# Patient Record
Sex: Female | Born: 1935 | Race: Black or African American | Hispanic: No | Smoking: Never smoker
Health system: Southern US, Community
[De-identification: ages and names within clinical notes are randomized; demographics above are authoritative.]

## PROBLEM LIST (undated history)

## (undated) DIAGNOSIS — J309 Allergic rhinitis, unspecified: Secondary | ICD-10-CM

## (undated) DIAGNOSIS — M1990 Unspecified osteoarthritis, unspecified site: Secondary | ICD-10-CM

## (undated) DIAGNOSIS — E119 Type 2 diabetes mellitus without complications: Secondary | ICD-10-CM

## (undated) DIAGNOSIS — K649 Unspecified hemorrhoids: Secondary | ICD-10-CM

## (undated) DIAGNOSIS — I2510 Atherosclerotic heart disease of native coronary artery without angina pectoris: Secondary | ICD-10-CM

## (undated) DIAGNOSIS — G609 Hereditary and idiopathic neuropathy, unspecified: Secondary | ICD-10-CM

## (undated) DIAGNOSIS — I739 Peripheral vascular disease, unspecified: Secondary | ICD-10-CM

## (undated) DIAGNOSIS — G5600 Carpal tunnel syndrome, unspecified upper limb: Secondary | ICD-10-CM

## (undated) DIAGNOSIS — I82409 Acute embolism and thrombosis of unspecified deep veins of unspecified lower extremity: Secondary | ICD-10-CM

## (undated) DIAGNOSIS — E785 Hyperlipidemia, unspecified: Secondary | ICD-10-CM

## (undated) DIAGNOSIS — Z86010 Personal history of colonic polyps: Secondary | ICD-10-CM

## (undated) DIAGNOSIS — G4700 Insomnia, unspecified: Secondary | ICD-10-CM

## (undated) DIAGNOSIS — D509 Iron deficiency anemia, unspecified: Secondary | ICD-10-CM

## (undated) DIAGNOSIS — I639 Cerebral infarction, unspecified: Secondary | ICD-10-CM

## (undated) DIAGNOSIS — E538 Deficiency of other specified B group vitamins: Secondary | ICD-10-CM

## (undated) DIAGNOSIS — Z947 Corneal transplant status: Secondary | ICD-10-CM

## (undated) DIAGNOSIS — I10 Essential (primary) hypertension: Secondary | ICD-10-CM

## (undated) DIAGNOSIS — E049 Nontoxic goiter, unspecified: Secondary | ICD-10-CM

## (undated) DIAGNOSIS — K5730 Diverticulosis of large intestine without perforation or abscess without bleeding: Secondary | ICD-10-CM

## (undated) HISTORY — DX: Essential (primary) hypertension: I10

## (undated) HISTORY — DX: Peripheral vascular disease, unspecified: I73.9

## (undated) HISTORY — DX: Allergic rhinitis, unspecified: J30.9

## (undated) HISTORY — DX: Atherosclerotic heart disease of native coronary artery without angina pectoris: I25.10

## (undated) HISTORY — DX: Personal history of colonic polyps: Z86.010

## (undated) HISTORY — DX: Unspecified hemorrhoids: K64.9

## (undated) HISTORY — DX: Hyperlipidemia, unspecified: E78.5

## (undated) HISTORY — DX: Diverticulosis of large intestine without perforation or abscess without bleeding: K57.30

## (undated) HISTORY — DX: Hereditary and idiopathic neuropathy, unspecified: G60.9

## (undated) HISTORY — PX: CATARACT EXTRACTION, BILATERAL: SHX1313

## (undated) HISTORY — PX: CARDIAC CATHETERIZATION: SHX172

## (undated) HISTORY — DX: Corneal transplant status: Z94.7

## (undated) HISTORY — PX: CHOLECYSTECTOMY: SHX55

## (undated) HISTORY — PX: APPENDECTOMY: SHX54

## (undated) HISTORY — DX: Insomnia, unspecified: G47.00

## (undated) HISTORY — DX: Iron deficiency anemia, unspecified: D50.9

## (undated) HISTORY — DX: Unspecified osteoarthritis, unspecified site: M19.90

## (undated) HISTORY — DX: Carpal tunnel syndrome, unspecified upper limb: G56.00

## (undated) HISTORY — DX: Deficiency of other specified B group vitamins: E53.8

## (undated) HISTORY — DX: Nontoxic goiter, unspecified: E04.9

---

## 1958-11-11 HISTORY — PX: ABDOMINAL HYSTERECTOMY: SHX81

## 1987-03-13 HISTORY — PX: BELOW KNEE LEG AMPUTATION: SUR23

## 1999-05-09 ENCOUNTER — Inpatient Hospital Stay (HOSPITAL_COMMUNITY): Admission: EM | Admit: 1999-05-09 | Discharge: 1999-05-10 | Payer: Self-pay | Admitting: Emergency Medicine

## 1999-07-04 ENCOUNTER — Ambulatory Visit: Admission: RE | Admit: 1999-07-04 | Discharge: 1999-07-04 | Payer: Self-pay | Admitting: Endocrinology

## 1999-07-12 ENCOUNTER — Encounter: Admission: RE | Admit: 1999-07-12 | Discharge: 1999-10-10 | Payer: Self-pay | Admitting: Internal Medicine

## 1999-11-28 ENCOUNTER — Encounter: Admission: RE | Admit: 1999-11-28 | Discharge: 2000-02-26 | Payer: Self-pay | Admitting: Orthopedic Surgery

## 2000-03-08 ENCOUNTER — Encounter: Admission: RE | Admit: 2000-03-08 | Discharge: 2000-04-22 | Payer: Self-pay | Admitting: Anesthesiology

## 2000-11-06 ENCOUNTER — Other Ambulatory Visit: Admission: RE | Admit: 2000-11-06 | Discharge: 2000-11-06 | Payer: Self-pay | Admitting: Gastroenterology

## 2000-11-06 ENCOUNTER — Encounter (INDEPENDENT_AMBULATORY_CARE_PROVIDER_SITE_OTHER): Payer: Self-pay | Admitting: *Deleted

## 2000-11-19 ENCOUNTER — Inpatient Hospital Stay (HOSPITAL_COMMUNITY): Admission: AD | Admit: 2000-11-19 | Discharge: 2000-11-22 | Payer: Self-pay | Admitting: Gastroenterology

## 2000-11-20 ENCOUNTER — Encounter: Payer: Self-pay | Admitting: Gastroenterology

## 2000-11-27 ENCOUNTER — Inpatient Hospital Stay (HOSPITAL_COMMUNITY): Admission: EM | Admit: 2000-11-27 | Discharge: 2000-12-03 | Payer: Self-pay

## 2000-11-27 ENCOUNTER — Encounter: Payer: Self-pay | Admitting: Internal Medicine

## 2000-11-28 ENCOUNTER — Encounter: Payer: Self-pay | Admitting: Internal Medicine

## 2000-11-30 ENCOUNTER — Encounter: Payer: Self-pay | Admitting: Internal Medicine

## 2000-12-02 ENCOUNTER — Encounter: Payer: Self-pay | Admitting: Internal Medicine

## 2001-01-07 ENCOUNTER — Encounter: Admission: RE | Admit: 2001-01-07 | Discharge: 2001-03-11 | Payer: Self-pay | Admitting: Orthopedic Surgery

## 2001-01-17 ENCOUNTER — Emergency Department (HOSPITAL_COMMUNITY): Admission: EM | Admit: 2001-01-17 | Discharge: 2001-01-17 | Payer: Self-pay | Admitting: *Deleted

## 2001-02-02 ENCOUNTER — Encounter: Payer: Self-pay | Admitting: Emergency Medicine

## 2001-02-02 ENCOUNTER — Encounter: Payer: Self-pay | Admitting: Internal Medicine

## 2001-02-02 ENCOUNTER — Inpatient Hospital Stay (HOSPITAL_COMMUNITY): Admission: EM | Admit: 2001-02-02 | Discharge: 2001-02-08 | Payer: Self-pay | Admitting: Emergency Medicine

## 2001-02-06 ENCOUNTER — Encounter: Payer: Self-pay | Admitting: Internal Medicine

## 2001-03-12 HISTORY — PX: CORONARY ARTERY BYPASS GRAFT: SHX141

## 2001-06-01 ENCOUNTER — Inpatient Hospital Stay (HOSPITAL_COMMUNITY): Admission: EM | Admit: 2001-06-01 | Discharge: 2001-07-02 | Payer: Self-pay | Admitting: *Deleted

## 2001-06-01 ENCOUNTER — Encounter: Payer: Self-pay | Admitting: *Deleted

## 2001-06-10 ENCOUNTER — Encounter: Payer: Self-pay | Admitting: Cardiology

## 2001-06-13 ENCOUNTER — Encounter: Payer: Self-pay | Admitting: Thoracic Surgery (Cardiothoracic Vascular Surgery)

## 2001-06-17 ENCOUNTER — Encounter: Payer: Self-pay | Admitting: Thoracic Surgery (Cardiothoracic Vascular Surgery)

## 2001-06-18 ENCOUNTER — Encounter: Payer: Self-pay | Admitting: Thoracic Surgery (Cardiothoracic Vascular Surgery)

## 2001-06-19 ENCOUNTER — Encounter: Payer: Self-pay | Admitting: Thoracic Surgery (Cardiothoracic Vascular Surgery)

## 2001-06-20 ENCOUNTER — Encounter: Payer: Self-pay | Admitting: Thoracic Surgery (Cardiothoracic Vascular Surgery)

## 2001-06-21 ENCOUNTER — Encounter: Payer: Self-pay | Admitting: Thoracic Surgery (Cardiothoracic Vascular Surgery)

## 2001-06-21 ENCOUNTER — Encounter: Payer: Self-pay | Admitting: Cardiothoracic Surgery

## 2001-06-22 ENCOUNTER — Encounter: Payer: Self-pay | Admitting: Cardiothoracic Surgery

## 2001-06-23 ENCOUNTER — Encounter: Payer: Self-pay | Admitting: Cardiothoracic Surgery

## 2001-06-24 ENCOUNTER — Encounter: Payer: Self-pay | Admitting: Cardiothoracic Surgery

## 2001-06-25 ENCOUNTER — Encounter: Payer: Self-pay | Admitting: Thoracic Surgery (Cardiothoracic Vascular Surgery)

## 2001-06-29 ENCOUNTER — Encounter: Payer: Self-pay | Admitting: Thoracic Surgery (Cardiothoracic Vascular Surgery)

## 2001-08-22 ENCOUNTER — Inpatient Hospital Stay (HOSPITAL_COMMUNITY)
Admission: EM | Admit: 2001-08-22 | Discharge: 2001-09-03 | Payer: Self-pay | Admitting: Thoracic Surgery (Cardiothoracic Vascular Surgery)

## 2004-03-31 ENCOUNTER — Ambulatory Visit: Payer: Self-pay | Admitting: Endocrinology

## 2004-05-22 ENCOUNTER — Ambulatory Visit: Payer: Self-pay | Admitting: Endocrinology

## 2004-05-27 ENCOUNTER — Emergency Department (HOSPITAL_COMMUNITY): Admission: EM | Admit: 2004-05-27 | Discharge: 2004-05-27 | Payer: Self-pay | Admitting: Emergency Medicine

## 2004-08-16 ENCOUNTER — Ambulatory Visit: Payer: Self-pay | Admitting: Endocrinology

## 2004-08-21 ENCOUNTER — Observation Stay (HOSPITAL_COMMUNITY): Admission: EM | Admit: 2004-08-21 | Discharge: 2004-08-23 | Payer: Self-pay | Admitting: Emergency Medicine

## 2004-08-22 ENCOUNTER — Ambulatory Visit: Payer: Self-pay | Admitting: Internal Medicine

## 2004-09-06 ENCOUNTER — Ambulatory Visit: Payer: Self-pay | Admitting: Endocrinology

## 2004-10-12 ENCOUNTER — Ambulatory Visit: Payer: Self-pay | Admitting: Internal Medicine

## 2004-12-27 ENCOUNTER — Ambulatory Visit: Payer: Self-pay | Admitting: Endocrinology

## 2005-01-03 ENCOUNTER — Ambulatory Visit: Payer: Self-pay | Admitting: Endocrinology

## 2005-02-14 ENCOUNTER — Ambulatory Visit: Payer: Self-pay | Admitting: Endocrinology

## 2005-04-03 ENCOUNTER — Ambulatory Visit: Payer: Self-pay | Admitting: Endocrinology

## 2005-07-05 ENCOUNTER — Ambulatory Visit: Payer: Self-pay | Admitting: Internal Medicine

## 2005-10-04 ENCOUNTER — Ambulatory Visit: Payer: Self-pay | Admitting: Internal Medicine

## 2005-10-12 ENCOUNTER — Encounter: Payer: Self-pay | Admitting: Cardiology

## 2005-10-12 ENCOUNTER — Ambulatory Visit: Payer: Self-pay

## 2005-11-29 ENCOUNTER — Ambulatory Visit: Payer: Self-pay | Admitting: Internal Medicine

## 2005-12-21 ENCOUNTER — Ambulatory Visit: Payer: Self-pay | Admitting: Endocrinology

## 2006-03-19 ENCOUNTER — Ambulatory Visit: Payer: Self-pay | Admitting: Endocrinology

## 2006-03-19 LAB — CONVERTED CEMR LAB
BUN: 18 mg/dL (ref 6–23)
CO2: 23 meq/L (ref 19–32)
Chloride: 108 meq/L (ref 96–112)
Cholesterol: 127 mg/dL (ref 0–200)
Creatinine, Ser: 1.4 mg/dL — ABNORMAL HIGH (ref 0.4–1.2)
Creatinine,U: 157.3 mg/dL
GFR calc non Af Amer: 39 mL/min
Glomerular Filtration Rate, Af Am: 47 mL/min/{1.73_m2}
HDL: 52.1 mg/dL (ref >39.0)
Hemoglobin, Urine: NEGATIVE
Hemoglobin: 13.5 g/dL (ref 12.0–15.0)
Hgb A1c MFr Bld: 7.1 % — ABNORMAL HIGH (ref 4.6–6.0)
MCV: 93.1 fL (ref 78.0–100.0)
Microalb Creat Ratio: 57.9 mg/g — ABNORMAL HIGH (ref 0.0–30.0)
Platelets: 209 10*3/uL (ref 150–400)
RBC: 4.35 M/uL (ref 3.87–5.11)
Saturation Ratios: 16.6 % — ABNORMAL LOW (ref 20.0–50.0)
Sodium: 142 meq/L (ref 135–145)
TSH: 1.79 microintl units/mL (ref 0.35–5.50)
Triglyceride fasting, serum: 112 mg/dL (ref 0–149)
Urobilinogen, UA: 0.2 (ref 0.0–1.0)
VLDL: 22 mg/dL (ref 0–40)

## 2006-05-13 ENCOUNTER — Ambulatory Visit: Payer: Self-pay | Admitting: Internal Medicine

## 2006-05-22 ENCOUNTER — Ambulatory Visit: Payer: Self-pay | Admitting: Internal Medicine

## 2006-06-27 ENCOUNTER — Ambulatory Visit: Payer: Self-pay | Admitting: Endocrinology

## 2006-07-31 ENCOUNTER — Ambulatory Visit: Payer: Self-pay | Admitting: Endocrinology

## 2006-09-30 ENCOUNTER — Ambulatory Visit: Payer: Self-pay | Admitting: Gastroenterology

## 2006-10-28 ENCOUNTER — Ambulatory Visit: Payer: Self-pay | Admitting: Endocrinology

## 2006-11-13 ENCOUNTER — Ambulatory Visit: Payer: Self-pay | Admitting: Gastroenterology

## 2006-11-13 ENCOUNTER — Encounter: Payer: Self-pay | Admitting: Gastroenterology

## 2006-11-13 LAB — HM COLONOSCOPY

## 2006-12-30 ENCOUNTER — Encounter: Payer: Self-pay | Admitting: Endocrinology

## 2006-12-30 DIAGNOSIS — G5600 Carpal tunnel syndrome, unspecified upper limb: Secondary | ICD-10-CM

## 2006-12-30 DIAGNOSIS — Z860100 Personal history of colon polyps, unspecified: Secondary | ICD-10-CM

## 2006-12-30 DIAGNOSIS — K5730 Diverticulosis of large intestine without perforation or abscess without bleeding: Secondary | ICD-10-CM | POA: Insufficient documentation

## 2006-12-30 DIAGNOSIS — I2510 Atherosclerotic heart disease of native coronary artery without angina pectoris: Secondary | ICD-10-CM

## 2006-12-30 DIAGNOSIS — Z947 Corneal transplant status: Secondary | ICD-10-CM | POA: Insufficient documentation

## 2006-12-30 DIAGNOSIS — I739 Peripheral vascular disease, unspecified: Secondary | ICD-10-CM

## 2006-12-30 DIAGNOSIS — E049 Nontoxic goiter, unspecified: Secondary | ICD-10-CM

## 2006-12-30 DIAGNOSIS — I10 Essential (primary) hypertension: Secondary | ICD-10-CM

## 2006-12-30 DIAGNOSIS — E785 Hyperlipidemia, unspecified: Secondary | ICD-10-CM

## 2006-12-30 DIAGNOSIS — H269 Unspecified cataract: Secondary | ICD-10-CM

## 2006-12-30 DIAGNOSIS — G609 Hereditary and idiopathic neuropathy, unspecified: Secondary | ICD-10-CM

## 2006-12-30 DIAGNOSIS — S88119A Complete traumatic amputation at level between knee and ankle, unspecified lower leg, initial encounter: Secondary | ICD-10-CM | POA: Insufficient documentation

## 2006-12-30 DIAGNOSIS — J309 Allergic rhinitis, unspecified: Secondary | ICD-10-CM

## 2006-12-30 DIAGNOSIS — K649 Unspecified hemorrhoids: Secondary | ICD-10-CM

## 2006-12-30 HISTORY — DX: Unspecified hemorrhoids: K64.9

## 2006-12-30 HISTORY — DX: Allergic rhinitis, unspecified: J30.9

## 2006-12-30 HISTORY — DX: Corneal transplant status: Z94.7

## 2006-12-30 HISTORY — DX: Hyperlipidemia, unspecified: E78.5

## 2006-12-30 HISTORY — DX: Carpal tunnel syndrome, unspecified upper limb: G56.00

## 2006-12-30 HISTORY — DX: Diverticulosis of large intestine without perforation or abscess without bleeding: K57.30

## 2006-12-30 HISTORY — DX: Personal history of colonic polyps: Z86.010

## 2006-12-30 HISTORY — DX: Nontoxic goiter, unspecified: E04.9

## 2006-12-30 HISTORY — DX: Personal history of colon polyps, unspecified: Z86.0100

## 2006-12-30 HISTORY — DX: Essential (primary) hypertension: I10

## 2006-12-30 HISTORY — DX: Peripheral vascular disease, unspecified: I73.9

## 2006-12-30 HISTORY — DX: Hereditary and idiopathic neuropathy, unspecified: G60.9

## 2006-12-30 HISTORY — DX: Atherosclerotic heart disease of native coronary artery without angina pectoris: I25.10

## 2007-01-09 ENCOUNTER — Ambulatory Visit: Payer: Self-pay | Admitting: Internal Medicine

## 2007-01-16 ENCOUNTER — Emergency Department (HOSPITAL_COMMUNITY): Admission: EM | Admit: 2007-01-16 | Discharge: 2007-01-16 | Payer: Self-pay | Admitting: Emergency Medicine

## 2007-02-10 ENCOUNTER — Ambulatory Visit: Payer: Self-pay | Admitting: Endocrinology

## 2007-02-10 DIAGNOSIS — M1990 Unspecified osteoarthritis, unspecified site: Secondary | ICD-10-CM | POA: Insufficient documentation

## 2007-02-10 HISTORY — DX: Unspecified osteoarthritis, unspecified site: M19.90

## 2007-02-27 ENCOUNTER — Ambulatory Visit: Payer: Self-pay | Admitting: Endocrinology

## 2007-02-28 LAB — CONVERTED CEMR LAB
BUN: 21 mg/dL (ref 6–23)
Bilirubin, Direct: 0.3 mg/dL (ref 0.0–0.3)
CO2: 26 meq/L (ref 19–32)
Calcium: 9.7 mg/dL (ref 8.4–10.5)
Chloride: 109 meq/L (ref 96–112)
Cholesterol: 124 mg/dL (ref 0–200)
Creatinine,U: 41.1 mg/dL
Crystals: NEGATIVE
Eosinophils Absolute: 0.1 10*3/uL (ref 0.0–0.6)
GFR calc Af Amer: 51 mL/min
HCT: 34.7 % — ABNORMAL LOW (ref 36.0–46.0)
Hemoglobin, Urine: NEGATIVE
Ketones, ur: NEGATIVE mg/dL
LDL Cholesterol: 60 mg/dL (ref 0–99)
Microalb Creat Ratio: 46.2 mg/g — ABNORMAL HIGH (ref 0.0–30.0)
Monocytes Absolute: 0.5 10*3/uL (ref 0.2–0.7)
Monocytes Relative: 6.1 % (ref 3.0–11.0)
Mucus, UA: NEGATIVE
Neutro Abs: 5.2 10*3/uL (ref 1.4–7.7)
RBC / HPF: NONE SEEN
TSH: 1.27 microintl units/mL (ref 0.35–5.50)
Total CHOL/HDL Ratio: 2.5
Total Protein, Urine: NEGATIVE mg/dL
Total Protein: 6.8 g/dL (ref 6.0–8.3)
Triglycerides: 72 mg/dL (ref 0–149)
Urine Glucose: NEGATIVE mg/dL
Urobilinogen, UA: 0.2 (ref 0.0–1.0)
WBC, UA: NONE SEEN cells/hpf

## 2007-04-07 ENCOUNTER — Encounter: Payer: Self-pay | Admitting: Endocrinology

## 2007-07-10 ENCOUNTER — Ambulatory Visit: Payer: Self-pay | Admitting: Internal Medicine

## 2007-08-05 ENCOUNTER — Ambulatory Visit: Payer: Self-pay | Admitting: Internal Medicine

## 2007-08-05 ENCOUNTER — Ambulatory Visit: Payer: Self-pay

## 2007-08-05 LAB — CONVERTED CEMR LAB
AST: 34 units/L (ref 0–37)
Cholesterol: 99 mg/dL (ref 0–200)
Total CHOL/HDL Ratio: 2.8

## 2007-09-29 ENCOUNTER — Telehealth (INDEPENDENT_AMBULATORY_CARE_PROVIDER_SITE_OTHER): Payer: Self-pay | Admitting: *Deleted

## 2007-10-31 ENCOUNTER — Encounter: Payer: Self-pay | Admitting: Endocrinology

## 2008-04-29 ENCOUNTER — Ambulatory Visit: Payer: Self-pay | Admitting: Endocrinology

## 2008-05-31 ENCOUNTER — Encounter: Payer: Self-pay | Admitting: Endocrinology

## 2008-06-17 ENCOUNTER — Ambulatory Visit: Payer: Self-pay | Admitting: Internal Medicine

## 2008-06-17 ENCOUNTER — Encounter: Payer: Self-pay | Admitting: Endocrinology

## 2008-07-05 ENCOUNTER — Encounter: Payer: Self-pay | Admitting: Endocrinology

## 2008-08-05 ENCOUNTER — Encounter: Payer: Self-pay | Admitting: Internal Medicine

## 2008-08-05 ENCOUNTER — Ambulatory Visit: Payer: Self-pay

## 2008-09-16 ENCOUNTER — Ambulatory Visit: Payer: Self-pay | Admitting: Endocrinology

## 2008-09-16 DIAGNOSIS — E538 Deficiency of other specified B group vitamins: Secondary | ICD-10-CM | POA: Insufficient documentation

## 2008-09-16 HISTORY — DX: Deficiency of other specified B group vitamins: E53.8

## 2008-09-16 LAB — CONVERTED CEMR LAB
ALT: 27 units/L (ref 0–35)
Alkaline Phosphatase: 83 units/L (ref 39–117)
BUN: 15 mg/dL (ref 6–23)
Basophils Absolute: 0 10*3/uL (ref 0.0–0.1)
Basophils Relative: 0.7 % (ref 0.0–3.0)
Bilirubin Urine: NEGATIVE
Bilirubin, Direct: 0.2 mg/dL (ref 0.0–0.3)
Chloride: 107 meq/L (ref 96–112)
Creatinine, Ser: 1.2 mg/dL (ref 0.4–1.2)
Eosinophils Absolute: 0 10*3/uL (ref 0.0–0.7)
Folate: >20 ng/mL
GFR calc non Af Amer: 55.58 mL/min (ref >60)
Glucose, Bld: 249 mg/dL — ABNORMAL HIGH (ref 70–99)
Hemoglobin, Urine: NEGATIVE
Hemoglobin: 13 g/dL (ref 12.0–15.0)
Iron: 61 ug/dL (ref 42–145)
Ketones, ur: NEGATIVE mg/dL
LDL Cholesterol: 58 mg/dL (ref 0–99)
Leukocytes, UA: NEGATIVE
Lymphocytes Relative: 19.2 % (ref 12.0–46.0)
MCV: 91.3 fL (ref 78.0–100.0)
Microalb Creat Ratio: 37.7 mg/g — ABNORMAL HIGH (ref 0.0–30.0)
Microalb, Ur: 1 mg/dL (ref 0.0–1.9)
Monocytes Absolute: 0.4 10*3/uL (ref 0.1–1.0)
Neutrophils Relative %: 73.6 % (ref 43.0–77.0)
Nitrite: NEGATIVE
RDW: 14.1 % (ref 11.5–14.6)
Sodium: 142 meq/L (ref 135–145)
Total CHOL/HDL Ratio: 2
Total Protein: 7.4 g/dL (ref 6.0–8.3)
Urobilinogen, UA: 0.2 (ref 0.0–1.0)
VLDL: 13 mg/dL (ref 0.0–40.0)

## 2008-09-27 ENCOUNTER — Ambulatory Visit: Payer: Self-pay | Admitting: Endocrinology

## 2008-10-28 ENCOUNTER — Ambulatory Visit: Payer: Self-pay | Admitting: Endocrinology

## 2008-11-09 ENCOUNTER — Encounter: Payer: Self-pay | Admitting: Endocrinology

## 2008-12-01 ENCOUNTER — Ambulatory Visit: Payer: Self-pay | Admitting: Endocrinology

## 2008-12-31 ENCOUNTER — Ambulatory Visit: Payer: Self-pay | Admitting: Internal Medicine

## 2009-01-24 ENCOUNTER — Telehealth (INDEPENDENT_AMBULATORY_CARE_PROVIDER_SITE_OTHER): Payer: Self-pay | Admitting: *Deleted

## 2009-04-12 ENCOUNTER — Ambulatory Visit: Payer: Self-pay | Admitting: Endocrinology

## 2009-04-12 ENCOUNTER — Telehealth: Payer: Self-pay | Admitting: Endocrinology

## 2009-04-12 DIAGNOSIS — K769 Liver disease, unspecified: Secondary | ICD-10-CM | POA: Insufficient documentation

## 2009-04-12 LAB — CONVERTED CEMR LAB
AST: 43 units/L — ABNORMAL HIGH (ref 0–37)
Bilirubin, Direct: 0.3 mg/dL (ref 0.0–0.3)
HCV Ab: NEGATIVE
Total Protein: 6.6 g/dL (ref 6.0–8.3)

## 2009-04-14 ENCOUNTER — Encounter: Payer: Self-pay | Admitting: Endocrinology

## 2009-04-15 ENCOUNTER — Encounter: Payer: Self-pay | Admitting: Endocrinology

## 2009-06-02 ENCOUNTER — Ambulatory Visit: Payer: Self-pay | Admitting: Endocrinology

## 2009-06-24 ENCOUNTER — Telehealth: Payer: Self-pay | Admitting: Endocrinology

## 2009-07-08 ENCOUNTER — Ambulatory Visit: Payer: Self-pay | Admitting: Endocrinology

## 2009-07-13 ENCOUNTER — Encounter: Payer: Self-pay | Admitting: Endocrinology

## 2009-08-18 ENCOUNTER — Emergency Department (HOSPITAL_COMMUNITY): Admission: EM | Admit: 2009-08-18 | Discharge: 2009-08-18 | Payer: Self-pay | Admitting: Emergency Medicine

## 2009-08-26 IMAGING — CT CT PELVIS W/ CM
1 of 3 series · 12 of 32 positions shown, 17 images · IV contrast (omnipaque)
Comparison: Abdominal radiographs 01/16/07.

CLINICAL DATA: 80-year-old with abdominal and pelvic pain.
ABDOMEN CT WITH CONTRAST:
TECHNIQUE: Multidetector CT imaging of the abdomen was performed following the standard protocol during bolus administration of intravenous contrast.
Contrast:   125 cc Omnipaque 300.
TECHNIQUE: Multidetector CT imaging of the pelvis was performed following the standard protocol during bolus administration of intravenous contrast.

[Series 2: abd_pel_xxl 5.0 b20s st · axial · 0.80mm/px · z∈[-464,-14]mm · 12 of 102 slices shown, 17 images]
[im 6/102  soft-tissue]
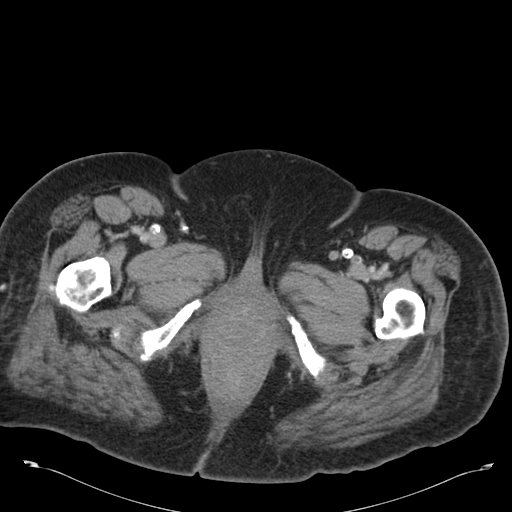
[im 6/102  bone]
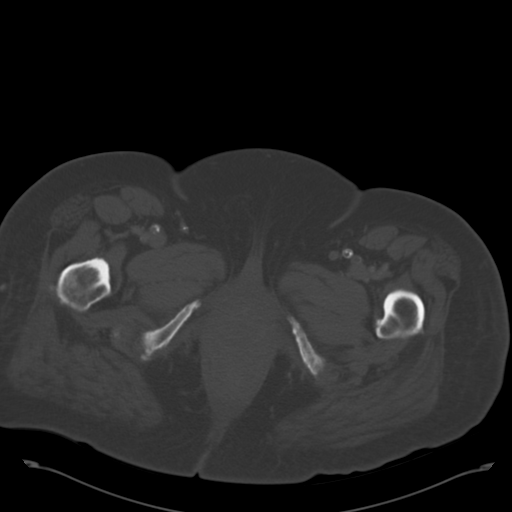
[im 17/102  soft-tissue]
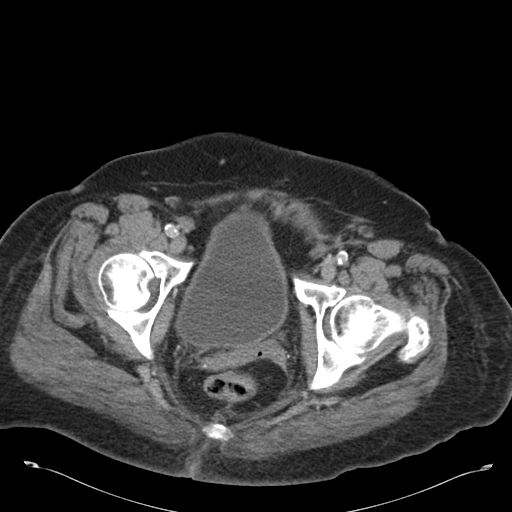
[im 23/102  soft-tissue]
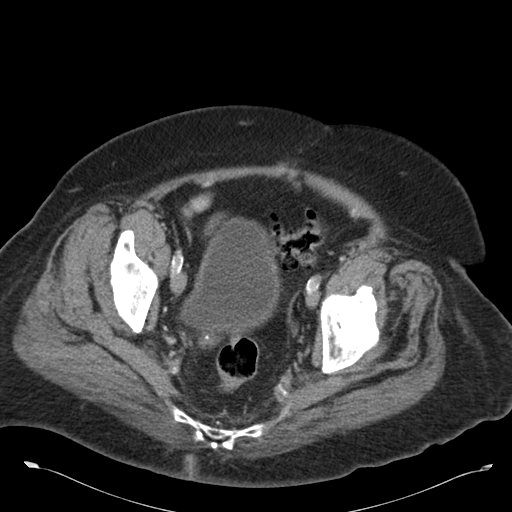
[im 34/102  soft-tissue]
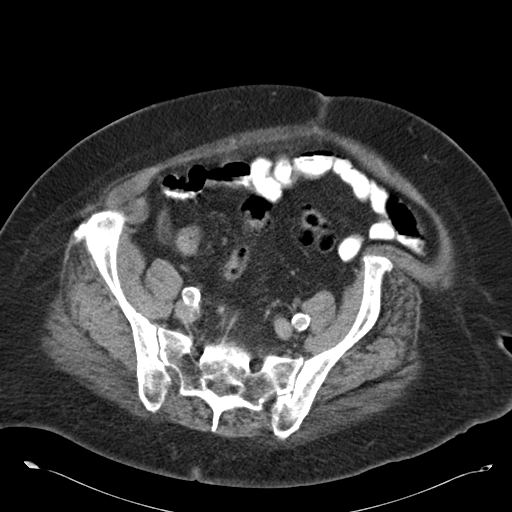
[im 40/102  soft-tissue]
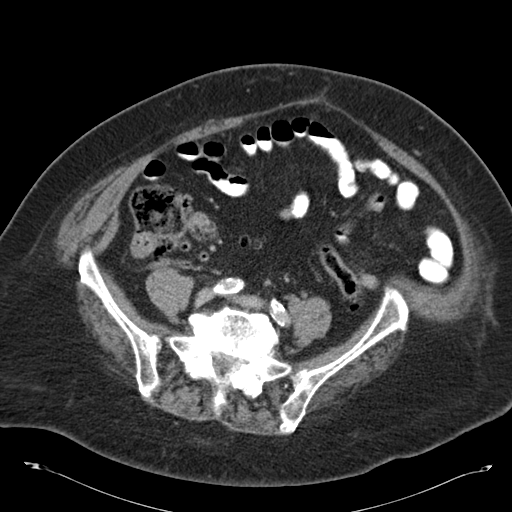
[im 51/102  soft-tissue]
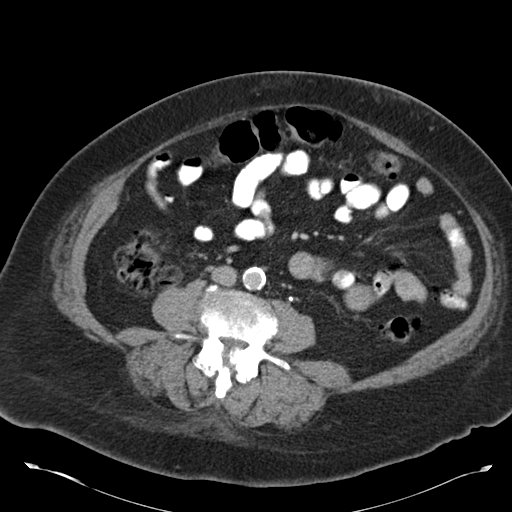
[im 62/102  soft-tissue]
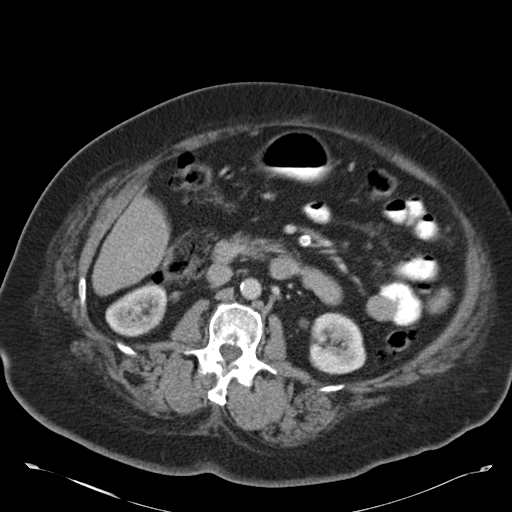
[im 68/102  soft-tissue]
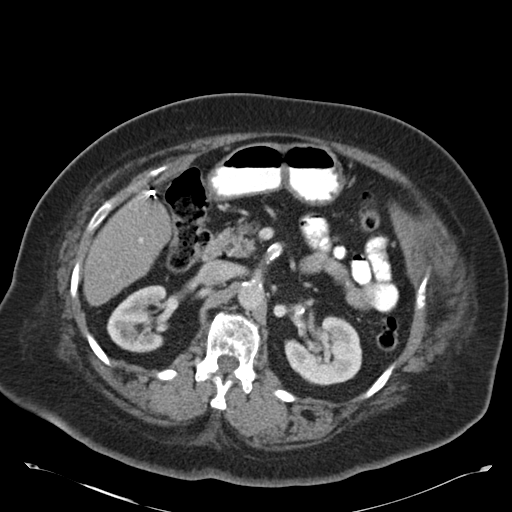
[im 79/102  soft-tissue]
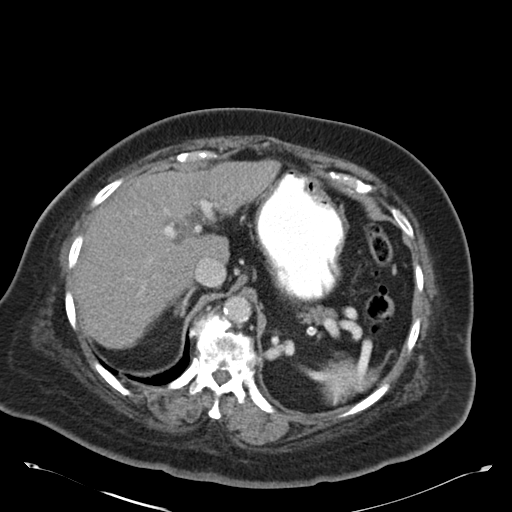
[im 79/102  lung]
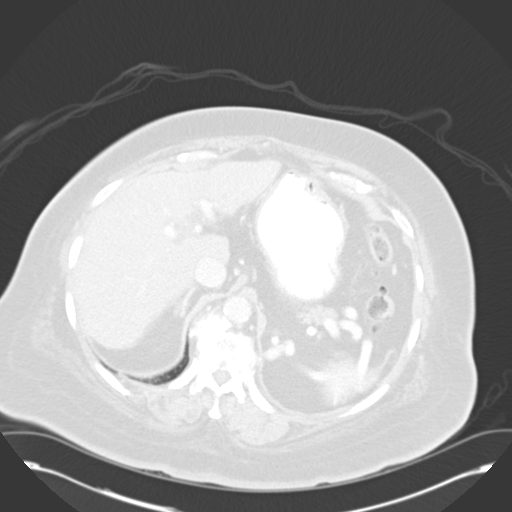
[im 79/102  bone]
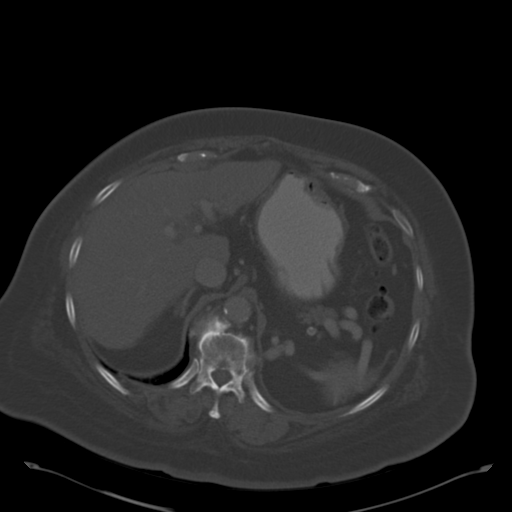
[im 85/102  soft-tissue]
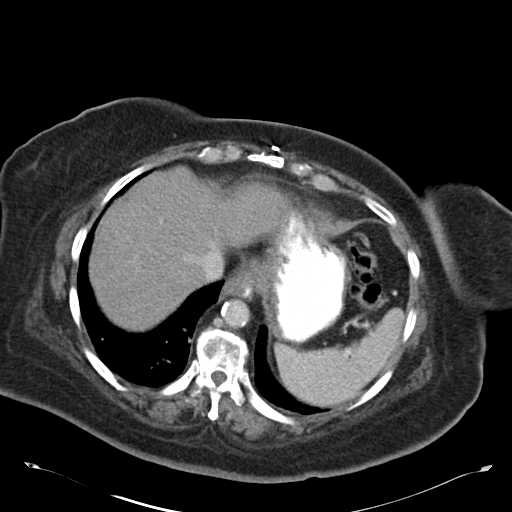
[im 85/102  lung]
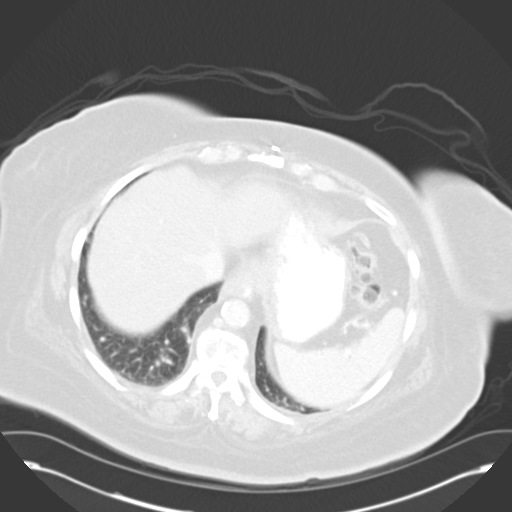
[im 90/102  lung]
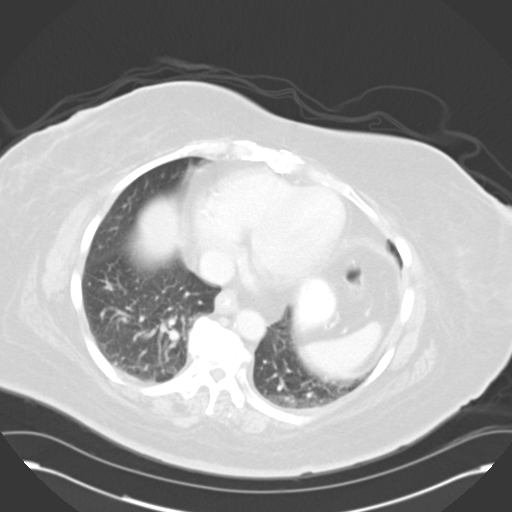
[im 96/102  soft-tissue]
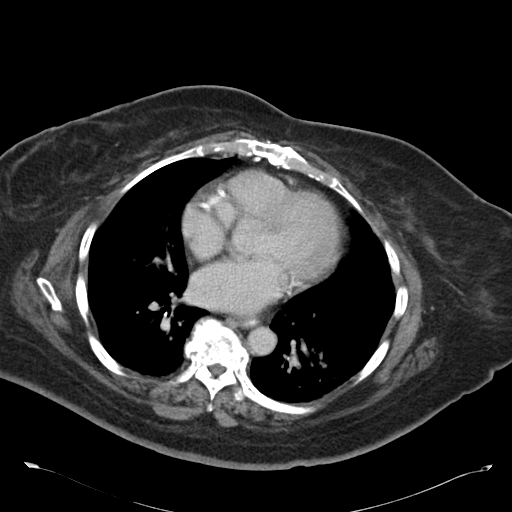
[im 96/102  lung]
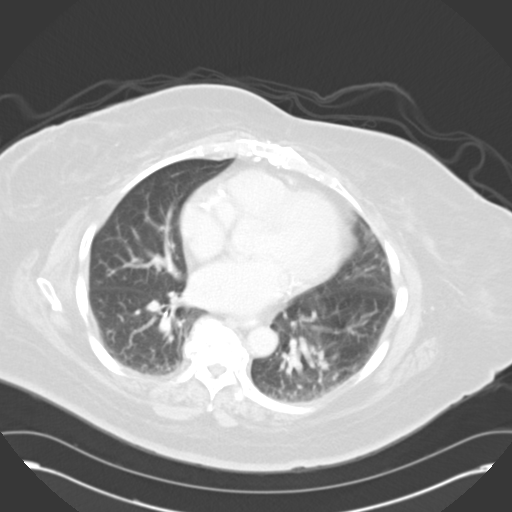

[12 of 32 positions shown; findings below may reference images not displayed]

FINDINGS: The lung bases show changes in median sternotomy for CABG.  Scattered areas of bibasilar atelectasis. 
The patient is status post cholecystectomy.  Mild prominence of central biliary tree is most likely related to postcholecystectomy state.  No focal liver is identified. 
The spleen, adrenal glands, kidneys are normal.  There is mild pancreatic atrophy however no focal pancreatic abnormalities are identified.  There is scattered atherosclerotic calcification of the abdominal aorta without aneurysm.  Calcifications are identified in the celiac axis, SMA and inferior mesenteric artery but contrast is seen within the lumen of these vessels.  
Both kidneys are normal in enhancement, without evidence of mass, hydronephrosis or stone.  On delayed images, both kidneys secrete contrast symmetrically.  
Bowel loops are nondistended and there is no evidence of bowel wall thickening in the abdomen.  There are numerous colonic diverticula without definite evidence of focal inflammatory change. 
Just superior to the patient?s umbilicus in the mid anterior abdominal wall, there is stranding in the subcutaneous fat that spans approximately 17 cm in a band across the anterior abdomen.  No focal fluid collection is identified within the soft tissues at this level.
IMPRESSION: 1.  Status post cholecystectomy. 
2.  Inflammatory changes with stranding in the subcutaneous fat of the anterior abdominal wall.  Please correlate with any history of recent trauma to this region, as this could represent bruising.  Another consideration includes cellulitis.  
3.  Status post cholecystectomy with mild prominence of the central biliary tree, likely related to postcholecystectomy state.  
4.  Marked atherosclerosis of the abdominal aorta and branch vessel without evidence of aneurysm. 
5.  Extensive colonic diverticulosis. 
PELVIS CT WITH CONTRAST:
FINDINGS: The appendix is identified and is normal in caliber and air-filled, without evidence of acute inflammatory change.  There is some colonization of the terminal ileum with associated diverticular formation of the terminal ileum, raising the question an element of chronic constipation.  There are no focal inflammatory changes in the region of the terminal ileum or its diverticula.  Extensive sigmoid colonic diverticulosis is noted without discrete evidence of diverticulitis.  No free pelvic fluid or abscess is identified.  The urinary bladder is unremarkable.  There is extensive atherosclerotic calcification of the iliac and proximal femoral vasculature bilaterally.  No definite contrast is seen within the proximal left superficial femoral artery, raising the question of complete or partial occlusive peripheral vascular disease.  There are marked degenerative changes of the lower thoracic spine and the L5-S1 level.  The uterus is surgically absent.
IMPRESSION: 1.  Extensive atherosclerotic calcification of the iliac vasculature.  No definite contrast is seen within the proximal left superficial femoral artery.  This suggests marked stenosis vs occlusion. 
2.  Extensive sigmoid colonic diverticulosis.  No discrete diverticulitis is identified.
3.  Colonization of the terminal ileum.  This can be seen if the patient has a history of chronic constipation.  Currently, there is not a large burden of stool in the colon.

## 2009-08-29 ENCOUNTER — Telehealth: Payer: Self-pay | Admitting: Endocrinology

## 2009-09-01 ENCOUNTER — Ambulatory Visit: Payer: Self-pay | Admitting: Endocrinology

## 2009-09-01 DIAGNOSIS — R609 Edema, unspecified: Secondary | ICD-10-CM | POA: Insufficient documentation

## 2009-09-13 LAB — CONVERTED CEMR LAB
AST: 32 units/L (ref 0–37)
Alkaline Phosphatase: 78 units/L (ref 39–117)
BUN: 47 mg/dL — ABNORMAL HIGH (ref 6–23)
CO2: 24 meq/L (ref 19–32)
Chloride: 111 meq/L (ref 96–112)
Creatinine, Ser: 1.9 mg/dL — ABNORMAL HIGH (ref 0.4–1.2)
Eosinophils Absolute: 0.1 10*3/uL (ref 0.0–0.7)
Eosinophils Relative: 1.6 % (ref 0.0–5.0)
HDL: 35 mg/dL — ABNORMAL LOW (ref >39.00)
Lymphocytes Relative: 16.4 % (ref 12.0–46.0)
Lymphs Abs: 1.5 10*3/uL (ref 0.7–4.0)
Monocytes Absolute: 0.9 10*3/uL (ref 0.1–1.0)
Monocytes Relative: 10 % (ref 3.0–12.0)
Neutro Abs: 6.5 10*3/uL (ref 1.4–7.7)
Platelets: 266 10*3/uL (ref 150.0–400.0)
Pro B Natriuretic peptide (BNP): 68.5 pg/mL (ref 0.0–100.0)
Saturation Ratios: 19.2 % — ABNORMAL LOW (ref 20.0–50.0)
Sodium: 141 meq/L (ref 135–145)
Total Bilirubin: 1.1 mg/dL (ref 0.3–1.2)
Total CHOL/HDL Ratio: 3
Triglycerides: 94 mg/dL (ref 0.0–149.0)

## 2009-10-21 ENCOUNTER — Ambulatory Visit: Payer: Self-pay | Admitting: Endocrinology

## 2009-10-21 DIAGNOSIS — D509 Iron deficiency anemia, unspecified: Secondary | ICD-10-CM

## 2009-10-21 DIAGNOSIS — G4700 Insomnia, unspecified: Secondary | ICD-10-CM

## 2009-10-21 HISTORY — DX: Iron deficiency anemia, unspecified: D50.9

## 2009-10-21 HISTORY — DX: Insomnia, unspecified: G47.00

## 2009-11-01 ENCOUNTER — Telehealth: Payer: Self-pay | Admitting: Endocrinology

## 2009-11-03 ENCOUNTER — Ambulatory Visit: Payer: Self-pay | Admitting: Endocrinology

## 2009-11-04 LAB — CONVERTED CEMR LAB
OCCULT 1: NEGATIVE
OCCULT 4: NEGATIVE
OCCULT 5: NEGATIVE

## 2009-12-12 ENCOUNTER — Ambulatory Visit: Payer: Self-pay | Admitting: Endocrinology

## 2009-12-19 ENCOUNTER — Encounter: Payer: Self-pay | Admitting: Endocrinology

## 2009-12-21 LAB — CONVERTED CEMR LAB
ALT: 16 units/L (ref 0–35)
Albumin: 3 g/dL — ABNORMAL LOW (ref 3.5–5.2)
Basophils Absolute: 0 10*3/uL (ref 0.0–0.1)
Bilirubin Urine: NEGATIVE
Bilirubin, Direct: 0.1 mg/dL (ref 0.0–0.3)
Calcium: 9.1 mg/dL (ref 8.4–10.5)
Cholesterol: 120 mg/dL (ref 0–200)
Eosinophils Absolute: 0.1 10*3/uL (ref 0.0–0.7)
Eosinophils Relative: 1.2 % (ref 0.0–5.0)
Glucose, Bld: 170 mg/dL — ABNORMAL HIGH (ref 70–99)
HCT: 34.2 % — ABNORMAL LOW (ref 36.0–46.0)
Hgb A1c MFr Bld: 5.8 % (ref 4.6–6.5)
LDL Cholesterol: 54 mg/dL (ref 0–99)
Lymphocytes Relative: 17.8 % (ref 12.0–46.0)
MCHC: 34.7 g/dL (ref 30.0–36.0)
MCV: 92.2 fL (ref 78.0–100.0)
Neutrophils Relative %: 73.2 % (ref 43.0–77.0)
Platelets: 200 10*3/uL (ref 150.0–400.0)
Saturation Ratios: 20 % (ref 20.0–50.0)
Total CHOL/HDL Ratio: 3
Total Protein: 6.2 g/dL (ref 6.0–8.3)
Transferrin: 218 mg/dL (ref 212.0–360.0)
Triglycerides: 93 mg/dL (ref 0.0–149.0)
Urobilinogen, UA: 0.2 (ref 0.0–1.0)
VLDL: 18.6 mg/dL (ref 0.0–40.0)

## 2010-04-11 NOTE — Medication Information (Signed)
Summary: Humalog Approved/[employer]  Humalog Approved/[employer]   Imported By: Sherian Rein 05/05/2009 10:19:18  _____________________________________________________________________  External Attachment:    Type:   Image     Comment:   External Document

## 2010-04-11 NOTE — Progress Notes (Signed)
Summary: Call Report  Phone Note Other Incoming   Caller: [facility] Call Report Summary of Call: [facility] Triage Call Report Triage Record Num: [id number] Operator: Hillary Bow Patient Name: Angelica Lawrence Call Date & Time: 06/23/2009 5:51:32PM Patient Phone: PCP: [name] Patient Gender: Female PCP Fax : [phone] Patient DOB: [birth year] Practice Name: [facility] Reason for Call: Pt. calling about not feeling well after taking [place], newly prescribed medication, complaining of burping, passing gas and bloating. Onset 1 month ago, 05-23-09 approx. Afebrile. Last BM was 06-23-09. Denies vomiting, diarrhea, and abdominal pain. Home care advice given. Instructed pt. to call office back if not feeling better in a couple of days for an appt. Protocol(s) Used: Abdominal Pain / Discomfort Protocol(s) Used: Abdominal Swelling, Bloating Recommended Outcome per Protocol: Provide Home/Self Care Reason for Outcome: New or worsening abdominal distension / bloating Excessive or unusually odorous flatulance Care Advice: Consider the use of an OTC antacid (such as Maalox, Mylanta, Riopan, Gelusil or Gaviscon) or a histamine antagonist (such as Tagamet, Zantac, Pepcid or Axid) as directed by the product label or a pharmacist and if not contraindicated by existing conditions.  ~ Follow previous advice of provider for same problem; if symptoms don't improve or if symptoms recur, or new symptoms develop, call provider immediately.  ~ Heartburn Relief (Dietary): - Avoid overeating. - Eat smaller, more frequent meals and chew each bite thoroughly. - Avoid high fat, spicy or gas-producing foods. - Limit liquids/foods that are gastric irritants (fruit juices, caffeinated, carbonated or alcoholic beverages, chocolate and dairy products). - Eat at least three hours before bedtime.  ~  ~ SYMPTOM / CONDITION MANAGEMENT  ~ CAUTIONS 06/23/2009 6:21:41PM Page 1 of 1 CAN_TriageRpt Initial  call taken by: Margaret Pyle, CMA,  June 24, 2009 9:56 AM

## 2010-04-11 NOTE — Medication Information (Signed)
Summary: Humalog approved 1 year/[employer] [address]  Humalog approved 1 year/[employer] [place] [employer] [place]   Imported By: Lester Lebam 04/18/2009 08:29:49  _____________________________________________________________________  External Attachment:    Type:   Image     Comment:   External Document

## 2010-04-11 NOTE — Assessment & Plan Note (Signed)
Summary: B-12/ SAE/NWS   Nurse Visit   Allergies: No Known Drug Allergies  Medication Administration  Injection # 1:    Medication: Vit B12 1000 mcg    Diagnosis: VITAMIN B12 DEFICIENCY (ICD-266.2)    Route: IM    Site: L deltoid    Exp Date: 01/11/2011    Lot #: [id number]    Mfr: American Regent    Patient tolerated injection without complications    Given by: Lucious Groves CMA (June 02, 2009 10:50 AM) Administered by Sydell Axon  Orders Added: 1)  Vit B12 1000 mcg [J3420] 2)  Admin of Therapeutic Inj  intramuscular or subcutaneous [96372]   Medication Administration  Injection # 1:    Medication: Vit B12 1000 mcg    Diagnosis: VITAMIN B12 DEFICIENCY (ICD-266.2)    Route: IM    Site: L deltoid    Exp Date: 01/11/2011    Lot #: [id number]    Mfr: American Regent    Patient tolerated injection without complications    Given by: Lucious Groves CMA (June 02, 2009 10:50 AM) Administered by Sydell Axon  Orders Added: 1)  Vit B12 1000 mcg [J3420] 2)  Admin of Therapeutic Inj  intramuscular or subcutaneous [29528]

## 2010-04-11 NOTE — Letter (Signed)
Summary: Generic Letter  [facility]  [address] [place], [place] [address]   Phone: [phone]  Fax: [phone]    09/01/2009  NANCYANN COTTERMAN [address] [place], [place]  [address]  Dear Ms. SACHSE,   Tis is to certify that your current house layout cannot accomodate your needs based on your health problems:  diabetes, neuropathy, poor circulation, generalized osteoarthritis, carpal tunnel syndrome, and left leg amputation.    Sincerely,   [name] MD

## 2010-04-11 NOTE — Assessment & Plan Note (Signed)
Summary: FU Angelica Lawrence  #   Vital Signs:  Patient profile:   77 year old female Height:      67 inches (170.18 cm) Weight:      192 pounds (87.27 kg) BMI:     30.18 O2 Sat:      99 % on Room air Temp:     97.3 degrees F (36.28 degrees C) oral Pulse rate:   66 / minute BP sitting:   124 / 72  (left arm) Cuff size:   large  Vitals Entered By: Brenton Grills MA (October 21, 2009 1:08 PM)  O2 Flow:  Room air CC: F/U appt/aj Is Patient Diabetic? Yes   CC:  F/U appt/aj.  History of Present Illness: the status of at least 3 ongoing medical problems is addressed today: dm:  no cbg record, but states cbg's are frequently low, especially in the afternoon.  she gets tremor then. anemia:  pt says she has slight easy bruising.   insomnia: persists.  Current Medications (verified): 1)  Atenolol 25 Mg  Tabs (Atenolol) .... Take 1 By Mouth Qd 2)  Humalog 100 Unit/ml Soln (Insulin Lispro (Human)) .... 40 Units Three Times A Day (Qac) 3)  Lasix 40 Mg Tabs (Furosemide) .Marland Kitchen.. 1 Tablet By Mouth Once A Day 4)  Omeprazole 20 Mg Cpdr (Omeprazole) .Marland Kitchen.. 1 Capsule By Mouth Every Morning 5)  Simvastatin 80 Mg  Tabs (Simvastatin) .... Takem 1 By Mouth Qd 6)  Cholestyramine   Powd (Cholestyramine) .... Use 1 Scoop Once Daily With 2 Ounce Water Pls Keep 02/27/07 Appt For Addtional Refills 7)  Isosorbide Dinitrate 10 Mg  Tabs (Isosorbide Dinitrate) .... Take 1 By Mouth Qd 8)  B12 .Marland Kitchen.. 1000 Micrograms Q Month 9)  Losartan Potassium-Hctz 100-25 Mg Tabs (Losartan Potassium-Hctz) .Marland Kitchen.. 1 Qd 10)  Aspirin 81 Mg Tbec (Aspirin) .Marland Kitchen.. 1 Tablet Once Daily  Allergies (verified): No Known Drug Allergies  Past History:  Past Medical History: Last updated: 12/30/2006 Allergic rhinitis Anemia-NOS Colonic polyps, hx of Coronary artery disease Diabetes mellitus, type I Diverticulosis, colon Hyperlipidemia Hypertension Peripheral neuropathy Peripheral vascular disease  Review of Systems  The patient denies  hematochezia and hematuria.    Physical Exam  General:  elderly, frail, no distress.  in wheelchair. Extremities:  1+ right pedal edema.     Impression & Recommendations:  Problem # 1:  DIABETES MELLITUS, TYPE I (ICD-250.01) overcontrolled  HgbA1C: 5.8 (09/01/2009)  Problem # 2:  INSOMNIA (ICD-780.52) needs increased rx  Problem # 3:  HYPERLIPIDEMIA (ICD-272.4) well-controlled  CHOL: 100 (09/01/2009)   LDL: 46 (09/01/2009)   HDL: 35.00 (09/01/2009)   TG: 94.0 (09/01/2009)  Problem # 4:  ANEMIA, IRON DEFICIENCY (ICD-280.9) needs increased rx uncertain etiology  Medications Added to Medication List This Visit: 1)  Humalog 100 Unit/ml Soln (Insulin lispro (human)) .... 30 units three times a day (qac) 2)  Aspirin 81 Mg Tbec (Aspirin) .Marland Kitchen.. 1 tablet once daily 3)  Cvs Iron 325 (65 Fe) Mg Tabs (Ferrous sulfate) .Marland Kitchen.. 1 tab once daily 4)  Simvastatin 40 Mg Tabs (Simvastatin) .Marland Kitchen.. 1 tab at bedtime 5)  Trazodone Hcl 50 Mg Tabs (Trazodone hcl) .Marland Kitchen.. 1 tab at bedtime  Other Orders: Est. Patient Level IV (16109)  Patient Instructions: 1)  here are some tests for blood in the bowels, with instructions to return. 2)  reduce humalog to 30 units three times a day (just before each meal). 3)  take iron pills, 1 per day (non-prescription). 4)  reduce simvastatin  to 40 mg at bedtime. 5)  trazodone 50 mg at bedtime. 6)  Please schedule a physical appointment in 1 month. Prescriptions: TRAZODONE HCL 50 MG TABS (TRAZODONE HCL) 1 tab at bedtime  #30 x 11   Entered and Authorized by:   Minus Breeding MD   Signed by:   Minus Breeding MD on 10/21/2009   Method used:   [facility] to        [facility] [phone]* (retail)       [address] [place]  [address]       Ph: [phone] or [phone]       Fax: [phone]   RxID:   [id number] SIMVASTATIN 40 MG TABS (SIMVASTATIN) 1 tab at bedtime  #30 x 11   Entered and Authorized by:   Minus Breeding MD   Signed by:    Minus Breeding MD on 10/21/2009   Method used:   [facility]ronically to        [facility] [phone]* (retail)       [address]       [place], [place]  [address]       Ph: [phone] or [phone]       Fax: [phone]   RxID:   [id number]

## 2010-04-11 NOTE — Progress Notes (Signed)
Summary: Letter  Phone Note Call from Patient Call back at Home Phone [phone]   Caller: Daughter Angelica Lawrence Summary of Call: Pt's daughter called requesting MD write letter detailing pt's disability. Pt is having work done to her home to have ramps installed and wheelchair access to her bathroom. Initial call taken by: Margaret Pyle, CMA,  August 29, 2009 3:12 PM  Follow-up for Phone Call        ov is due.  let's address then Follow-up by: Minus Breeding MD,  August 29, 2009 3:42 PM  Additional Follow-up for Phone Call Additional follow up Details #1::        pt's daughter Angelica Lawrence informed and will call back to sch appt Additional Follow-up by: Margaret Pyle, CMA,  August 30, 2009 9:03 AM

## 2010-04-11 NOTE — Progress Notes (Signed)
Summary: omeprazole  Phone Note Refill Request Message from:  Fax from Pharmacy on November 01, 2009 4:13 PM  Refills Requested: Medication #1:  OMEPRAZOLE 20 MG CPDR 1 capsule by mouth every morning   Dosage confirmed as above?Dosage Confirmed   Last Refilled: 09/26/2009  Method Requested: Electronic Initial call taken by: Brenton Grills MA,  November 01, 2009 4:13 PM    Prescriptions: OMEPRAZOLE 20 MG CPDR (OMEPRAZOLE) 1 capsule by mouth every morning  #30 Capsule x 2   Entered by:   Brenton Grills MA   Authorized by:   Minus Breeding MD   Signed by:   Brenton Grills MA on 11/01/2009   Method used:   [facility] to        [facility] [phone]* (retail)       [address]       [place], [place]  [address]       Ph: [phone] or [phone]       Fax: [phone]   RxID:   [id number]

## 2010-04-11 NOTE — Assessment & Plan Note (Signed)
Summary: FU Angelica Lawrence   Vital Signs:  Patient profile:   77 year old female Height:      67 inches (170.18 cm) Weight:      236.6 pounds (107.55 kg) O2 Sat:      95 % on Room air Temp:     97.6 degrees F (36.44 degrees C) oral Pulse rate:   56 / minute BP sitting:   126 / 74  (right arm) Cuff size:   large  Vitals Entered By: Orlan Leavens RMA (December 12, 2009 10:32 AM)  O2 Flow:  Room air CC: 2 month follow-up Is Patient Diabetic? Yes Did you bring your meter with you today? No Pain Assessment Patient in pain? no        CC:  2 month follow-up.  History of Present Illness: the status of at least 3 ongoing medical problems is addressed today: dm:  no cbg record, but states cbg's vary from 70-100, with no trend throughout the day. insomnia:  pt says desyrel helps, but relief is incomplete. anemia:  pt says she takes fe as rx'ed, and has no hematuria  Current Medications (verified): 1)  Atenolol 25 Mg  Tabs (Atenolol) .... Take 1 By Mouth Qd 2)  Humalog 100 Unit/ml Soln (Insulin Lispro (Human)) .... 30 Units Three Times A Day (Qac) 3)  Lasix 40 Mg Tabs (Furosemide) .Marland Kitchen.. 1 Tablet By Mouth Once A Day---Please Call For Rov 4)  Omeprazole 20 Mg Cpdr (Omeprazole) .Marland Kitchen.. 1 Capsule By Mouth Every Morning 5)  Cholestyramine   Powd (Cholestyramine) .... Use 1 Scoop Once Daily With 2 Ounce Water Pls Keep 02/27/07 Appt For Addtional Refills 6)  Isosorbide Dinitrate 10 Mg  Tabs (Isosorbide Dinitrate) .... Take 1 By Mouth Qd 7)  B12 .Marland Kitchen.. 1000 Micrograms Q Month 8)  Losartan Potassium-Hctz 100-25 Mg Tabs (Losartan Potassium-Hctz) .Marland Kitchen.. 1 Qd 9)  Aspirin 81 Mg Tbec (Aspirin) .Marland Kitchen.. 1 Tablet Once Daily 10)  Cvs Iron 325 (65 Fe) Mg Tabs (Ferrous Sulfate) .Marland Kitchen.. 1 Tab Once Daily 11)  Simvastatin 40 Mg Tabs (Simvastatin) .Marland Kitchen.. 1 Tab At Bedtime 12)  Trazodone Hcl 50 Mg Tabs (Trazodone Hcl) .Marland Kitchen.. 1 Tab At Bedtime  Allergies (verified): No Known Drug Allergies  Past History:  Past Medical History: Last  updated: 12/30/2006 Allergic rhinitis Anemia-NOS Colonic polyps, hx of Coronary artery disease Diabetes mellitus, type I Diverticulosis, colon Hyperlipidemia Hypertension Peripheral neuropathy Peripheral vascular disease  Review of Systems  The patient denies syncope and hematochezia.    Physical Exam  General:  elderly, frail, no distress.  in wheelchair Psych:  Alert and cooperative; normal mood and affect; normal attention span and concentration.     Impression & Recommendations:  Problem # 1:  DIABETES MELLITUS, TYPE I (ICD-250.01) apparently stilll overcontrolled  Problem # 2:  INSOMNIA (ICD-780.52) needs increased rx  Problem # 3:  ANEMIA, IRON DEFICIENCY (ICD-280.9) uncertain etiology  Medications Added to Medication List This Visit: 1)  Humalog 100 Unit/ml Soln (Insulin lispro (human)) .... 25 units three times a day (just before each meal) 2)  Trazodone Hcl 100 Mg Tabs (Trazodone hcl) .Marland Kitchen.. 1 tab at bedtime  Other Orders: Flu Vaccine 39yrs + MEDICARE PATIENTS (Z6109) Administration Flu vaccine - MCR (G0008) TLB-Lipid Panel (80061-LIPID) TLB-BMP (Basic Metabolic Panel-BMET) (80048-METABOL) TLB-CBC Platelet - w/Differential (85025-CBCD) TLB-Hepatic/Liver Function Pnl (80076-HEPATIC) TLB-TSH (Thyroid Stimulating Hormone) (84443-TSH) TLB-IBC Pnl (Iron/FE;Transferrin) (83550-IBC) TLB-A1C / Hgb A1C (Glycohemoglobin) (83036-A1C) TLB-Udip w/ Micro (81001-URINE) Admin of Therapeutic Inj  intramuscular or subcutaneous (60454) Vit  B12 1000 mcg (J3420) Est. Patient Level IV (16109)  Patient Instructions: 1)  reduce humalog to 25 units three times a day (just before each meal). 2)  increase trazodone to 100 mg at bedtime. 3)  Please schedule a physical appointment in 1 month. 4)  blood tests are being ordered for you today.  please call [phone] to hear your test results. Flu Vaccine Consent Questions     Do you have a history of severe allergic reactions to this  vaccine? no    Any prior history of allergic reactions to egg and/or gelatin? no    Do you have a sensitivity to the preservative Thimersol? no    Do you have a past history of Guillan-Barre Syndrome? no    Do you currently have an acute febrile illness? no    Have you ever had a severe reaction to latex? no    Vaccine information given and explained to patient? yes    Are you currently pregnant? no    Lot Number:[id number]   Exp Date:09/09/2010   Site Given  Right Deltoid IMPrescriptions: TRAZODONE HCL 100 MG TABS (TRAZODONE HCL) 1 tab at bedtime  #30 x 11   Entered and Authorized by:   Minus Breeding MD   Signed by:   Minus Breeding MD on 12/12/2009   Method used:   [facility] to        [facility] [phone]* (retail)       [address] [place]  [address]       Ph: [phone] or [phone]       Fax: [phone]   RxID:   [id number]    .lbmedflu   Medication Administration  Injection # 1:    Medication: Vit B12 1000 mcg    Diagnosis: VITAMIN B12 DEFICIENCY (ICD-266.2)    Route: IM    Site: L deltoid    Exp Date: 09/10/2011    Lot #: [id number]    Mfr: American Regent    Patient tolerated injection without complications    Given by: Margaret Pyle, CMA (December 12, 2009 11:13 AM)  Orders Added: 1)  Flu Vaccine 10yrs + MEDICARE PATIENTS [Q2039] 2)  Administration Flu vaccine - MCR [G0008] 3)  TLB-Lipid Panel [80061-LIPID] 4)  TLB-BMP (Basic Metabolic Panel-BMET) [80048-METABOL] 5)  TLB-CBC Platelet - w/Differential [85025-CBCD] 6)  TLB-Hepatic/Liver Function Pnl [80076-HEPATIC] 7)  TLB-TSH (Thyroid Stimulating Hormone) [84443-TSH] 8)  TLB-IBC Pnl (Iron/FE;Transferrin) [83550-IBC] 9)  TLB-A1C / Hgb A1C (Glycohemoglobin) [83036-A1C] 10)  TLB-Udip w/ Micro [81001-URINE] 11)  Admin of Therapeutic Inj  intramuscular or subcutaneous [96372] 12)  Vit B12 1000 mcg [J3420] 13)  Est. Patient Level IV [95638]

## 2010-04-11 NOTE — Assessment & Plan Note (Signed)
Summary: [name]   Nurse Visit   Allergies: No Known Drug Allergies  Medication Administration  Injection # 1:    Medication: Vit B12 1000 mcg    Diagnosis: VITAMIN B12 DEFICIENCY (ICD-266.2)    Route: IM    Site: R deltoid    Exp Date: 02/10/2011    Lot #: [id number]    Mfr: American Regent    Patient tolerated injection without complications    Given by: Margaret Pyle, CMA (July 08, 2009 10:16 AM)  Orders Added: 1)  Vit B12 1000 mcg [J3420] 2)  Admin of Therapeutic Inj  intramuscular or subcutaneous [40347]

## 2010-04-11 NOTE — Assessment & Plan Note (Signed)
Summary: f/u appt per pt/cd   Vital Signs:  Patient profile:   77 year old female Height:      67 inches (170.18 cm) O2 Sat:      98 % on Room air Temp:     98.1 degrees F (36.72 degrees C) oral Pulse rate:   74 / minute BP sitting:   112 / 60  (left arm) Cuff size:   large  Vitals Entered By: Orlan Leavens (September 01, 2009 1:09 PM)  O2 Flow:  Room air CC: follow-up visit Is Patient Diabetic? Yes Did you bring your meter with you today? No Pain Assessment Patient in pain? no        CC:  follow-up visit.  History of Present Illness: pt fell 2 weeks ago, spraining left shoulder.  since then, she has 2 weeks of pain. it has improved from moderate to mild since then.  no associated numbness. dm:  no cbg record, but states cbg's are 98-170.  she says there is no trend theroughout the day.   htn:  pt says she takes and tolerates the hyzaar.  he says it causes diuresis.    Current Medications (verified): 1)  Atenolol 25 Mg  Tabs (Atenolol) .... Take 1 By Mouth Qd 2)  Humalog 100 Unit/ml Soln (Insulin Lispro (Human)) .... 40 Units Three Times A Day (Qac) 3)  Lasix 40 Mg Tabs (Furosemide) .Marland Kitchen.. 1 Tablet By Mouth Once A Day 4)  Omeprazole 20 Mg Cpdr (Omeprazole) .Marland Kitchen.. 1 Capsule By Mouth Every Morning 5)  Simvastatin 80 Mg  Tabs (Simvastatin) .... Takem 1 By Mouth Qd 6)  Cholestyramine   Powd (Cholestyramine) .... Use 1 Scoop Once Daily With 2 Ounce Water Pls Keep 02/27/07 Appt For Addtional Refills 7)  Isosorbide Dinitrate 10 Mg  Tabs (Isosorbide Dinitrate) .... Take 1 By Mouth Qd 8)  B12 .Marland Kitchen.. 1000 Micrograms Q Month 9)  Losartan Potassium-Hctz 100-25 Mg Tabs (Losartan Potassium-Hctz) .Marland Kitchen.. 1 Qd  Allergies (verified): No Known Drug Allergies  Past History:  Past Medical History: Last updated: 12/30/2006 Allergic rhinitis Anemia-NOS Colonic polyps, hx of Coronary artery disease Diabetes mellitus, type I Diverticulosis, colon Hyperlipidemia Hypertension Peripheral  neuropathy Peripheral vascular disease  Review of Systems  The patient denies hypoglycemia.         she has lost a few lbs, due to her efforts  Physical Exam  General:  obese.  in wheelchair.  no distress Msk:  full rom of left shoulder, with slight pain Extremities:  2+ right pedal edema.   Additional Exam:  Hemoglobin A1C            5.8 %    Impression & Recommendations:  Problem # 1:  shoulder pain due to fall.  improved  Problem # 2:  DIABETES MELLITUS, TYPE I (ICD-250.01) well-controlled  Problem # 3:  HYPERTENSION (ICD-401.9) well-controlled  Other Orders: TLB-Microalbumin/Creat Ratio, Urine (82043-MALB) TLB-Udip w/ Micro (81001-URINE) Vit B12 1000 mcg (J3420) Admin of Therapeutic Inj  intramuscular or subcutaneous (04540) TLB-Lipid Panel (80061-LIPID) TLB-BMP (Basic Metabolic Panel-BMET) (80048-METABOL) TLB-CBC Platelet - w/Differential (85025-CBCD) TLB-Hepatic/Liver Function Pnl (80076-HEPATIC) TLB-TSH (Thyroid Stimulating Hormone) (84443-TSH) TLB-A1C / Hgb A1C (Glycohemoglobin) (83036-A1C) TLB-IBC Pnl (Iron/FE;Transferrin) (83550-IBC) TLB-B12 + Folate Pnl (98119_14782-N56/OZH) TLB-BNP (B-Natriuretic Peptide) (83880-BNPR) Est. Patient Level IV (08657)  Patient Instructions: 1)  please keep appointment with orthopedic dr.   2)  blood tests are being ordered for you today.  please call [phone] to hear your test results. 3)  Please schedule a physical  appointment in 1 month. 4)  (update: i left message on phone-tree:  rx as we discussed).   Medication Administration  Injection # 1:    Medication: Vit B12 1000 mcg    Diagnosis: VITAMIN B12 DEFICIENCY (ICD-266.2)    Route: IM    Site: R deltoid    Exp Date: 04/2011    Lot #: [id number]    Mfr: American Regent    Patient tolerated injection without complications    Given by: Orlan Leavens (September 01, 2009 1:46 PM)  Orders Added: 1)  TLB-Microalbumin/Creat Ratio, Urine [82043-MALB] 2)  TLB-Udip w/ Micro  [81001-URINE] 3)  Vit B12 1000 mcg [J3420] 4)  Admin of Therapeutic Inj  intramuscular or subcutaneous [96372] 5)  TLB-Lipid Panel [80061-LIPID] 6)  TLB-BMP (Basic Metabolic Panel-BMET) [80048-METABOL] 7)  TLB-CBC Platelet - w/Differential [85025-CBCD] 8)  TLB-Hepatic/Liver Function Pnl [80076-HEPATIC] 9)  TLB-TSH (Thyroid Stimulating Hormone) [84443-TSH] 10)  TLB-A1C / Hgb A1C (Glycohemoglobin) [83036-A1C] 11)  TLB-IBC Pnl (Iron/FE;Transferrin) [83550-IBC] 12)  TLB-B12 + Folate Pnl [82746_82607-B12/FOL] 13)  TLB-BNP (B-Natriuretic Peptide) [83880-BNPR] 14)  Est. Patient Level IV [16109]

## 2010-04-11 NOTE — Progress Notes (Signed)
Summary: Humalog PA  Phone Note From Pharmacy   Caller: [phone] Call For: ID:  [id number]    Summary of Call: PA request--Humalog. Patient brought in paperwork regarding a temporary supply of Humalog/PA needed. Per MD called for PA. They will fax forms. Initial call taken by: Lucious Groves,  April 12, 2009 11:36 AM  Follow-up for Phone Call        awaiting MD completion. Follow-up by: Lucious Groves,  April 12, 2009 1:22 PM  Additional Follow-up for Phone Call Additional follow up Details #1::        faxed and will wait for reply. Additional Follow-up by: Lucious Groves,  April 12, 2009 5:04 PM    Additional Follow-up for Phone Call Additional follow up Details #2::    Patient insurance company called and they need to know does the patient self inject via needle or does patient have a pump. PA will not be processed until I can provide this info.Lucious Groves,  April 13, 2009 3:01 PM  Additional Follow-up for Phone Call Additional follow up Details #3:: Details for Additional Follow-up Action Taken: no pump.  pt injects humalog three times a day.  Rec'd call and is approved x1 yr.   Additional Follow-up by: Minus Breeding MD,  April 13, 2009 6:31 PM

## 2010-04-11 NOTE — Assessment & Plan Note (Signed)
Summary: PER CHRISTY --STC   Vital Signs:  Patient profile:   77 year old female Height:      67 inches (170.18 cm) Weight:      206.38 pounds (93.81 kg) O2 Sat:      97 % on Room air Temp:     97.7 degrees F (36.50 degrees C) oral Pulse rate:   73 / minute BP sitting:   128 / 54  (left arm) Cuff size:   large  Vitals Entered By: Josph Macho CMA (April 12, 2009 10:32 AM)  O2 Flow:  Room air CC: Follow-up visit/ CF Is Patient Diabetic? Yes   CC:  Follow-up visit/ CF.  History of Present Illness: no cbg record, but states cbg's are well-controlled, except high-100's at hs.  i checked with [employer]--all fast-acting analogs are prior auth. pt takes benicar-hct as rx'ed, but wants a cheaper alternative. elevated lft were noted at a previous ov    Current Medications (verified): 1)  Atenolol 25 Mg  Tabs (Atenolol) .... Take 1 By Mouth Qd 2)  Benicar Hct 40-25 Mg Tabs (Olmesartan Medoxomil-Hctz) .... Take 1 By Mouth Once Daily 3)  Humalog 100 Unit/ml Soln (Insulin Lispro (Human)) .... 40 Units Three Times A Day (Qac) 4)  Lasix 40 Mg Tabs (Furosemide) .Marland Kitchen.. 1 Tablet By Mouth Once A Day 5)  Omeprazole 20 Mg Cpdr (Omeprazole) .Marland Kitchen.. 1 Capsule By Mouth Every Morning 6)  Simvastatin 80 Mg  Tabs (Simvastatin) .... Takem 1 By Mouth Qd 7)  Cholestyramine   Powd (Cholestyramine) .... Use 1 Scoop Once Daily With 2 Ounce Water Pls Keep 02/27/07 Appt For Addtional Refills 8)  Isosorbide Dinitrate 10 Mg  Tabs (Isosorbide Dinitrate) .... Take 1 By Mouth Qd 9)  B12 .Marland Kitchen.. 1000 Micrograms Q Month  Allergies (verified): No Known Drug Allergies  Past History:  Past Medical History: Last updated: 12/30/2006 Allergic rhinitis Anemia-NOS Colonic polyps, hx of Coronary artery disease Diabetes mellitus, type I Diverticulosis, colon Hyperlipidemia Hypertension Peripheral neuropathy Peripheral vascular disease  Review of Systems  The patient denies hypoglycemia and abdominal pain.      Physical Exam  General:  elderly, frail, no distress.  in wheelchair  Pulses:  dorsalis pedis intact on right.  no carotid bruit  Extremities:  has left bka right leg has no deformity.  no ulcer Neurologic:  sensation is intact to touch on the right foot. Additional Exam:  Hemoglobin A1C       [H]  6.8 %   Total Bilirubin           0.6 mg/dL                   1.6-1.0   Direct Bilirubin          0.3 mg/dL                   9.6-0.4   Alkaline Phosphatase      57 U/L                      39-117   AST                  [H]  43 U/L                      0-37   ALT  23 U/L                      0-35   Total Protein             6.6 g/dL                    1.6-1.0   Albumin              [L]  3.0 g/dL   Impression & Recommendations:  Problem # 1:  DIABETES MELLITUS, TYPE I (ICD-250.01) well-controlled  Problem # 2:  HYPERTENSION (ICD-401.9) well-controlled, but wants a cheaper med.  Problem # 3:  UNSPECIFIED DISORDER OF LIVER (ICD-573.9) most likely is nash  Medications Added to Medication List This Visit: 1)  Losartan Potassium-hctz 100-25 Mg Tabs (Losartan potassium-hctz) .Marland Kitchen.. 1 qd  Other Orders: T-Hepatitis B Surface Antigen (96045-40981) T-Hepatitis C Antibody (19147-82956) Vit B12 1000 mcg (J3420) Admin of Therapeutic Inj  intramuscular or subcutaneous (21308) TLB-Hepatic/Liver Function Pnl (80076-HEPATIC) TLB-A1C / Hgb A1C (Glycohemoglobin) (83036-A1C) Est. Patient Level IV (65784)  Patient Instructions: 1)  pending the test results, i've requested prior auth for humalog. 2)  tests are being ordered for you today.  a few days after the test(s), please call [phone] to hear your test results. 3)  please schedule a physical in 6 months. 4)  change benicar-hct to losartan-hctz 100/25, 1 per day. 5)  we'll follow the lft Prescriptions: LOSARTAN POTASSIUM-HCTZ 100-25 MG TABS (LOSARTAN POTASSIUM-HCTZ) 1 qd  #30 x 11   Entered and Authorized by:   Minus Breeding MD   Signed by:   Minus Breeding MD on 04/12/2009   Method used:   Electronically to        [facility] [phone]* (retail)       [address], [place]  [address]       Ph: [phone] or [phone]       Fax: [phone]   RxID:   [id number]    Medication Administration  Injection # 1:    Medication: Vit B12 1000 mcg    Diagnosis: VITAMIN B12 DEFICIENCY (ICD-266.2)    Route: IM    Site: R deltoid    Exp Date: 9/12    Lot #: [id number]    Mfr: American Regent    Patient tolerated injection without complications    Given by: Josph Macho CMA (April 12, 2009 11:29 AM)  Orders Added: 1)  T-Hepatitis B Surface Antigen [09323-55732] 2)  T-Hepatitis C Antibody [20254-27062] 3)  Vit B12 1000 mcg [J3420] 4)  Admin of Therapeutic Inj  intramuscular or subcutaneous [96372] 5)  TLB-Hepatic/Liver Function Pnl [80076-HEPATIC] 6)  TLB-A1C / Hgb A1C (Glycohemoglobin) [83036-A1C] 7)  Est. Patient Level IV [37628]

## 2010-04-11 NOTE — Letter (Signed)
Summary: [facility]   Imported By: Sherian Rein 07/20/2009 08:18:15  _____________________________________________________________________  External Attachment:    Type:   Image     Comment:   External Document

## 2010-05-10 ENCOUNTER — Telehealth: Payer: Self-pay | Admitting: Endocrinology

## 2010-05-18 NOTE — Progress Notes (Signed)
Summary: HUMALOG REFILL  Phone Note Call from Patient Call back at [facility] Phone [phone]   Caller: Patient Summary of Call: PATIENTS NEEDS HUMALOG [facility] [address] [address] Initial call taken by: Hilarie Fredrickson,  May 10, 2010 10:40 AM    Prescriptions: HUMALOG 100 UNIT/ML SOLN (INSULIN LISPRO (HUMAN)) 25 units three times a day (just before each meal)  #60mth x 5   Entered by:   Margaret Pyle, CMA   Authorized by:   Minus Breeding MD   Signed by:   Margaret Pyle, CMA on 05/10/2010   Method used:   [facility]ronically to        [facility] [phone]* (retail)       [address]       [place], [place]  [address]       Ph: [phone] or [phone]       Fax: [phone]   RxID:   [id number]

## 2010-06-04 ENCOUNTER — Other Ambulatory Visit: Payer: Self-pay | Admitting: Endocrinology

## 2010-06-08 ENCOUNTER — Telehealth: Payer: Self-pay | Admitting: Endocrinology

## 2010-06-08 NOTE — Telephone Encounter (Signed)
PA requested for Humalog 100 units/mL Vial Per [facility] [place] @[phone]: No PA needed as this [is Not a Pump] ReFaxed Rx refill request w/ information states above to pharmacy at [phone]

## 2010-06-12 NOTE — Telephone Encounter (Signed)
LMOM to f/u w/patient regarding Rx for Humalog that PA was not required by [facility] per representative via telephone.

## 2010-06-20 ENCOUNTER — Other Ambulatory Visit: Payer: Self-pay

## 2010-06-20 MED ORDER — INSULIN PEN NEEDLE 31G X 8 MM MISC: 1.0000 | Freq: Every day | Status: DC

## 2010-06-20 NOTE — Telephone Encounter (Signed)
Pt called requesting refill of Humalog pen needles.

## 2010-07-07 ENCOUNTER — Other Ambulatory Visit: Payer: Self-pay | Admitting: *Deleted

## 2010-07-07 MED ORDER — FUROSEMIDE 40 MG PO TABS: 40.0000 mg | ORAL_TABLET | Freq: Every day | ORAL | Status: DC

## 2010-07-17 ENCOUNTER — Other Ambulatory Visit: Payer: Self-pay | Admitting: Endocrinology

## 2010-07-25 NOTE — Assessment & Plan Note (Signed)
[facility]                            CARDIOLOGY OFFICE NOTE   DAHLILA, PFAHLER                         MRN:          [id number]  DATE:07/10/2007                            DOB:          May 27, 1926    IDENTIFICATION:  Ms. Tullo is a 77 year old woman with a history of CAD  (CABG in April 2003).  I last saw her in March of 2008.  She also has a  history of dyslipidemia, lower extremity edema (felt secondary to venous  stasis), normal LV function and hypertension.  The patient is also a  diabetic.   Since seen she seems to be doing pretty well.  Her breathing has been  good.  She denies chest pain.  Still has some lower extremity edema.  She is using a wheelchair now because of her legs.   CURRENT MEDICATIONS:  Aspirin 325, omeprazole 20, Lasix 40, Benicar HCTZ  40/25 daily, cholestyramine powder, Humalog, atenolol 25, trazodone,  simvastatin 80 and Isorbid 10.   PHYSICAL EXAM:  The patient is in no distress.  Blood pressure is 132/67, pulse is 74.  Weight is 222, stable.  Lungs are clear.  CARDIAC EXAM:  Regular rate and rhythm, S1 and S2, no murmurs.  ABDOMEN:  Benign, obese.  EXTREMITIES:  1+ edema, right lower extremity.  Status post amputation  on the left.   IMPRESSION:  1. Coronary artery disease.  I am not convinced there is any active      ischemia.  I would continue on medical therapy.  2. Dyslipidemia.  Last lipid panel done back in 2008 was good.  Will      need to be rechecked.  3. Diabetes, followed by primary care.   FOLLOWUP:  I would like see the patient back in the winter, sooner if  problems develop.   ADDENDUM:  Will need to check.  The patient should have a follow-up  carotid done.     Pricilla Riffle, MD, [facility]  Electronically Signed    PVR/MedQ  DD: 07/10/2007  DT: 07/10/2007  Job #: [id number]

## 2010-07-25 NOTE — Assessment & Plan Note (Signed)
[facility]                         GASTROENTEROLOGY OFFICE NOTE   TRANG, BOUSE                         MRN:          [id number]  DATE:09/30/2006                            DOB:          1927-03-04    REASON FOR REFERRAL:  Strong family history of colon cancer, gas,  bloating, and diarrhea.   HISTORY OF PRESENT ILLNESS:  This is a 77 year old female that I  previously evaluated in August 2002. She had a colonoscopy at that time  that showed diverticulosis and a polypoid area of mucosa was noted and  biopsied with granulation tissue and a small ulceration. No adenomatous  polyps were noted. Her son developed colon cancer at age 64 and her  daughter developed colon cancer at age 4. The patient notes  intermittent loose bowel movements with associated gas and bloating. She  notes no weight loss, abdominal pain, melena, hematochezia, or change in  stool caliber.   PAST MEDICAL HISTORY:  Hypertension; diabetes mellitus; hyperlipidemia;  status post CVA; status post CABG; diverticulosis.   CURRENT MEDICATIONS:  Listed on the chart-updated and reviewed.   MEDICATION ALLERGIES:  Listed on the chart.   SOCIAL HISTORY:  As per the handwritten form.   REVIEW OF SYSTEMS:  As per the handwritten form.   PHYSICAL EXAMINATION:  GENERAL:  Well developed, overweight African-  American female in no acute distress.  VITAL SIGNS:  Height 5 feet 7 inches, weight 204 pounds, blood pressure  158/60, pulse 84 and regular.  HEENT:  Anicteric sclerae, oropharynx clear.  CHEST:  Clear to auscultation bilaterally.  CARDIAC:  Regular rate and rhythm without murmurs appreciated.  ABDOMEN:  Soft and nontender, nondistended, normoactive bowel sounds, no  palpable organomegaly, masses, or hernias.  RECTAL:  Deferred to the time of colonoscopy.  EXTREMITIES:  Without cyanosis, clubbing, or edema.  NEUROLOGIC:  Awake, alert, and oriented x3. Grossly non-focal.   ASSESSMENT AND PLAN:  Strong family history of colon cancer in her son  at age 50 and her daughter at age 54. She has had some mild intermittent  diarrhea associated with gas and bloating. She has known diverticulosis.  She is to maintain a long term high fiber diet with adequate fluid  intake. Risk, benefits, and alternatives to colonoscopy, possible  biopsy, and  possibly polypectomy were discussed with the patient and she consents to  proceed. This will be scheduled electively.     Venita Lick. Russella Dar, MD, [facility]  Electronically Signed    MTS/MedQ  DD: 10/06/2006  DT: 10/07/2006  Job #: [id number]   cc:   Gregary Signs A. Everardo All, MD

## 2010-07-28 NOTE — Consult Note (Signed)
. [facility]  Patient:    Angelica Lawrence, Angelica Lawrence                         MRN: [id number] Proc. Date: 07/12/99 Adm. Date:  [id number] Disc. Date: [id number] Attending:  [name] CC:         [name], M.D. [facility]                          Consultation Report  HISTORY OF PRESENT ILLNESS:  This 77 year old black female is referred for evaluation and management of deep sunburn lesions in the right lower extremity. The patient has had longstanding type 2 diabetes; and although she apparently has had a left lower extremity BK amputation, this was related to some loss of sensation sustained in a stroke and not to diabetic problems. Nonetheless, she does clearly now have diabetic neuropathy in her remaining right lower extremity with limited feeling there.  With that background history, the patient was sitting on her front porch several weeks ago with her feet extended into the sunshine. She noticed shortly thereafter the development of a large blister on the dorsum of the right hallux and also two blistered areas on the distal anterior right lower leg almost at the ankle crease. These went on to rupture and in the case of the one on the hallux and with some associated surrounding grayish discoloration. In addition, heavy eschars formed on the other two low pretibial lesions. She has treated these by wearing a special shoe that avoided any pressure on her toe and by leaving the wounds themselves open to the air. She has not used any topical or systemic treatments.  She presents now for our evaluation and advice concerning these lesions having been referred today by [name], M.D.  PHYSICAL EXAMINATION:  Examination today is limited to the distal lower extremities.  EXTREMITIES:  The left lower extremity is characterized by a below-the-knee amputation with a full prosthesis in place. The stump there is not evaluated at this time. The  right lower extremity is slightly cool with diminished skin temperatures and no apparent erythema. There is a surgical absence of the fifth toe of that foot. There is hammer toe deformity of the three remaining lesser toes with a callus on the tip of the third toe on the right. The entire dorsal surface of the right hallux is involved with a discolored area which would appear to represent marginally necrotic tissue measuring some 33 x 24 cm. In the center of this is an ulcerated opening with a nonviable base measuring 13 x 16 mm. There is no evidence of drainage or of inflammation or ascending infection. On the low anterior pretibial area near the ankle crease are two punched out areas covered with heavy eschar. Measurements on these were taken only after they were debrided and be reported later. It was noted that the pedal pulses were very faint in that extremity and that there was an absence of protective sensation throughout the extremity.  DISPOSITION: 1. It was felt that the lesion of the toe was clean and although the exact    extent of the injury is not yet absolutely apparent, that there would be no    advantage in addressing in any aggressive fashion at this time but let it    continue uninfected to declare itself more clearly. 2. The two lesions in the lower pretibial  area were sharply debrided and both    were left with a small amount of fibrous eschar still in the base. These    two lesions were to be treated with Accuzyme and an wet-to-dry dressing    once daily. 3. The patient was given general instruction regarding foot care and diabetes    but did not seem terribly interested. This was given initially by video    presentation and was reinforced by both the nurse and physician. 4. The patient appears to have adequate footwear available once these lesions    are healed, and she is advised to bring that with her to her next clinic    visit. Meanwhile, she will wear a healing  sandal which we have today    provided for the right lower extremity. 5. Her follow-up visit to this clinic will be in two weeks. DD:  07/12/99 TD:  07/12/99 Job: [id number]

## 2010-07-28 NOTE — Discharge Summary (Signed)
[place]. [facility]  Patient:    Angelica Lawrence, Angelica Lawrence Visit Number: [id number] MRN: [id number]          Service Type: MED Location: [phone] Attending Physician:  Dolores Patty Dictated by:   Valetta Mole Swords, M.D. [facility] Admit Date:  02/02/2001 Disc. Date: 02/08/01   CC:         [name], M.D. [facility]   Discharge Summary  DISCHARGE DIAGNOSES: 1.  Severe coronary artery disease (medical treatment). 2.  Anemia with hemepositive stools. 3.  Diabetes. 4.  Congestive heart failure on presentation. 5.  Hyperlipidemia. 6.  Peripheral vascular disease. 7.  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 8.  Status post femoral-popliteal bypass (remote). 9.  History of cerebrovascular accident. 10. History of colon polyps. 11. History of gastritis with positive Helicobacter pylori. 12. Status post corneal transplant. 13. Status post cholecystectomy. 14. Status post total abdominal hysterectomy and bilateral     salpingo-oophorectomy. 15. Status post appendectomy. 16. Bilateral cataract surgery. 17. History of carpal tunnel syndrome. 18. History of diverticulosis. 19. Hypertension.  DISCHARGE MEDICATIONS: 1.  Plavix 75 mg p.o. q. day. 2.  ______ 1 tablet daily. 3.  Coated aspirin 81 mg p.o. q. day. 4.  Protonix 40 mg p.o. q. day. 5.  Altace 2.5 mg p.o. b.i.d. 6.  Metoprolol 25 mg p.o. b.i.d. 7.  Questran light 1 packet t.i.d. 8.  Iron sulfate 325 mg p.o. b.i.d. 9.  ______ 15 units subcutaneously at night. 10. Humalog insulin 8 units before breakfast and lunch and 5 units before     dinner (she is to monitor blood sugars before each meal, and if her blood     sugar is above 200, she is to take 6 units of additional Humalog if her     glucose is above 300.  FOLLOWUP PLANS: [name], M.D. [facility], in 2-4 weeks, TA cardiology visit December 13 at 10:15 a.m. and Dietrich Pates, M.D. [facility], on March 18, 2001, at 11:30 a.m.  CONDITION ON  DISCHARGE:  Improved.  Breathing is back to baseline.  She is ambulating well and tolerating a diet.  HOSPITAL PROCEDURES: 1. Upper endoscopy, which was essentially normal. 2. Cardiac catheterization demonstrated a left main with a distal 30%, LAD    of approximately 30% and a distal 30%, left circumflex at approximately    95%, mid 60% and distal 70% with heavy calcification in the DM-3, RCA of    proximal 60%, middiffuse 60-70%, and a distal 20% stenosis, also notable    as a moderate decrease in left ventricular ejection fraction with mild    MR. 3. The patient underwent percutaneous intervention of the left circumflex.    Proximal circumflex went from 95% to 20%. 4. Chest x-ray on February 05, 2001, demonstrated marked improvement with    mild to minimal residual edema and small bilateral pleural effusions. 5. Chest x-ray on February 02, 2001, was consistent with CHF (interstitial    edema). The patient underwent small bowel follow on February 06, 2001,    which was normal. 6. Echocardiogram demonstrated ejection fraction of 40-50% with akinesis of    the periapical wall.  The findings were also suggestive of severe    hypokinesis of the entire posterolateral wall and severe hypokinesis of    the entire posterior wall.  There was also aortic valve calcification    graded as mild, mild mitral annular calcification.  The left atrium was  moderately dilated. 7. EKG on February 02, 2001, demonstrated sinus tachycardia with a left    anterior fascicular block.  HOSPITAL LABORATORIES:  Discharge BMET was normal except for a potassium of 3.3 and a glucose of 125.  CBC at the time of discharge demonstrated a hemoglobin of 11.5, total cholesterol 197, LDL 105, HDL 79, total iron 26, total iron binding capacity 341.  Percent saturation was 8.  TSH was normal at 1.891.  Admission CMET with a hemoglobin of 8.8.  CMET was normal except for a glucose of 156, BUN 26, and an albumin of 3.3.  CK  on admission 179 with an MB of 6.3, relative index of 3.5.  tropinin-i I was 1.01.  On February 03, 2001, CK 129, CK-MB 3.8, relative index 2.9, and troponin I 1.05.  HOSPITAL COURSE: 1. Cardiology.  The patient was admitted to the hospital service with    congestive heart failure, enzymes consistent with a non-Q wave myocardial    infarction.  Echocardiogram and coronary angiogram and intervention    as above. The patient was aggressively treated with diuresis.  Her symptoms    resolved quickly, and she was doing well at the time of discharge.  She    was ambulating without dyspnea.  On exam, her chest was clear to    auscultation, and her jugular venous pressure was normal.  2. Anemia.  The patient was found to be quite anemic in the hospital on    admission.  The patient was transfused two units of packed red blood cells    while hospitalized.  3. GI. The patient was evaluated by gastroenterology with endoscopy and    small bowel follow-through, source of bleeding not identified.  She will    need close followup of her hemoglobin.  It is thought that the use of    Plavix and aspirin benefits outweigh their risks at this particular time.    The patient understands to call for any significant GI bleeding.  4. Fluid/electrolyte nutrition.  Patient with diabetes.  She understands the    importance of controlling her diabetes, and her insulin regimen was changed    in the hospital as listed above.     Dictated by:   Valetta Mole Swords, M.D. [facility] Attending Physician:  Dolores Patty DD:  02/08/01 TD:  02/08/01 Job: [id number]

## 2010-07-28 NOTE — Assessment & Plan Note (Signed)
[facility]                              CARDIOLOGY OFFICE NOTE   Angelica Lawrence, Angelica Lawrence                         MRN:          [id number]  DATE:10/04/2005                            DOB:          January 23, 1927    IDENTIFICATION:  Angelica Lawrence is a 77 year old woman status post CABG in April of  2003.  I last saw her in clinic back in April of this year.   In the interval she is doing okay.  Note unfortunately she had a grandson  who just died recently from cancer.  He was 14.  She has been stressed  because of this.   She was taking Lasix on a fairly regular basis and said her ankle edema  improved.  Now today she has not taken it.   Otherwise breathing is okay.  Denies chest pain.   CURRENT MEDICATIONS:  1.  Atenolol 25 q. day.  2.  Humalog as directed.  3.  Cholestyramine powder q. day.  4.  Benicar/HCTZ 40/25 q. day.  5.  Omeprazole 20 q. day.  6.  Potassium  20 mEq b.i.d.  7.  Zocor 80 q. day.   PHYSICAL EXAMINATION:  GENERAL:  Patient is in no distress.  VITAL SIGNS:  Blood pressure 136/72.  Pulses 76 and regular.  Weight 232.  LUNGS:  Clear.  CARDIAC:  Regular rate and rhythm.  S1, S2. No S3.  ABDOMEN:  Benign.  EXTREMITIES:  1-2+ edema on the right.  Prosthetic valve on the left.   IMPRESSION:  1.  Coronary artery disease.  Seems to be doing okay except for peripheral      edema.  Note a previous echo her EF was read-out as normal with some      diastolic dysfunction.  Would repeat. Would ask her to take her Lasix      every day with some potassium.  We will check some electrolytes.  2.  Dyslipidemia.  We will need to check fasting lipid panel with LFTs and      be in touch with her.  3.  Otherwise I would like to see her back in the Fall,sooner if problems      develop.                               Pricilla Riffle, MD, [facility]    PVR/MedQ  DD:  10/04/2005  DT:  10/04/2005  Job #:  [id number]

## 2010-07-28 NOTE — Consult Note (Signed)
[place]. [facility]  Patient:    Angelica Lawrence, Angelica Lawrence Visit Number: [id number] MRN: [id number]          Service Type: MED Location: [id number] Attending Physician:  Tressie Stalker Dictated by:   Salvatore Decent. Cornelius Moras, M.D. Proc. Date: 06/10/01 Admit Date:  06/01/2001   CC:         Rollene Rotunda, M.D. [facility]  Dietrich Pates, M.D. [facility]  Veneda Melter, M.D. [facility] Darrelyn Hillock, M.D. [facility]  Molly Maduro D. Arlyce Dice, M.D. [facility]   Consultation Report  REQUESTING PHYSICIAN:  Rollene Rotunda, M.D.  PRIMARY CARDIOLOGISTS:  Dietrich Pates, M.D.,  Veneda Melter, M.D.  PRIMARY CARE PHYSICIAN:  [name], M.D.  REASON FOR CONSULTATION:  Severe three-vessel coronary artery disease status post acute non-Q-wave myocardial infarction with moderate left ventricular dysfunction.  HISTORY OF PRESENT ILLNESS:  The patient is a 77 year old African-American female from [place] with known history of coronary artery disease, hypertension, severe peripheral vascular disease, type 2 diabetes mellitus, hyperlipidemia, and cerebrovascular disease. She initially presented with an acute myocardial infarction in September of 2002. She was treated with percutaneous coronary intervention of the left circumflex system at that time. She sustained another myocardial infarction and underwent repeat percutaneous intervention in November of 2002. She initially did reasonably well until over the last few weeks. She states that initially she thought she suffered from an upper respiratory tract infection, and she was seen by her primary care physician and treated for presumed allergies. On the evening of March 22, she developed sudden onset of severe shortness of breath at rest associated with PND and lower extremity edema. She was brought by EMS to the emergency department at [facility] where she was found to be hypoxic and in respiratory distress. She was treated aggressively with diuresis and  admitted to the hospital. She ruled in for an acute non-Q-wave myocardial infarction with peak serum cardiac enzymes including a total CPK of 829 with a CK-MB fraction of 44.4 and a peak troponin-I of 20.97. She was initially treated aggressively with anticoagulation and antiplatelet therapy, but she developed a lower GI bleed with fall of her hemoglobin and hematocrit from 11.5 and 36%, at the time of admission, to 7.8 and 23% the following day. All of the anticoagulation was stopped, and her GI bleeding seemed to stop without intervention. She was seen in consultation at that time by Dr. Melvia Heaps. Eventually, she remained stable and underwent elective cardiac catheterization by Dr. Antoine Poche on March 31. This demonstrates severe three-vessel coronary artery disease with moderate left ventricular dysfunction. Cardiac surgical consultation is requested.  REVIEW OF SYSTEMS:  CARDIAC:  The patient denies ever having any symptoms of chest pain. Her previous myocardial infarctions have all been manifested with symptoms of shortness of breath at rest. She reports that prior to development of severe shortness of breath at rest which prompted hospital admission, she had suffered only from mild dyspnea on exertion. She does have chronic history of two-pillow orthopnea. She denies any syncopal episodes or a history of near syncopal spells. She has not had any problems with palpitations. GENERAL:  The patient otherwise reports feeling relatively well. Her appetite is good and stable. She has not been gaining or losing weight other than the fact that she did gain several pounds over the week prior to admission, which she thinks was all related to fluid retention. RESPIRATORY:  Notable for cough productive of a small amount of whitish sputum at the time of  admission. This has subsided already to some degree. She denies any history of hemoptysis or wheezing. GASTROINTESTINAL:  Notable for gross  heme-positive stool during this hospital admission. The patient has a history of lower GI bleeding in the fall of 2002 and has undergone colonoscopy demonstrating severe diverticulosis as well as some colon polyps. She denies any recent history of problems with constipation or diarrhea. She has not had any problems with hematemesis. NEUROLOGICAL: Notable for decreased sensation in her fingers on both hands and in her right lower leg. She denies any recent symptoms of transient monocular blindness or transient numbness or weakness involving either upper or lower extremity. She states that she did suffer a stroke more than 20 years ago which manifested itself as mild left-sided hemiparesis. She states that her symptoms resolved over a period of several months of rehab. MUSCULOSKELETAL:  Notable for some arthritis in the left knee which has recently started to limit her ability to ambulate with her prosthesis. She uses a walker and cane to ambulate. GENITOURINARY:  Negative. INFECTIOUS:  Negative. ENDOCRINE:  Notable for a longstanding history of type 2 diabetes mellitus. The patient checks her blood sugars once daily at home and administers insulin three times daily. PSYCHIATRIC:  Negative. PERIPHERAL VASCULAR:  Notable for some occasional pains in the right foot which may be related to underlying severe peripheral vascular disease. She denies any symptoms of claudication, although she does not ambulate that much. HEENT:  The patient reports stable eyesight with no recent visual changes. She has only a few teeth left but denies any loose teeth or teeth which are painful at present.  PAST MEDICAL HISTORY:  Notable for coronary artery disease status post myocardial infarction in September of 2002 and again in November of 2002. The  patient has a history of hypertension, type 2 diabetes mellitus with associated diabetic peripheral neuropathy, retinopathy, and nephropathy. The patient has  severe peripheral vascular disease. She sustained a stroke in the early 1980s. She has history of hyperlipidemia. She has history of recurrent lower GI bleeding, although these have usually been associated with anticoagulation related to her coronary artery disease.  PAST SURGICAL HISTORY:  Notable for history of bilateral femoral popliteal bypass more than 20 years ago. The patient ultimately underwent left below-knee amputation after failed femoral popliteal bypass on that side. The patient underwent right fifth toe amputation at the time of her right femoral popliteal bypass. She has undergone an appendectomy, cholecystectomy, and hysterectomy all in the past.  FAMILY HISTORY:  Notable for the absence of early onset coronary artery disease.  SOCIAL HISTORY:  The patient lives with her husband and has a large family which is very supportive. She is still able to ambulate reasonably well with a walker using her left lower extremity prosthesis. 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 She has been retired since then. She is a nonsmoker and denies any history of excessive alcohol consumption.  MEDICATIONS PRIOR TO ADMISSION: 1. Aspirin 81 mg p.o. once daily. 2. Plavix 75 mg p.o. once daily. 3. Lasix 40 mg p.o. once daily. 4. Protonix 40 mg p.o. once daily. 5. Altace 2.5 mg p.o. once daily. 6. Metoprolol 25 mo p.o. twice daily. 7. Simvastatin 20 mg p.o. once daily. 8. Humalog insulin 19 units subcu every morning, 19 units subcu at lunch, and    17 units subcu every evening.  ALLERGIES:  The patient denies any known drug allergies or sensitivities.  PHYSICAL EXAMINATION:  GENERAL:  A pleasant moderately obese African-American female who appears her stated age in no acute distress.  VITAL SIGNS:  She is currently in normal sinus rhythm, normotensive and afebrile.  SKIN:  Clean and dry, although very scaly in her right  lower extremity. There are no open skin ulcerations appreciated.  HEENT:  Notable for only a few remaining teeth, although there is no obvious loose teeth or dental caries.  NECK:  Supple. There is no cervical or supraclavicular lymphadenopathy. No carotid bruits are noted. There is no jugular venous distention.  CHEST:  Auscultation of the chest reveals clear and symmetrical breath sounds bilaterally. There are a few scattered bibasilar crackles. No wheezes or rhonchi are noted.  CARDIOVASCULAR:  Regular rate and rhythm. No murmurs, rubs or gallops are appreciated.  ABDOMEN:  Moderately obese, soft, and nontender. There are no palpable masses. The liver edge could not be palpated.  EXTREMITIES:  The right lower extremity is warm and adequately perfused. Distal pulses are not palpable on the right lower leg at the ankle. The skin is notable for changes of hemosiderosis and dry scaly skin in the right lower leg. There is a well-healed previous surgical scar extending almost the entire length of the right leg from associated saphenous vein procurement. There may be a segment of perhaps 10-15 cm of saphenous vein in the proximal right thigh at most, based upon the external surgical incision. The patient is status post right fifth digit amputation. There are no open ulcerations. The patient is status post left below-knee amputation. The stump appears intact and well healed. There is a previous surgical incision from left saphenous vein procurement which extends approximately half way up the left thigh, again leaving a segment of perhaps 10-15 cm at most of saphenous vein in the proximal left thigh. Examination of both arms is notable for the absence of palpable radial pulses. There is a thready right radial pulse. The left radial pulse is not palpable. Based on physical exam findings, it seems unlikely that radial artery would be appropriate choice for conduit for a surgical  bypass.  NEUROLOGICAL:  Notable for decreased sensation in the right lower extremity below the knee. The neurologic examination is otherwise nonfocal.  RECTAL AND GENITOURINARY:  Both deferred.  IMAGING STUDIES:  Cardiac catheterization performed March 31st is reviewed. This demonstrates severe three-vessel coronary artery disease with very diffuse disease and relatively poor targets for grafting. There is high-grade 90% proximal stenosis of the left anterior descending coronary artery which appears somewhat ulcerated and irregular and may well be the culprit lesion which prompted the patients recent myocardial infarction. The patient has total occlusion of the left circumflex coronary artery with some late collateral filling on left-to-left injection of two diffusely diseased circumflex marginal branches. These may or may not be suitable targets for bypass grafting. The right coronary artery is diffusely diseased with 80-90% proximal stenosis and very diffuse distal right coronary artery disease. There may or may not be a suitable target site for bypass grafting in the distal right circulation. Left ventricular function is moderately reduced with overall ejection fraction estimated 35-40%. There is mild intra-apical hypokinesis.  IMPRESSION:  Severe three-vessel coronary artery disease with moderate left ventricular dysfunction, status post acute non-Q-wave myocardial infarction. The patient had numerous other comorbid conditions as described. Her choices for possible conduit to be utilized for bypass grafting are quite limited. She has sustained recent lower gastrointestinal bleed during this hospitalization in the setting of anticoagulation.  She would be at high risk for peri-operative myocardial infarction due to the very poor target sites for grafting and relative lack of suitable conduit. She will be at high risk for numerous other postoperative complications and even without  complications her  physical rehabilitation postoperatively will be somewhat challenging. Nevertheless, her options with nonsurgical therapy are very limited. It seems unlikely that an attempt at percutaneous coronary intervention of the left anterior coronary artery would provide any reasonable long-term solution to her problems. Long-term medical therapy without any form of revascularization would be fraught with very high risk for a repeat myocardial infarction and/or death in the relatively near future.  PLAN:  I have outlined possible treatment alternatives with the patient and her family. We have discussed the relative indications and potential benefits of coronary artery bypass grafting. We have discussed high risk nature of surgery as well as other issues as previously outlined. All of their questions have been addressed. We tentatively plan to proceed with surgery on Tuesday, April 8th. She will need to remain off of Plavix antiplatelet therapy for at least 5-7 days as per recommendations of the [facility] and AHA guidelines to decrease her likelihood of severe coagulation problems at the time of surgery. Both she and her family understand the possibility that with shortage of appropriate conduit for bypass grafting we may need to utilize cryopreserved human allograft saphenous veins. They understand the associated implications if this is necessary.  cc:  [facility] Office Dictated by:   Salvatore Decent. Cornelius Moras, M.D. Attending Physician:  Tressie Stalker DD:  06/10/01 TD:  06/10/01 Job: [id number]

## 2010-07-28 NOTE — H&P (Signed)
[facility]  Patient:    Angelica Lawrence, Angelica Lawrence Visit Number: [id number] MRN: [id number]          Service Type: MED Location: 1E 0107 01 Attending Physician:  Donnetta Hutching Dictated by:   Mike Gip, P.A.-C. Admit Date:  11/19/2000                           History and Physical  CHIEF COMPLAINT:  Rectal bleeding.  HISTORY:  The patient is a pleasant 77 year old African-American female primary patient of Dr. Everardo All also known to Dr. Russella Dar who has a history of insulin-dependent diabetes mellitus and a prior history of colon polyps in 1996 which were hyperplastic.  She also has a history of hypertension and hyperlipidemia.  She is status post hysterectomy, appendectomy and prior cholecystectomy.  She has a history of peripheral vascular disease and is status post left BKA and has had a prior CVA in 69.  She also has a history of cataracts with cataract extractions and corneal transplant.  The patient had undergone colonoscopy on November 06, 2000 per Dr. Russella Dar for follow up of polyps and also because of a family history of colon cancer in the patients son, which was diagnosed in his 93s.  Colonoscopy revealed severe diverticular disease to the hepatic flexure and inability to traverse beyond the hepatic flexure due to diverticulosis.  There was one polyp in the transverse colon which was approximately 5 mm, sessile and was hot biopsied. The patient had done well since the procedure until earlier this morning.  She says she was awakened from sleep about 4:00 a.m. with some abdominal cramping and urge for a bowel movement.  She got up, went to the bathroom and saw some bright red blood associated with her bowel movement.  She went back to bed and then noticed some blood running down her leg.  Subsequently, she proceeded to have three more episodes of gross rectal bleeding, all with bright red blood.  She had no associated nausea or vomiting, no chest  pain, shortness of breath, diaphoresis or syncope.  She was brought to the emergency room by her family about 6:00 a.m. and was noted to be orthostatic, but not hypotensive, and is admitted with an acute lower GI bleed for stabilization. It is felt she most likely has a postpolypectomy bleed.  However, we cannot rule out diverticular source.  She has not been on any aspirin or NSAID since her colonoscopy.  LABORATORY DATA:  Hemoglobin 8.6, hematocrit 25.4, wbcs 6.4, platelets 235, protime 15.3, INR 1.3.  Sodium 140, potassium 4.6, BUN 31, creatinine 1.2. LFTs within normal limits.  CURRENT MEDICATIONS:  Aspirin, one p.o. q.d., which is on hold.  Atacan 8 mg q.d.  Questran, two scoops q.d.  Actos 30 q.d.  Tiazac 360 q.d.  Lasix 20 q.d. Altace 10 q.d.  Humalog insulin, 20 units q.a.m., 20 units at 3:00 p.m. and 10 units q.h.s.  ALLERGIES:  No known drug allergies.  PAST MEDICAL HISTORY:  As outlined above.  SOCIAL HISTORY:  The patient is married.  She is retired.  No ETOH and no tobacco.  FAMILY HISTORY:  Pertinent for two children with diabetes.  Also, one son in his 77s diagnosed with colon cancer.  REVIEW OF SYSTEMS:  CARDIOVASCULAR: Denies any chest pain or anginal symptoms. PULMONARY: Negative for cough, shortness of breath or sputum production. GENITOURINARY:  Denies any dysuria, urgency or frequency.  EXTREMITIES: She is status post left  BKA.  She says that she hit the end of her stump today trying to get out of bed, and that it is sore.  GASTROINTESTINAL: As outlined above.  PHYSICAL EXAMINATION:  GENERAL:  Well-developed African-American female.  She is alert and oriented in no acute distress.  VITAL SIGNS:  Temperature 97.4, blood pressure 128/68, pulse 105, respirations 20.  HEENT:  Normocephalic and atraumatic.  EOMI.  PERRLA.  Sclerae anicteric. There is no JVD or bruit.  CARDIOVASCULAR:  Regular rate and rhythm with S1 and S2.  PULMONARY:  Clear to  A&P.  ABDOMEN:  Soft and nontender.  There is no mass or hepatosplenomegaly.  Bowel sounds are active.  She does have a cholecystectomy and hysterectomy scar.  RECTAL:  Grossly heme positive per the ER physician and not repeated.  EXTREMITIES:  No clubbing, cyanosis, or edema.  She is status post left BKA. The stump is benign-appearing without obvious ecchymosis or laceration where she bumped it today.  NEUROLOGIC:  Nonfocal.  IMPRESSION: 74. A 77 year old African-American female with acute lower gastrointestinal    bleed, likely postpolypectomy bleed.  Cannot rule out diverticular source. 2. Anemia, normocytic, secondary to the above. 3. Insulin-dependent diabetes mellitus. 4. Hypertension. 5. Hyperlipidemia. 6. Status post appendectomy, cholecystectomy and hysterectomy. 7. History of peripheral vascular disease status post left below-the-knee    amputation. 8. History of cerebrovascular accident in 31.  PLAN:  The patient is admitted to the service of Dr. Melvia Heaps, who is covering the hospital.  She will be hydrated and placed at bowel rest.  Will transfuse her as needed fro hemoglobin less than 9.  If her bleeding persists beyond 24 hours, will consider colonoscopy for therapeutic reasons for cauterization and control of bleeding.  She was scheduled for barium enema next week to clear her right colon, and this will be done prior to discharge. The patient is to be covered with sliding-scale insulin and will hold her antihypertensives today.  For details, please see the orders. Dictated by:   Mike Gip, P.A.-C. Attending Physician:  Donnetta Hutching DD:  11/19/00 TD:  11/19/00 Job: [id number]

## 2010-07-28 NOTE — H&P (Signed)
NAMEHAYDIN, Angelica Lawrence                  ACCOUNT NO.:  [id number]   MEDICAL RECORD NO.:  [id number]          PATIENT TYPE:  OBV   LOCATION:  0101                         FACILITY:  [facility]   PHYSICIAN:  Rosalyn Gess. Norins, M.D. LHCDATE OF BIRTH:  1926/04/20   DATE OF ADMISSION:  08/21/2004  DATE OF DISCHARGE:                                HISTORY & PHYSICAL   PRIMARY CARE PHYSICIAN:  Sean A. Everardo All, M.D.   PRIMARY CARDIOLOGIST:  Pricilla Riffle, M.D.   CHIEF COMPLAINT:  Nausea, vomiting, and diarrhea.   HISTORY OF PRESENT ILLNESS:  Angelica Lawrence is a 77 year old married black female  with a history of CAD/CABG, status post amputation of the left leg and  history of diabetes, who reports the onset on August 20, 2004 in the p.m. with  nausea, vomiting, and diarrhea.  This kept her up through the night.  The  emesis contained dark-black, coffee-ground type of particles.  She reports  her diarrhea contained black, coffee-ground material.  By the end of the  night, her diarrhea was clear.  The patient has been having epigastric pain  and lower abdominal pain for several days.  She was feeling very lightheaded  and weak and required ambulance transport to the hospital.  Initial ER  evaluation revealed normal CBC.  Chemistry panel was significant for mild  increase in her LFTs and elevated serum glucose.  UA was negative.  Patient  is now admitted with abdominal pain to rule out GI bleed for management of  dehydration secondary to gastroenteritis.   PAST SURGICAL HISTORY:  1.  Patient is status post bypass surgery in 2003.  2.  Status post left BKA 20 years ago.  3.  Status post right femoral popliteal bypass grafting in the remote past.  4.  Hysterectomy.  5.  Cholecystectomy.  6.  Patient reports that she has had an incidental appendectomy.   PAST MEDICAL HISTORY:  1.  Insulin-dependent diabetes.  2.  Hypertension.  3.  Known coronary artery disease.  4.  Hyperlipidemia.  5.  History of lower  GI bleed in the past.  6.  History of CVA 20 years ago with a mild left upper arm sequelae.   CURRENT MEDICATIONS:  1.  Humalog 35 units in the a.m., 40 units at lunch, 40 units at supper.  2.  Atenolol 25 mg daily.  3.  Questran 1 scoop daily.  4.  Lovastatin 80 mg daily.  5.  Patient was on fosinopril 40 mg daily with recent change by Dr. Everardo All      with an unknown drug at this time.   FAMILY HISTORY:  Noncontributory.   SOCIAL HISTORY:  Patient lives with her husband.  She is independent in  activities of daily living.   PHYSICAL EXAMINATION ON ADMISSION:  VITAL SIGNS:  Temperature 97.6.  Orthostatics:  Blood pressure 140/71 standing, 128/66 sitting, 124/59  supine.  GENERAL APPEARANCE:  This is an obese black female in no acute distress.  HEENT:  Normocephalic and atraumatic.  Patient is edentulous.  She had no  oral lesions.  Conjunctivae and sclerae are clear.  NECK:  Supple.  There is no thyromegaly.  NODES:  No adenopathy is noted in the supraclavicular or inguinal regions.  LUNGS:  Patient had good breath sounds with anterior auscultation.  Posterior auscultation not performed.  CARDIOVASCULAR:  Radial pulses 2+.  She had irregular tachycardia.  She had  no murmurs, rubs or gallops.  She has a well-healed sternotomy scar.  ABDOMEN:  Obese.  Positive bowel sounds in all four quadrants.  She had  tenderness to palpation in the epigastrium.  No tenderness in the right  upper quadrant.  No significant tenderness in the lower quadrants.  She had  no guarding or rebound.  RECTAL:  Normal sphincter tone.  There is stool in the vault, which is heme-  negative.  GENITOURINARY:  Normal female external genitalia.  EXTREMITIES:  Patient is status post left BKA with chronic irritation on the  superior aspect of her stump.  Right lower extremity was normal.  NEUROLOGIC:  Nonfocal.   DATABASE:  Hemoglobin 13.2 gm, white count 6300 with 86% segs, 8% lymphs, 6%  monos.  Chemistries  with a sodium of 139, potassium 4.3, chloride 112, CO2  16, BUN 26, creatinine 1.2, glucose 265.  SGOT 66, SGPT 54.  The rest of her  labs were normal.  Urinalysis was negative.   ASSESSMENT/PLAN:  1.  Gastrointestinal:  The patient with abdominal pain, nausea, vomiting,      diarrhea.  Her family members have also been sick.  Suspect she has      gastroenteritis; however, with epigastric tenderness and coffee-ground      emesis, cannot rule out gastrointestinal bleed, despite heme-negative      stool.  A 24-hour observation and admission.  Will treat the patient      with IV fluids, rehydration.  Will repeat a hemoglobin in the morning.      Will put her on PPI and continue this at the time of discharge.  2.  Diabetes:  Patient is followed by Dr. [name].  She has been stable      on her present regimen of Humalog before meals.  Plan sliding scale      while in the hospital.  Will check A1C with her next lab draw.  3.  Cardiovascular:  The patient is followed by Dr. Dietrich Pates.  Last visit      was November 08, 2003.  The patient has otherwise been stable from a      cardiac perspective.  We will continue her home medications.  With no      indication of chest discomfort, pain, or any abnormalities on telemetry,      will not admit to a telemetry unit but a regular bed.       MEN/MEDQ  D:  08/21/2004  T:  08/21/2004  Job:  [id number]   cc:   Gregary Signs A. Everardo All, M.D. [facility]   Pricilla Riffle, M.D.

## 2010-07-28 NOTE — H&P (Signed)
[place]  Patient:    Angelica Lawrence, Angelica Lawrence Visit Number: [id number] MRN: [id number]          Service Type: FTC Location: FOOT Attending Physician:  Nadara Mustard Dictated by:   Lorain Childes, M.D. [facility] Admit Date:  01/07/2001 Discharge Date: 03/17/2001                           History and Physical  PRIMARY CARE PHYSICIAN: [name], M.D.  CARDIOLOGIST: Veneda Melter, M.D.  CHIEF COMPLAINT: Shortness of breath for one week.  ADMITTING ATTENDING: Luis Abed, M.D.  HISTORY OF PRESENT ILLNESS: The patient is a 77 year old African-American female, with known coronary disease, who is admitted with a one week history of progressive dyspnea, now with respiratory distress.  The patient reports increasing shortness of breath x1 week and dyspnea on exertion, also cough with white sputum.  No fever or chills.  Her shortness of breath and cough were worse when she was supine.  She was seen by her primary care physician on May 26, 2001 and told she had some allergies; however, she appeared to be doing fairly well.  Her shortness of breath continued throughout the week and last p.m. she was attempting to sleep and had profound shortness of breath with gurgling.  She felt like she could not catch her breath.  She woke her husband and he called EMS and she was brought in.  She denies any chest pain. No pressure, no palpitations.  She does repeat PND and orthopnea of two to three pillows.  Her weight has increased two to three pounds over the past couple of days.  EMS was called and she was brought to the emergency room.  In the ER she was found to be tachycardic in sinus rhythm to 130s.  Her blood pressure was 130/60.  Pulse oximetry was 50-60% on room air.  She was started on BiPAP with 15 liters oxygen.  She was given 80 mg IV Lasix and she had a quick response with 500 cc urine output.  She felt much improved and her pulse oximetry  improved to 96% with the above interventions.  She denies any sudden onset of dyspnea.  She does report mild lower extremity edema.  She denies again any chest pain or chest pressure, no tightness.  She reports no change in her diet and she has been compliant with her medications.  Her baseline weight is 204 pounds.  PAST MEDICAL HISTORY:  1. CAD with acute posterolateral myocardial infarction and catheterization in     September 2002 revealed LAD with 30-40% proximal mid, her D2 was mildly     diffuse with 30%, her left circumflex was 95%, AV groove with haziness;     she had a distal 30% lesion in her RCA, her PDA with 70% mid, small vessel     with diffuse 50-60% disease disease 30-40% and her ejection fraction was     noted to be 55%.  She had no mitral regurgitation.  She had mild     hypokinesis of the basal inferior posterior wall.  She underwent     percutaneous intervention of her left circumflex in the AV groove with 95%     reduced down to 0%, with a 3.0 x 8 mm Express II stent.  2. Hypertension.  3. Diabetes with nephropathy, neuropathy, and retinopathy.  4. Peripheral vascular disease, status post right fem-pop and left  BKA     about 15 years ago.  5. History of lower GI bleed in September 2002.  6. Hyperlipidemia.  7. Status post TAH/BSO in 1999.  MEDICATIONS:  1. Aspirin 81 mg p.o. q.d.  2. Lasix 40 mg p.o. q.d.  3. Protonix 40 mg p.o. q.d.  4. Plavix 75 mg p.o. q.d.  5. Altace 2.5 mg p.o. q.d.  6. Metoprolol 25 mg p.o. b.i.d.  7. Simvastatin 20 mg p.o. q.h.s.  8. Nitroglycerin p.r.n.  ALLERGIES: No known drug allergies.  She has no contrast dye allergy.  SOCIAL HISTORY: She lives here in [place], [place] with her husband. She has never been a smoker.  No alcohol.  No other drugs.  No herbal medications.  She does not do any regular exercise.  She tries to watch her diet and limit her salt intake.  FAMILY HISTORY: Negative for early coronary disease.   Her father had questionable prostate cancer.  REVIEW OF SYSTEMS: She denies any fever or chills.  No sweats.  No lymphadenopathy.  She does report weight gain over the past week.  She denies any headaches.  No visual changes.  No skin rashes.  GU: No urinary symptoms. No nausea, vomiting, diarrhea.  No bright red blood per rectum.  No hematemesis.  No melena.  PHYSICAL EXAMINATION:  VITAL SIGNS: Temperature 98.9 degrees, pulse 99, respirations 30, blood pressure 137/84.  Saturation 96% on BiPAP set at 10 and 5 with 15 liters.  Her urine output is now 2 liters.  GENERAL: She is in moderate respiratory distress, using accessory muscles.  HEENT: Normocephalic, atraumatic.  PERRL.  Mucous membranes moist.  NECK: Supple.  JVP is up to the angle of the jaw to her earlobe, approximately 15 cm.  She has 2+ carotid upstrokes.  There are carotid bruits.  No lymphadenopathy.  CARDIOVASCULAR: She is mildly tachycardic, regular rhythm.  She has an S3 and S4.  She has 1+ femoral pulses, with barely palpable right DP and PT.  Status post left BKA.  CHEST: Lungs have rales throughout with wheezing.  ABDOMEN: Soft, nontender, nondistended.  Normal bowel sounds.  No rebound or guarding.  She does have hepatojugular reflux.  EXTREMITIES: She has 1+ lower extremity edema on the right, status post left BKA.  NEUROLOGIC: She is alert and oriented x3.  Strength is 5/5 in upper and lower extremities.  LABORATORY DATA: Chest x-ray shows bilateral diffuse infiltrates.  EKG shows sinus rhythm, rate 96, left anterior fascicular block, lateral Q waves in I, aVL, and V6; T wave inversion in I, aVL and V6; Q waves in I and aVL; interventricular conduction delay.  Her EKG was compared to the one from September 2002 with no change.  Hemoglobin 11.8, hematocrit 35.7, WBC 11.8; platelet count 238,000.  Sodium 142, potassium 3.2, chloride 103, bicarbonate 26, BUN 14, creatinine 1.3, glucose 260, total  bilirubin 0.8, AST 36, ALT 23, alkaline phosphatase 96.  CK-MB and troponin, troponin 0.6, CK-MB pending.  ABGs in the emergency room showed a pH of 7.25, pCO2 46, pO2 68, saturation 96%, this on 15 liters.  Most recent ABGs 7.39, pCO2 44, pO2 388, saturation 100%.  ASSESSMENT/PLAN: This is a 77 year old woman with known coronary disease, admitted with marked respiratory distress, decompensated heart failure.  She is now improving with diuresis.  1. Congestive heart failure.  Patient had preserved systolic function by     cardiac catheterization previously.  Question whether there is some     diastolic dysfunction  versus ischemic etiology.  She currently has marked     volume overload.  Will admit her to the intensive care unit.  Will     continue on BiPAP for now and wean as tolerated with face mask and nasal     cannula as tolerated.  Her urine output has steadily increased in response     to the Lasix.  Placing an arterial line for close monitoring of her pO2.     If she decompensates she does wish to be intubated so will continue to     monitor closely.  Will continue afterload reduction with ACE inhibitor.     We are adding nitrates.  Will obtain an echocardiogram to assess her     ejection fraction.  Follow electrocardiograms and cycle cardiac enzymes.     Will check a brain natriuretic peptide.  2. Coronary artery disease.  Per above.  Continue her aspirin.  Start her     on nitroglycerin and enoxaparin.  Will hold her beta-blocker as she     currently is markedly decompensated.  Will cycle cardiac enzymes and     will pursue further ischemic work-up pending results of her     laboratories and as her respiratory status will tolerate.  3. Diabetes.  Monitored at home but no insulin or diabetic agents.  Will     cover her with sliding-scale and monitor.  We will check a hemoglobin A1C.  4. Peripheral vascular disease.  Patient status post below-the-knee     amputation and  femoropopliteal previously.  Currently stable with no     ulcerations.  5. History of gastrointestinal bleed.  Patient did have history of bleed     previously.  Will continue to monitor hematocrit closely.  6. Electrolytes.  Patients potassium is low at 3.2.  Will supplement.Dictated y:   Lorain Childes, M.D. [facility] Attending Physician:  Nadara Mustard DD:  06/01/01 TD:  06/02/01 Job: [id number]

## 2010-07-28 NOTE — Assessment & Plan Note (Signed)
[facility]                              CARDIOLOGY OFFICE NOTE   MAIANA, HENNIGAN                         MRN:          [id number]  DATE:11/29/2005                            DOB:          19-Sep-1926    IDENTIFICATION:  Ms. Duma is a 77 year old woman.  History of CAD (status  post CABG April 2003).  I last saw her back in July.   When I saw her last, she had some extra lower extremity swelling on the  right, I thought, and I recommended that she take Lasix every other day and  follow up with electrolytes.  She went on to have an echocardiogram.  This  was done in August early.  It was a very difficult study, but overall her  chamber sizes looked grossly normal.  LV function looked preserved.   In the interval, the patient says she has been doing very well.  She still  has the edema in her right leg, but it is not getting any worse.  She denies  shortness of breath.  No chest pressure.   CURRENT MEDICATIONS:  1. Atenolol 25 q. day.  2. Humalog as directed.  3. Cholestyramine powder 4 g q. day.  4. Benicar hydrochlorothiazide 40/25 q. day.  5. Lasix 20 q. day.  6. Omeprazole 20 q. day.  7. Potassium 20 b.i.d.  8. Zocor 80 q. day.   PHYSICAL EXAM:  On exam, the patient is in no distress.  Blood pressure is 144/63, pulse 82 and regular, weight stable at 231 pounds.  LUNGS:  Clear.  CARDIAC:  Exam is regular rate and rhythm.  S1 and S2.  No S3.  No  significant murmurs.  EXTREMITIES:  1 to 2+ edema in the right leg to the knee, status post left  BKA.   IMPRESSION:  1. Coronary artery disease status post coronary artery bypass grafting 4      years ago.  Clinically doing well.  I would not change her medical      regimen.  2. Dyslipidemia.  Will need to followup with lipid panel.  3. Diabetes.  Last sugar on a non-fasting lab was 161.  I did not check a      hemoglobin A1C.  This will need to be followed in primary care.   I will set up  to see the patient back in about 6 months, sooner if problems  develop.                                Pricilla Riffle, MD, [facility]   PVR/MedQ  DD:  11/30/2005 DT:  12/03/2005 Job #:  [id number]

## 2010-07-28 NOTE — Discharge Summary (Signed)
[place]. [facility]  Patient:    Angelica Lawrence, Angelica Lawrence Visit Number: [id number] MRN: [id number]          Service Type: MED Location: 2000 2021 01 Attending Physician:  Tressie Stalker Dictated by:   Adair Patter, P.A. Admit Date:  06/01/2001 Disc. Date: 07/01/01   CC:         [facility]   Discharge Summary  DATE OF BIRTH:  [birth year]  ADMITTING DIAGNOSIS:  Shortness of breath.  SECONDARY DIAGNOSES: 1. Right-sided pneumothorax. 2. Hypertension. 3. Diabetes mellitus.  DISCHARGE DIAGNOSES:  Coronary artery disease.  HOSPITAL COURSE:  The patient was admitted to [facility] on June 01, 2001 secondary to experiencing shortness of breath for approximately one week. Because of this she underwent cardiovascular workup.  She had cardiac catheterization done, which revealed she had significant coronary artery disease that was amenable to surgical correction.  Because of this, Dr. Cornelius Moras was consulted.  Dr. Cornelius Moras subsequently performed, on June 17, 2001, a coronary artery bypass graft x 3 with left internal mammary artery anastomosed to the left anterior descending artery, a saphenous vein graft to the first obtuse marginal artery, and a saphenous vein graft to the posterior descending artery using CryoVein.  No complications were noted during the procedure. Postoperatively the patient had a slow but progressive hospital course.  There was difficulty controlling her blood sugar.  Because of this the patient was put on a regimen of Humalog insulin and Lantus insulin.  Adequate CBG control was obtained with this.  Postoperatively she began to experience shortness of breath, and a chest x-ray at this time revealed that the patient had an increasing right-sided pneumothorax.  Because of this she had a right-sided thoracentesis for evacuation of air.  Followup chest x-ray after this procedure revealed that she had no more pneumothorax.  She also had a  fever postoperatively.  Because of this she was put on empiric Tequin and received the full course of that.  Her fever subsequently resolved without any further sequela.  She was subsequently deemed stable for discharge on July 02, 2001.  MEDICATIONS AT THE TIME OF DISCHARGE: 1. Ultram 50 mg one to two tablets every 4 to 6 hours as needed for pain. 2. Zocor 20 mg one daily. 3. Humalog insulin 19 units before breakfast, 19 units before lunch, and 17    units before dinner. 4. Aspirin 325 mg one daily. 5. Combivent inhaler two puffs four times daily. 6. Altace 2.5 mg one daily. 7. Lasix 40 mg one daily. 8. Coreg 6.25 mg twice daily. 9. Lantus insulin 15 units every evening.  ACTIVITY:  The patient was told to avoid driving, strenuous activity, or lifting heavy objects.  She was told to walk daily, continue breathing exercises.  DIET:  She is discharged on an 1800-calorie ADA diet.  WOUND CARE:  The patient was told she could shower and clean her incision with soap and water.  DISPOSITION:  Home.  FOLLOWUP:  The patient was told to call her cardiologist with [facility] for a two-week appointment.  She was told to see Dr. Cornelius Moras on Monday, Jul 28, 2001 at 10:45 a.m.  She was told to bring her chest x-ray with her to this appointment.  She was also told that the diabetes nutrition center will call her to set up an appointment when she is discharged.    Dictated by: Adair Patter, P.A. Attending Physician:  Tressie Stalker DD:  07/01/01 TD:  07/01/01  Job: [id number]

## 2010-07-28 NOTE — Cardiovascular Report (Signed)
[place]. [facility]  Patient:    Angelica Lawrence, Angelica Lawrence Visit Number: [id number] MRN: [id number]          Service Type: MED Location: 2000 2010 01 Attending Physician:  Lorain Childes Dictated by:   Rollene Rotunda, M.D. [facility] Proc. Date: 06/09/01 Admit Date:  06/01/2001   CC:         [name], M.D. [facility]  Dietrich Pates, M.D. [facility]   Cardiac Catheterization  DATE OF BIRTH:  1926-03-14  PRIMARY CARE PHYSICIAN:  [name], M.D.  CARDIOLOGIST:  Dietrich Pates, M.D.  PROCEDURE:  Left heart catheterization/coronary arteriography.  INDICATIONS:  Evaluate patient with a non-Q-wave myocardial infarction, known coronary disease status post percutaneous revascularization of her circumflex coronary artery in November 2002.  PROCEDURE NOTE:  Left heart catheterization was performed via the right femoral artery.  The artery was cannulated using an anterior wall puncture.  A #6 French arterial sheath was inserted via the modified Seldinger technique. Preformed Judkins and a pigtail catheter were utilized.  The patient tolerated the procedure well and left the lab in stable condition.  HEMODYNAMICS: 1. LV 122/14. 2. AO 119/54.  CORONARIES: 1. The left main was calcified with 30% stenosis.  2. The LAD had a proximal 95% shelf-like lesion followed by a 60% proximal    stenosis.  There was a very small first diagonal.  A second diagonal was    moderate size but narrow in caliber with moderate diffuse disease.  There    were diffuse luminal irregularities in the distal LAD.  3. The circumflex was occluded proximally at the previous percutaneous    revascularization site.  From previous films and via scant collateral    filling during this study, the mid obtuse marginals can be visualized and    are large vessels.  4. The right coronary artery is dominant.  It is somewhat narrow in caliber    tapering as small PDAs and posterolateral vessels.  There  is moderate to    severe diffuse disease in the mid to proximal segment with a focal proximal    80% stenosis.  LEFT VENTRICULOGRAM:  A left ventriculogram was obtained in the RAO projection.  The EF was approximately 35% with apical, anteroapical, and inferior hypokinesis.  CONCLUSIONS:  Severe three-vessel coronary artery disease with reduced left ventricular dysfunction.  PLAN:  Though high risk, I think the best option would be CABG.  We will consult cardiovascular surgeons for their opinion. Dictated by:   Rollene Rotunda, M.D. [facility] Attending Physician:  Lorain Childes DD:  06/09/01 TD:  06/09/01 Job: [id number]

## 2010-07-28 NOTE — Procedures (Signed)
[place]. [facility]  Patient:    Angelica Lawrence, Angelica Lawrence Visit Number: [id number] MRN: [id number]          Service Type: MED Location: 2000 2010 01 Attending Physician:  Angelica Lawrence Dictated by:   Angelica Lawrence, M.D. Proc. Date: 06/17/01 Admit Date:  06/01/2001   CC:         Anesthesia Department   Procedure Report  PROCEDURE:  Intraoperative transesophageal echocardiography.  INDICATION FOR PROCEDURE:  Ms. Angelica Lawrence is a 77 year old female patient of Dr. Bascom Lawrence who presents for coronary artery bypass grafting by Angelica Lawrence. Angelica Lawrence, M.D.  Due to a depressed cardiac function and possible mitral valve issues, the request has been made to place a TEE probe for evaluation of cardiac structure and function both pre- and post bypass.  DESCRIPTION OF PROCEDURE:  The patient was brought to the holding area the morning of surgery, where under local anesthesia with sedation radial arterial and pulmonary artery monitoring lines were inserted.  She was then taken to the operating room for routine induction of general endotracheal anesthesia. The trachea is intubated.  The TEE probe is then lubricated and passed oropharyngeally into the stomach, then withdrawn for imaging of the cardiac structures.  PRECARDIOPULMONARY BYPASS TRANSESOPHAGEAL ECHOCARDIOGRAPHY EXAMINATION:  Left ventricle:  This is a mildly thickened left ventricular chamber.  There is inferior and posterolateral wall hypokinesis, inferior slightly worse than posterolaterally.  There is global hypokinesis noted overall with a depressed ejection fraction, approximately 35% range.  Anterior, anterolateral, and septal walls appear to thicken and are contractile; otherwise, the LV chamber short axis view is symmetrical.  Papillary muscles are well-outlined.  Long axis reveals some depression of the apical thickening area.  Mitral valve:  This is a thickened mitral valve appareatus.  Both leaflets  ar thickened.  Both are seen well throughout their extent posteriorly and anteriorly.  There is an appearance of a ring which suggests annular calcium in the mitral valve.  It does appear to be slightly dilated.  On Doppler examination there is 1+ mitral regurgitant flow noted.  These are due to small multiple jets indicating or consistent with a dilatation of the annular ring itself.  There is no prolapse.  There are no flail leaflets.  Chordae are intact.  Left atrium:  This is a normal left atrial chamber.  The interatrial septum appears intact.  There are no masses noted within.  Aortic valve:  Mild aortic sclerosis is noted, but there are three leaflets. They open appropriately during systolic ejection and close appropriately without any indication on Doppler examination of any diastolic insufficiency.  Right ventricle:  Normal right ventricular chamber.  Tricuspid valve:  Normal, mobile tricuspid valve.  Right atrium:  Normal right atrial chamber.  The patient is placed on cardiopulmonary bypass, hypothermia is begun. Coronary artery bypass grafting is carried out by Dr. Cornelius Lawrence.  The patient is then rewarmed, separated from cardiopulmonary bypass with the initial attempt.  POSTCARDIOPULMONARY BYPASS TRANSESOPHAGEAL ECHOCARDIOGRAPHY EXAMINATION (LIMITED EXAM):  Left ventricle:  With the addition of inotropes it was a fairly vigorous left ventricular chamber in appearance in the postbypass period.  There were essentially no significant changes from the prebypass period.  The inferior and posterolateral wall remained hypokinetic compared to anterior and anteroseptal areas.  Overall there was good chamber size and appropriate contractility noted.  Mitral valve:  Essentially unchanged mitral valve apparatus.  The amount of regurgitant flow was less, if anything.  This would amount to no  more than 1+ or less.  The rest of the cardiac examination was as previously described  without any significant changes, and the patient was returned to the cardiac intensive care unit in stable condition. Dictated by:   Angelica Lawrence, M.D. Attending Physician:  Angelica Lawrence DD:  06/17/01 TD:  06/18/01 Job: [id number]

## 2010-07-28 NOTE — Discharge Summary (Signed)
[place]. [facility]  Patient:    Angelica Lawrence, Angelica Lawrence Visit Number: [id number] MRN: [id number]          Service Type: MED Location: [phone] Attending Physician:  Judeth Cornfield Dictated by:   Dianah Field, P.A. Admit Date:  11/19/2000 Disc. Date: 11/22/00   CC:         Angelica Petit T. Pleas Koch., M.D. [facility]   Discharge Summary  DATE OF BIRTH:  [birth year]  ADMISSION DIAGNOSES:  1. Acute lower gastrointestinal bleed.  Rule out postpolypectomy source for     bleeding versus diverticular source.  2. Status post colonoscopy with polypectomy on November 06, 2000 by     Dr. Russella Dar.  Pathology showed polypoid fragment with superficial ulceration     and inflamed granulation tissue in ulcer bed but no adenomatous changes     or malignancy identified.  3. Diverticulosis.  4. Anemia, normocytic, scheduled to gastrointestinal bleed.  5. Insulin-requiring type 2 diabetes mellitus.  6. Hypertension.  7. Hyperlipidemia.  8. Status post appendectomy, cholecystectomy, hysterectomy.  9. Peripheral vascular disease, status post left below-knee amputation. 10. History of cerebrovascular accident in 39.  DISCHARGE DIAGNOSES:  1. Acute lower gastrointestinal bleed, resolved.  Bleed secondary to     diverticular source.  2. Status post repeat colonoscopy by Dr. Melvia Heaps on November 20, 2000.     Although there was some residual red blood in the colon, no oozing or     specific source for the bleeding was found.  Polypectomy site in the     transverse colon was not found.  Colonoscopy was completed to the cecum.  3. Anemia, status post transfusion with 2 units of packed red blood cells     during this admission.  4. Insulin-reguiring diabetes mellitus.  Control fair during this     hospitalization.  5. Status post tagged red blood cell bleeding scan with no gastrointestinal     bleeding identified; however, there was some increased renal activity     within  a dilated right pelvis.  This dissipated over 90 minutes.  Renal     did not feel that would require further workup.  Urinalysis showed only     scant amount of microscopic red blood cells.  BRIEF HISTORY:  [name] is a very nice 77 year old African-American female whose primary care physician is Dr. Everardo All.  Dr. Russella Dar had performed a colonoscopy on November 06, 2000 because of family history of colon cancer and this was also performed to follow up polyps.  He did remove a polyp from the transverse colon.  She had stayed off of aspirin leading up to the colonoscopy and had stayed off of aspirin up until the time of this admission; however, on the day of admission, at about 4 a.m., she woke up with urge to defecate and some minor abdominal cramping.  When she moved her bowels, she saw some red blood associated with the bowel movement.  She returned to bed and then began noticing blood running down her leg.  She had another three episodes of bright red rectal bleeding of moderate volume.  This was not associated with anything other than some urgency abdominal cramping but no nausea, vomiting, chest pain, diaphoresis, syncope, or shortness of breath.  At 6 a.m., she arrived in the emergency room transported by her family.  She was somewhat orthostatic but not hypotensive.  She was admitted by Dr. Melvia Heaps for workup of what  most likely was felt to be postpolypectomy bleed but also could have been a diverticular bleed.  She had had extensive diverticulosis on the colonoscopy, such that Dr. Russella Dar had been unable to complete the study, and therefore she had been set up for a barium enema in the third week of September.  This had yet to be performed.  Again, she had not been using any nonsteroidal anti-inflammatories or aspirin products, as had been directed.  CONSULTATIONS:  No formal consultations.  PROCEDURES:  Colonoscopy per Dr. Arlyce Dice, as described above.  LABORATORY DATA:   Hemoglobin originally was 8.6 and after transfusion rose to 11.8 and then down to 9.2.  The hematocrit on arrival was 25.4, up to 35.2, and final assay was 28.1.  Platelets ranged from 195 up to 223.  MCV normal at 79.3.  Fecal occult blood was positive.  Chemistries with sodium 140, potassium 4.6.  Glucose 134, BUN 31, and creatinine 1.2.  Total bilirubin 0.6, alkaline phosphatase 74, AST 20, ALT 15, albumin 3.3.  PT was 15.3, INR was 1.3, and PTT was 30.  UA showed 3-6 white blood cells, 0-2 red blood cells, and rare bacteria.  No nitrites and no leukocyte esterase.  IMAGING STUDIES:  Nuclear medicine tagged red blood cell scan showed no evidence for active GI bleeding over the 90 minutes of the scan.  There was some focal increased activity in the right abdomen likely corresponding in location, as well as appearance, to activity in dilated renal pelvis.  This renal activity dissipated during the 90 minutes of imaging.  Two-view chest showed mild tortuosity of the descending aorta, which was stable in appearance.  There was no infiltrate or congestive heart failure.  HOSPITAL COURSE:  The patient originally presented to the [facility] ER; however, because of the bed situation, she ultimately was transferred to [facility].  Initially, she was placed in 5731.  She was not originally assigned to a telemetry bed; however, ultimately she was transferred to telemetry.  The patient received 2 units of packed red blood cells within the first 8-10 hours of her admission.  Her hemoglobin went from 8.6 ultimately up to 11.8, at which point it started to slowly descend a bit.  Following these initial transfusions, she was not transfused any further.  For the first 24 hours, the patient did not have any further bleeding per rectum; however, on the morning of hospital day #2, September 11, she started having bloody stools again.  At that point, Dr. Arlyce Dice chose to recolonoscope her.  It was to  find to polypectomy site and that if this were bleeding to manage it with hemostatic  therapy; however, during colonoscopy, he was not able to find even the remnant of the polypectomy site, nor any active bleeding site.  The patient did tolerate her bowel prep quite well and tolerated the procedure without incident.  Following purging of the bowel with GoLYTELY and the subsequent colonoscopy, the patient had no further gross bleeding.  She did pass some dark old blood but ultimately her stools cleared to a greenish color, which, though it was heme positive, did not represent any recurrent acute bleeding.  Her aspirin therapy was held further, as she had been off it since prior to the colonoscopy.  The plan was not to restart this until September 25.  The patient had been scheduled for a barium enema to evaluate the right side of the colon, which Dr. Russella Dar had not been able to reach during his  colonoscopy; however, Dr. Arlyce Dice was able to get through the right colon into the cecum and found only further diverticulosis.  Therefore, there was no further need for the barium enema and this was cancelled.  On September 13, the patients hemoglobin had dropped down to 9.2.  The day prior, the patient had been a little bit weak and felt a little bit dizzy.  She was not having any chest pain or irregularity on the telemetry monitor.  Therefore, it was elected not to transfuse her.  A bleeding scan had been performed late on September 11 and this was negative.  The bleeding scan, however, did show some increased renal activity.  The report was discussed with renal physician, Dr. Arrie Aran.  He said that this may represent a renal AVM but, as long as she did not have significant hematuria either grossly or on urinalysis, he did not feel that it would require formal consultation or further workup.  Urinalysis indeed showed only 0-2 red blood cells and rare bacteria.  No further renal workup  was therefore pursued.  By hospital day #4, she was stable.  She had begun to pass greenish dark stools, which were heme positive but not bloody.  She was eating a low residue diabetic diet without incident and was felt stable for discharge to home that afternoon.  She was instructed to call Dr. Anselm Jungling office if she saw any rectal bleeding, as which point she could be triaged either to be seen in the office or to return to the emergency room.  Again, she was told not to restart her aspirin until September 25.  She had an appointment to see Dr. Russella Dar at the office on Monday, September 30, at 10 a.m.  DISCHARGE MEDICATIONS: 1. Atacand 8 mg daily. 2. Questran 2 scoops daily. 3. Actos 30 mg daily. 4. Tiazac 360 mg daily. 5. Lasix 20 mg p.o. q.d. 6. Altace 10 mg p.o. q.d. 7. Humalog insulin 20 units subcu in the a.m. and at 3 p.m. and 10 units at    h.s.  ACTIVITY:  The patient normally does not do any kind of significant exertion, as she does have the amputation.  She does not even vacuum or do any cleaning. Therefore, she was to resume her normal level of minimal activity at home.  DIET:  Her diet was to be according to diabetic restrictions in place prior to this admission.  FOLLOW-UP:  She did not need to keep her appointment for the barium enema which had been set up and this was cancelled.  She had an appointment with Dr. Russella Dar on Monday, September 30. Dictated by:   Dianah Field, P.A. Attending Physician:  Judeth Cornfield DD:  11/22/00 TD:  11/22/00 Job: [id number]

## 2010-07-28 NOTE — Discharge Summary (Signed)
NAMECHANTALLE, Lawrence                  ACCOUNT NO.:  [id number]   MEDICAL RECORD NO.:  [id number]          PATIENT TYPE:  OBV   LOCATION:  1403                         FACILITY:  [facility]   PHYSICIAN:  Angelica Lawrence, M.D. LHCDATE OF BIRTH:  1927-02-28   DATE OF ADMISSION:  08/21/2004  DATE OF DISCHARGE:  08/22/2004                                 DISCHARGE SUMMARY   DISCHARGE DIAGNOSES:  1.  Nausea, vomiting and diarrhea likely infectious gastroenteritis,      resolved.  2.  Mild dehydration secondary to above status post IV hydration.  3.  Type 2 diabetes slight exacerbation secondary to acute infection. A1C      7.3.  4.  History of hypertension.  5.  History of coronary disease.  6.  History of dyslipidemia.  7.  History of lower gastrointestinal bleed remote past.  8.  History of cerebrovascular accident remote, greater than 20 years with      mild left upper arm sequelae.  9.  Status post left below knee amputation greater than 20 years.  10. Peripheral vascular disease status post right fem-pop bypass remote.  11. Status post hysterectomy.  12. Status post cholecystectomy.  13. Status post appendectomy.   DISCHARGE MEDICATIONS:  1.  Discharge medications are as prior to admission without change and      include Humalog 35 units a.m., 40 units lunch, 40 units supper.  2.  Atenolol 25 mg p.o. q. a.m.  3.  Questran one scoop p.o. q.d.  4.  Lovastatin 80 mg p.o. q.h.s.  5.  Antihypertensive question ACE inhibitor versus to admission.   CONDITION ON DISCHARGE:  Medically improved and stable.  Hemodynamically  stable throughout admission.  Tolerating p.o. without further nausea,  vomiting, diarrhea during observation admit.   DISPOSITION:  The patient is being discharged home where she lives with her  husband.  She continues to be independent with her activities of daily  living and understands plans to call primary MD for follow-up in two weeks  or as-needed.   HOSPITAL  COURSE:  Problem 1. Gastroenteritis.  The patient is a pleasant 77-  year-old woman with diabetes who on the night prior to presentation  developed abdominal cramping with vomiting and diarrhea. Because of these  symptoms throughout the day presented to the emergency room at [facility]  for further evaluation.  Chemistries and CBC were normal but because of  glucose in the upper 200s with question of mild dehydration, the patient was  recommended for observation admission.  She was treated with IV fluids both  in the ER and overnight and begun on a clear liquid diet the following  morning which she has tolerated well.  The patient has had no further  vomiting, diarrhea or abdominal pain since admission and should she tolerate  a regular diabetic diet at lunch is stable for discharge home. Recommend no  further changes in medications or management.  There was some question of GI  bleed as some of her vomitus was dark bile but as the patient has had no  recurrent vomiting  and diarrhea has  remained hemodynamically stable and follow-up hemoglobin was without change.  No further workup has been done to this regard. Laboratory data at time of  discharge hemoglobin of 12.5, white count 6.3, platelets normal at 193. The  basic metabolic within normal limits except for a glucose of 203, creatinine  of 1.0.       VL/MEDQ  D:  08/22/2004  T:  08/22/2004  Job:  [id number]

## 2010-07-28 NOTE — H&P (Signed)
[place]. [facility]  Patient:    JAE, BRUCK Visit Number: [id number] MRN: [id number]          Service Type: SUR Location: [id number] Attending Physician:  Tressie Stalker Dictated by:   Sherrie George, P.A. Admit Date:  08/22/2001                           History and Physical  DATE OF BIRTH:  Jul 28, 1926  HISTORY OF PRESENT ILLNESS:  The patient is a 77 year old black female who presented with  non-Q-wave myocardial infarction and subsequently was worked up and underwent a coronary artery bypass grafting on June 17, 2001.  Despite multiple premorbid conditions, she had a fairly benign postoperative course, and was discharged home on July 02, 2001.  Since then she has done well and has made a remarkably good recovery.  Yesterday she developed a fever.  She was seen by the home health nurse, and examination showed swelling in her right venectomy site.  She was referred to CVTS, and was seen in the office by Dr. Kathlee Nations Trigt III.  Her temperature in the office was 102.3 degrees. His examination confirmed that she had a large fluid collection in the right thigh.  It was his opinion that she had a cellulitis and probable infected right thigh venectomy site.  She is admitted at this time for IV antibiotics along with probable drainage of her thigh, with further treatment as indicated.  PAST MEDICAL HISTORY 1. Coronary artery disease with recent myocardial infarction. 2. Hypertension. 3. Adult onset diabetes mellitus with neuropathy, nephropathy, and    diabetic retinopathy. 4. Hyperlipidemia. 5. History of lower GI bleed. 6. Status post left below-the-knee amputation 20 years ago.  She    currently has a functional prosthesis. 7. Status post right femoral-popliteal bypass graft, although I do    not have any information on that in her current chart. 8. History of a CVA 20 years ago, where the left upper arm was effected.  PAST SURGICAL  HISTORY 1. Right femoral-popliteal bypass graft. 2. Left below-the-knee amputation. 3. She has had a CABG in April 2003. 4. She has a history of a hysterectomy. 5. History of a cholecystectomy.  CURRENT MEDICATIONS 1. Humalog 19 units q. breakfast, 19 units q. lunch, 17 units q. supper,    and at bedtime she takes 15 units of Lente insulin.  Her sugars have    been in the low 100s prior to today. 2. Aspirin 325 mg one q.d. 3. Lasix 40 mg q.d. 4. Coreg - she thinks the dose is 3.125 mg q.d. 5. Altace.  She says the dose is 10 mg q.d.  Will check that with the    [facility] records.  ALLERGIES:  No known drug allergies.  REVIEW OF SYSTEMS:  Positive for diabetes mellitus and old CVA.  No recent angina since her myocardial infarction.  She has a history of congestive heart failure but none recently.  She denies any shortness of breath, dyspnea on exertion, new chest pain.  No PND and no orthopnea.  FAMILY HISTORY:  See the old chart.  PHYSICAL EXAMINATION  GENERAL:  The patient is a well-nourished, overweight black female who obviously feels very badly.  VITAL SIGNS:  Temperature 102.3 degrees when she came in, and she is still markedly febrile.  Respirations 20, heart rate tachycardic with a rate of around 120, blood pressure when she got  here 116/68.  HEENT:  Normocephalic head.  Eyes:  PERRLA.  EOMs intact.  Fundi not visualized.  Ears, nose, throat, mouth:  Grossly within normal limits.  NECK:  No bruits, no lymphadenopathy.  CHEST:  Clear to auscultation and percussion.  ABDOMEN:  Soft, nontender.  Positive bowel sounds.  No palpable organomegaly. She has a well-healed sternotomy incision.  She has a right upper quadrant scar and a midline scar below the umbilicus on her abdomen.  GENITOURINARY:  Not performed.  RECTAL:  Not performed.  EXTREMITIES:  She has a fluid collection in the right thigh at venectomy site. A left below-the-knee amputation,  prosthesis in place.  PULSES:  No pulses were felt in the femoral, popliteal, DP, or PT on the right.  She has a left below-the-knee amputation with prosthesis in place.  NEUROLOGIC:  No focal deficits.  IMPRESSION 1. Infected right thigh venectomy site. 2. Status post myocardial infarction with coronary artery bypass grafting    in April 2003. 3. Adult onset diabetes mellitus, insulin-dependent, with nephropathy,    neuropathy, and retinopathy. 4. Hyperlipidemia. 5. Hypertension. 6. History of CVA with left upper arm symptoms, resolved. 7. Status post right femoral-popliteal bypass graft and left    below-the-knee amputation.  PLAN:  The patient is admitted for IV antibiotics and drainage of her thigh incision with further treatment as indicated. Dictated by:   Sherrie George, P.A. Attending Physician:  Tressie Stalker DD:  08/22/01 TD:  08/25/01 Job: [id number]

## 2010-07-28 NOTE — H&P (Signed)
[place]. [facility]  Patient:    Angelica Lawrence Visit Number: [id number] MRN: [id number]          Service Type: MED Location: MICU [id number] Attending Physician:  Dolores Patty Dictated by:   Titus Dubin. Alwyn Ren, M.D. [facility] Admit Date:  02/02/2001   CC:         [name], M.D. [facility]   History and Physical  HISTORY OF PRESENT ILLNESS:  Angelica Lawrence is a 77 year old African-American female with coronary artery disease, diabetes, and intermittent heart failure, who presents with congestive heart failure clinically and radiographically with anginal type chest pain.  Her weight has been going up for one or two weeks, despite any change in her diet according to her.  She emphatically denies that she is cooking with salts or adding at the table.  Her weight is monitored by [employer]. Apparently the scale they have provided relays weight to [facility].  The outpatient nurses contacted her and suggested she see her physician.  She has not done that.  She woke up approximately 8 a.m. with paroxysmal and nocturnal dyspnea with wheezing.  This was associated with heaviness substernally.  She took three nitroglycerin but got no relief, prompting transport to the emergency room by EMS.  There was no nausea or diaphoresis.  She denies any cough or sputum, abdominal pain, melena, or rectal bleeding, or heartburn.  PAST MEDICAL HISTORY:  Her past medical history is long and complicated.  She was just discharged from the hospital September 24 two months ago.  She was hospitalized for six days following an acute posterolateral wall myocardial infarction.  This was complicated by congestive heart failure and urinary tract infection.  She had an emergent catheterization with successful stenting of two vessels. Cardiac rehab was initiated following the stenting.  Other past history includes:  1. Oral medication and diet treated diabetes  complicated by retinopathy and     neuropathy.  2. She also has dyslipidemia.  3. She has peripheral vascular disease.  4. She has had a below the knee amputation on the left.  5. Apparently she has also had a fem-pop bypassing.  6. Remotely she has had cerebrovascular accident.  7. She has had colon polyps removed.  8. Her GI history also includes positive H. pylori treated December of 1998.  9. She has carpal tunnel syndrome. 10. She has had bilateral cataract surgeries. 11. She denies any diabetic retinopathy or laser treatment, although the     history and physical suggests this. 12. Total abdominal hysterectomy with bilateral salpingo-oophorectomy in     1997; she is unable to tell me why. 13. She has had an appendectomy. 14. She has had a cholecystectomy. 15. She has had corneal transplant.  SOCIAL HISTORY:  She does not smoke or drink.  She lives with her husband.  FAMILY HISTORY:  Positive for diabetes in aunts and children.  Her son had colon cancer.  Her father had cancer, probably prostate cancer.  REVIEW OF SYSTEMS:  Negative for any bleeding dyscrasias or GI or gynecologic symptomatology.  HOME MEDICATIONS:  1. Lasix 40 mg daily.  2. Protonix 40 mg daily.  3. Plavix 75 mg daily.  4. Actose 30 mg daily.  5. Baby aspirin 81 mg.  6. Altace 2.5 mg b.i.d.  7. Metoprolol 50 mg one half tablet twice a day.  8. Nitroglycerin 0.4 as needed.  9. Questran Light one scoop three times a day. 10. Additionally she  is on Humalog insulin 20 units before breakfast and lunch     and 10 units in the evening.  When asked about diet, she cannot give a     specific description as to type of diet.  PHYSICAL EXAMINATION:  GENERAL:  At this time she appears only mildly dyspneic.  She has a sinus tachycardia with grade of 100-110 on telemetry.  VITAL SIGNS:  Blood pressure is 120/63.  She is afebrile.  Respiratory rate is 24-28.  O2 saturations were 97% on room air; she is now on  nasal O2.  HEENT:  She is edentulous except for a few remaining mandibular teeth.  There is increased cerumen in the canals.  Irregular hyperpigmentation is noted in both fundi, suggesting prior laser surgery.  She has arterial narrowing.  LUNGS:  Breath sounds are decreased; she has distant S3 gallop.  ABDOMEN:  Masses with scattered ecchymoses over the abdomen.  Apparently this is related to her insulin injections.  As stated, she denies any other bleeding dyscrasias.  EXTREMITIES:  Pedal pulses are decreased in right lower extremity.  There is an artificial limb in the left lower extremity.  Right upper quadrant operative scar and operative scars over the right lower extremity were present.  She has half plus edema on the right.  NEUROLOGIC:  There are no localizing neurologic signs.  Neuropsychiatric exam reveals lack of insight.  He has exhibited by some comments above and suggests some memory deficits, particularly in reference to past medical history.  She definitely lacks understanding of the risks for progressive coronary artery disease.  LABORATORY:  EKG reveals ST/T-wave nonspecific changes with sinus tachycardia. There is intraventricular conduction delay.  Chest x-ray showed pulmonary edema.  CPK is 179 with an MB of 6.3 and 3.5% fraction.  Troponin is 1.01.  Hematocrit was 27, it was 28.2 on November 29, 2000.  Glucose was 156, BUN 26, and creatinine 1.2.  Hepatic enzymes were normal.  She will be admitted to the coronary care unit for congestive heart failure and angina.  Cardiology will be consulted.  Her home medications will be continued with the exception of Actose.  She will receive sliding scale in addition to her Humalog. Dictated by:   Titus Dubin. Alwyn Ren, M.D. [facility] Attending Physician:  Dolores Patty DD:  02/02/01 TD:  02/02/01 Job: [phone]

## 2010-07-28 NOTE — Cardiovascular Report (Signed)
[place]. [facility]  Patient:    Angelica Lawrence, Angelica Lawrence Visit Number: [id number] MRN: [id number]          Service Type: MED Location: [id number] Attending Physician:  Dolores Patty Dictated by:   Daisey Must, M.D. [facility] Proc. Date: 02/07/01 Admit Date:  02/02/2001   CC:         [name], M.D. [facility]  Titus Dubin. Alwyn Ren, M.D. [facility]  Bea Laura Graceann Congress, M.D. [facility]   Cardiac Catheterization  PROCEDURES PERFORMED: 1. Left heart catheterization with coronary angiography and left    ventriculography. 2. Percutaneous transluminal coronary angioplasty utilizing the Cutting    Balloon of the proximal left circumflex coronary artery.  INDICATIONS: The patient is a 77 year old woman with a history of previous stent placement in the proximal left circumflex in September of this year for an acute myocardial infarction.  She represented to the hospital several days ago with recurrent substernal chest pain and congestive heart failure.  She had elevated troponins consistent with a non-Q-wave myocardial infarction. She also had significant anemia. Upper endoscopy failed to identify any source of blood loss. She was transfused and after adequate period of stabilization was referred for cardiac catheterization.  CATHETERIZATION  PROCEDURE: A 6 French sheath was placed in the right femoral artery. Coronary arteriography was performed with standard Judkins 6 French catheters. Left ventriculography was performed with a 6 French angled pigtail. Contrast was Omnipaque. There were complications.  CATHETERIZATION RESULTS:  HEMODYNAMICS: Left ventricular pressure 140/16. Aortic pressure 140/60.  There was no aortic valve gradient.  LEFT VENTRICULOGRAM: There is severe hypokinesis of the inferior wall and mild akinesis of the apex. Ejection fraction is calculated at 44%. There is 2+ mild mitral regurgitation.  CORONARY ARTERIOGRAPHY: (Right dominant). The  coronary arteries are relatively small caliber and diffusely calcified.  Left main: Left main has a distal 30% stenosis.  Left anterior descending: The left anterior descending artery has a 30% stenosis in the proximal vessel and a 30% stenosis in the mid vessel.  The LAD gives rise to a small sized first diagonal and normal sized second diagonal.  Left circumflex: The left circumflex is very heavily calcified throughout. In the proximal vessel, there is a stent with a 95% in-stent re-stenosis. BEyond the stent is a 60% stenosis on a sharp bend.  This is in the mid vessel. In the distal vessel there is a 70% stenosis between the second and third obtuse marginal branches. The circumflex gives rise to a small OM-1 which arises just proximal to the stent. There is a normal sized OM-2 and a normal sized OM-3. OM-3 has a 40% stenosis in the mid vessel.  Right coronary artery: The right coronary artery is a small caliber vessel with diffuse disease. There is a 50% stenosis in the proximal vessel. The mid vessel has a diffuse 60-70% stenosis. The distal vessel has a 20% stenosis. The distal right coronary artery gives rise to a normal sized posterior descending artery, and two small posterolateral branches.  IMPRESSIONS: 1. Moderately decreased left ventricular systolic function with mild mitral    regurgitation. 2. Two-vessel coronary artery disease as described. The culprit lesion has a    95% in-stent re-stenosis in the proximal left circumflex. There is moderate    disease in the distal circumflex as well as in the mid right coronary    artery which is stable in comparison to previous catheterization.  PLAN: Percutaneous intervention of the left circumflex. See below.  PERCUTANEOUS TRANSLUMINAL CORONARY ANGIOPLASTY PROCEDURE: Following completion of the diagnostic catheterization, we opted to proceed with percutaneous coronary intervention. The 6 French sheath in the right femoral  artery was exchanged over a wire for a 7 [place] sheath. Because of the patients recent problems with anemia and possible GI bleeding, we used heparin only with no IIb/IIIa inhibitor. The left circumflex arises from a very sharp angle off the left main coronary artery and there is a sharp bend in the circumflex just beyond the stented portion. There is also very heavily calcified coronary. Because of that, it does not appear that this artery would accommodate the catheter required to delivery intracoronary brachytherapy. We therefore opted to treat this with balloon angioplasty alone. We used a 7 [place] Voda left 3 guiding catheter and a BMW wire. With some difficulty we were able to advance a 3.25 x 6 mm  Cutting Balloon across the area of stenosis within the stent. We performed four inflations through the length of the stent, and the first was to 8 atmospheres and then the subsequent three were each to 10 atmospheres. Final angiographic images revealed patency of the proximal left circumflex with less than 20% residual stenosis and TIMI-3 flow.  COMPLICATIONS: None.  RESULTS: Successful percutaneous transluminal coronary angioplasty utilizing the Cutting Balloon of the proximal left circumflex. A 95% in-stent re-stenosis was reduced to less than 20% residual with TIMI-3 flow.  PLAN: The patient will be continued on aspirin and Plavix indefinitely. As described above, I would anticipate extreme difficulty in attempting to treat this vessel with brachytherapy should she have recurrent re-stenosis Dictated by:   Daisey Must, M.D. [facility] Attending Physician:  Dolores Patty DD:  02/07/01 TD:  02/07/01 Job: [id number]

## 2010-07-28 NOTE — Discharge Summary (Signed)
[place]. [facility]  Patient:    NICKCOLE, BRALLEY Visit Number: [id number] MRN: [id number]          Service Type: MED Location: [phone] Attending Physician:  Dolores Patty Dictated by:   Janora Norlander, N.P. Admit Date:  11/27/2000 Discharge Date: 12/03/2000   CC:         [name], M.D. [facility]  Doylene Canning. Ladona Ridgel, M.D. [facility]  Veneda Melter, M.D.   Discharge Summary  ADMITTING DIAGNOSIS:  Chest tightness possibly due to angina.  DISCHARGE DIAGNOSES: 1. Acute posterior lateral wall myocardial infarction. 2. Two-vessel coronary artery disease. 3. Congestive heart failure. 4. Urinary tract infection.  SIGNIFICANT PROCEDURES DURING ADMISSION:  11/27/00, cardiac catheterization performed by Dr. Veneda Melter.  Final results, successful PTCA and stenting of the proximal AV circumflex with reduction of 99.5% narrowing to 0% with placement of 3.0 x 8 mm Express stent.  Chest x-ray on 12/02/00 demonstrates improving edema with some residual pulmonary edema remaining.  There are small to moderate sized bilateral effusions.  LABORATORY:  12/02/00, Sodium 136, potassium 4.5, glucose 249, BUN and creatinine 16 and 0.9.  Urine culture is showing over 100,000 colonies of mixed bacteria.  CBC on 11/29/00, WBC 8.9, H&H 9.5 and 28.2, respectively. Lipid profile on 11/28/00, cholesterol 148, triglycerides 72, HDL 64, LDL 70.  BRIEF HISTORY OF HOSPITALIZATION:  Ms. Staples was admitted and found to be having a non-Q-wave MI.  Cardiology was consulted, and she had an emergent catheterization with successful stenting.  Cardiac rehab was also initiated where factors such risk factors, precautions, nitroglycerin use, 911, daily weights, low sodium, low fat, low cholesterol diet and exercises were reviewed.  This program continued up until discharge and at point of discharge, the patient was ambulating with cardiac rehab down the hallway using her  prosthesis and a walker.  The patient state that she has a list of exercises that she is to continue to reproduce at home.  During admission, her cardiac symptoms had been improving with no chest pain and no shortness of breath; however, she did have low-grade fever.  Her urine was cultured and demonstrated multiple bacterial growth, possibly a contaminant but given her other medical issues, we decided to put her on Macrobid for a 10 day course as listed below in the discharge medications.  EXAMINATION ON DISCHARGE:  VITAL SIGNS:  Temperature 99.4, blood pressure 96/60, pulse 94, respirations 20, 98% on room air.  Her CBGs today have ranged from 235-171.  Her INO is minus 1575 for the past eight hours.  Her chest x-ray of 12/02/00, as above, shows improving edema with some residual pulmonary edema remaining.  Small to moderate size effusions.  GENERAL:  She is in good spirits.  She is eager to go home.  She is awake, alert, and oriented, pleasant and verbal.  HEART:  Regular rate and rhythm.  LUNGS:  Clear to auscultation.  EXTREMITIES:  She has her prosthesis on, and there is no edema present in the other extremity.  ASSESSMENT AND PLAN: 1. Myocardial infarction.  She is to continue on the following medications per    cardiology.  Plavix, Questran, aspirin, Altace, and Lopressor. 2. We will return her to her at-home regimen for diabetes. 3. For her urinary tract infection, she will be placed on Macrobid as listed    below.  DISCHARGE MEDICATIONS:  1. Plavix 75 mg 1 p.o. q.d.  2. Questran 4 gram packets t.i.d. as instructed.  3. Actos 45 mg 1 p.o. q.d.  4. Protonix 40 mg 1 p.o. q.d.  5. Altace 2.5 mg 1 p.o. b.i.d.  6. Lasix 40 mg 1 p.o. q.d.  7. Lopressor 25 mg 1 p.o. b.i.d.  8. Macrodantin 50 mg 1 p.o. q.i.d.  Last dose December 11, 2000.  9. Humalog as per home dose of q.a.c. 20-20-10. 10. Enteric-coated baby aspirin 81 mg q.d. 11. Sublingual nitroglycerin 0.4 mg 1 q 5  minutes x 3.  [facility] will follow up with her cardiovascular and CHF management and provide diabetes and CHF teaching at home.  They have been contacted upon the patients discharge, and the patient is to follow up with Dr. Everardo All in 6-8 weeks.  STATUS AT DISCHARGE:  Presently, the patients telemetry reads at normal sinus rhythm.  The patient is awake, alert and oriented and stable and ready for discharge. Dictated by:   Janora Norlander, N.P. Attending Physician:  Dolores Patty DD:  12/03/00 TD:  12/03/00 Job: [phone] [id number]

## 2010-07-28 NOTE — Op Note (Signed)
[place]. [facility]  Patient:    Angelica Lawrence, Angelica Lawrence Visit Number: [id number] MRN: [id number]          Service Type: MED Location: 2000 2010 01 Attending Physician:  Lorain Childes Dictated by:   Salvatore Decent. Cornelius Moras, M.D. Proc. Date: 06/17/01 Admit Date:  06/01/2001   CC:         Rollene Rotunda, M.D. [facility]  Dietrich Pates, M.D. [facility]  Veneda Melter, M.D.  [name], M.D. [facility]  Molly Maduro D. Arlyce Dice, M.D. [facility]   Operative Report  PREOPERATIVE DIAGNOSES:  Severe three vessel coronary artery disease status post acute non Q wave myocardial infarction.  POSTOPERATIVE DIAGNOSES:  Severe three vessel coronary artery disease status post acute non Q wave myocardial infarction.  PROCEDURE:  Median sternotomy for coronary artery bypass grafting x3 (left internal mammary artery to distal left anterior descending coronary artery, saphenous vein graft to first circumflex marginal branch, cryopreserved saphenous vein graft to posterior descending coronary artery).  SURGEON:  Salvatore Decent. Cornelius Moras, M.D.  ASSISTANT:  Mr. Lissa Hoard.  ANESTHESIA:  General.  BRIEF CLINICAL NOTE:  The patient is a 77 year old African-American female from  [place] followed by Dr. [name] and Dr. Dietrich Pates and referred by Dr. Rollene Rotunda for management of coronary artery disease. The patient has a history of hypertension, severe peripheral vascular disease, type 2 diabetes mellitus, hyperlipidemia, and cerebrovascular disease. She has undergone bilateral lower extremity surgical revascularization in the distant past and ultimately underwent left below-the-knee amputation. She was admitted to the hospital on June 01, 2001 having suffered another acute non Q wave myocardial infarction. Cardiac catheter demonstrates severe three vessel coronary artery disease with mild to moderate left ventricular dysfunction. A full consultation note has been dictated previously. Of note, the  patient is felt not to be a candidate for radial artery harvest and procurement as possible conduit for bypass grafting because she has very diffuse peripheral vascular disease with very poor radial artery pulsations in both forearms.  OPERATIVE CONSENT:  The patient and her family have been counselled at length regarding the indications and potential benefits of coronary artery bypass grafting. Alternative treatment strategies have been discussed. Possible choices for bypass graft conduits have been discussed and the relative strengths and weaknesses of each outlined. All of their questions have been addressed. They understand and accept all associated risks of surgery including but not limited to risks of death, stroke, myocardial infarction, respiratory failure, acute renal failure, bleeding requiring blood transfusion, arrhythmia, infection, and recurrent coronary artery disease. All of their questions have been addressed.  DESCRIPTION OF PROCEDURE:  The patient is brought to the operating room on the above mentioned date and invasive hemodynamic monitoring is established by the anesthesia service under the care and direction of Dr. Sharee Holster. Specifically, a Swan-Ganz catheter is placed through the right internal jugular approach. A right radial arterial line is placed, although the peripheral wave form is noted to be somewhat dampened due to the patients severe peripheral vascular disease. Intravenous antibiotics are administered. The patient is placed in the supine position on the operating table. Following induction with general endotracheal anesthesia, the patients chest, abdomen, both groins, and lower extremities are prepared and draped in a sterile manner.  A median sternotomy incision is performed and the left internal mammary artery is dissected from the chest wall and prepared for bypass grafting. The left internal mammary artery is notably a good quality conduit.  Simultaneously a saphenous vein is obtained from  the patients right side through a longitudinal incision. The saphenous vein is felt to be a fair quality conduit. However, the saphenous vein stops midway down the thigh on the right side such that only one good segment of vein is available for grafting. An additional segment of cryopreserved allograft vein is subsequently prepared for recommendations of the manufacturer for grafting purposes. The patient is heparinized systemically.  The pericardium is opened. The ascending aorta is cannulated for cardiopulmonary bypass without difficulty. The venous cannula is placed through the right atrial appendage. Adequate heparinization is verified. Cardiopulmonary bypass is begun and the surface of the heart is inspected. There is mild to moderate left ventricular hypertrophy. There is diffuse distal coronary artery disease with severely calcified coronary arteries. Portions of both saphenous vein segments and the left internal mammary artery are trimmed to appropriate lengths. A temperature probe is placed in the left ventricular septum and a Styrofoam pad is placed to protect the left phrenic nerve from thermal injury. A cardioplegic catheter is placed in the ascending aorta.  The patient is cooled to 32 degrees systemic temperature. The aortic cross clamp is applied and cardioplegia is delivered in an antegrade fashion through the aortic root. Ice saline slush is applied for topical hypothermia. The initial cardioplegic arrest and myocardial cooling are felt to be excellent. Repeat dosage of cardioplegic are administered intermittently throughout the cross clamp portion of the operation both through the aortic root and down  subsequently placing graft to maintain septal temperature below 15 degrees centigrade. The following distal coronary anastomoses are performed:   1. The posterior coronary artery is grafted using cryopreserve human      allograft vein in an end to side fashion. The distal anastomosis is     placed immediately beyond the bifurcation from the distal right coronary     artery. The coronary artery measures 1.3 mm at the site of the distal     bypass and is a poor quality.  2. The first circumflex marginal branch is grafted using the patients     reversed saphenous vein conduit in an end to side fashion. This coronary     artery is diffusely disease and measures 1.2 mm at the site of the distal     bypass. It is a poor quality target. The saphenous vein is felt to be     fair to good quality conduit.  3. The distal left anterior descending coronary artery is grafted with the     left internal mammary artery unit in an end to side fashion. The coronary     measures 1.5 mm at the site of distal bypass and is a fair quality     target. It is also diffusely diseased but a probe will pass in both     directions a considerable distance. Both proximal saphenous vein     anastomoses are performed directly to the ascending aorta prior to     removal of the aorta cross clamp. The septal temperature is noted to     rise rapidly and dramatically upon reperfusion of the left internal     mammary artery. All air is evacuated from the aortic root. The aortic     cross clamp is removed after a total cross clamp time of 54 minutes.  The heart begins to beat spontaneously without need for cardioversion. All proximal and distal anastomoses are inspected for hemostasis and appropriate graft orientation. Epicardial pacing wires are fixed to the right ventricular out flow  tract into the right atrial appendage. The patient is rewarmed to greater than 37 degrees centigrade temperature. The patient is weaned from cardiopulmonary bypass without difficulty. The patients rhythm at separation from bypass is normal sinus rhythm. No inotropic support is required. Total cardiopulmonary bypass time for the operation is 75 minutes.  The  venous and arterial cannulae are removed uneventfully. Protamine is administered to reverse the anticoagulation. The mediastinum and the left chest are irrigated with saline solution containing vancomycin. Meticulous surgical hemostasis is ascertained. The mediastinum and the left chest are drained with three chest tubes placed through separate stab incisions inferiorly. The deep subcutaneous tissues of the right thigh are drained with a 19 [place] Blake drain. The median sternotomy is closed in routine fashion. The right thigh inc is closed in multiple layers in routine fashion. Skin staples are used to close the right thigh skin incision whereas a subcuticular closure is chosen for the sternal incision.  The patient tolerated the procedure well and is transported to the surgical intensive care unit in stable condition. There are no intraoperative complications. All sponge, needle and instrument counts are verified correct at the completion of the operation. The patient was transfused 1 unit packed red blood cells during cardiopulmonary bypass due to anemia related to dilution and a second unit of packed red blood cells following separation from cardiopulmonary bypass due to continued anemia with ongoing volume requirement. There are no intraoperative complications. Dictated by:   Salvatore Decent Cornelius Moras, M.D. Attending Physician:  Lorain Childes DD:  06/17/01 TD:  06/17/01 Job: [id number]

## 2010-07-28 NOTE — Assessment & Plan Note (Signed)
[facility]                            CARDIOLOGY OFFICE NOTE   Angelica Lawrence, Angelica Lawrence                         MRN:          [id number]  DATE:05/13/2006                            DOB:          [birth year]    IDENTIFICATION:  Angelica Lawrence is a 77 year old woman, history of CAD (CABG in  April 2003) who I last saw in September.   In the interval, she has done pretty well.  She says she is breathing  okay.  She takes activities as tolerated.  Her sugars are under better  control.  Denies chest pain, no significant shortness of breath.  Still  has some lower extremity edema on the right but it is better when she  elevates her leg.   CURRENT MEDICATIONS:  1. Atenolol 25 daily.  2. Humalog as directed.  3. Cholestyramine 4 g daily.  4. Benicar HCT 40/25 daily.  5. Lasix 40 daily.  6. Omeprazole 20 daily.  7. Potassium 20 mEq daily.  8. Aspirin 325 daily.  9. Zocor 80 daily.   PHYSICAL EXAMINATION:  GENERAL:  The patient is in no distress.  She  comes with her walker.  VITAL SIGNS:  Blood pressure 160/80, pulse is 77, weight 231.  LUNGS:  Clear.  NECK:  No bruits.  CARDIAC:  Regular rate and rhythm.  S1, S2.  No S3.  ABDOMEN:  Benign, obese.  EXTREMITIES:  Status post amputation on the left, 1+ edema on the right.   IMPRESSION:  1. Coronary artery disease, clinically stable.  Would follow.      Continue medications.  2. Dyslipidemia.  Excellent control with an LDL of 53, HDL of 52 in      January.  3. Edema, most likely venous stasis.  Encourage her to elevate her leg      and wear support hose.  Again, normal LV function (echocardiogram      difficult but I reviewed).  4. Hypertension.  This is the only value that has been high and I      would follow; I am not going to change her regimen for now.  She      will follow up in primary care.     Pricilla Riffle, MD, [facility]  Electronically Signed   PVR/MedQ  DD: 05/13/2006  DT: 05/13/2006  Job #:  [id number]

## 2010-07-28 NOTE — Consult Note (Signed)
[place]. [facility]  Patient:    Angelica Lawrence, Angelica Lawrence Visit Number: [id number] MRN: [id number]          Service Type: MED Location: 2300 2302 01 Attending Physician:  Tressie Stalker Dictated by:   Charlcie Cradle. Delford Field, M.D. [facility] Proc. Date: 06/23/01 Admit Date:  06/01/2001   CC:         Ramon Dredge B. Tyrone Sage, M.D.  [name], M.D. [facility]  Veneda Melter, M.D. [facility]. Cornelius Moras, M.D.   Consultation Report  CHIEF COMPLAINT:  Respiratory distress, status post bypass surgery.  HISTORY OF PRESENT ILLNESS:  This is a 77 year old African-American female with known coronary artery disease admitted with one week history of progressive dyspnea, now with respiratory distress, admitted on June 01, 2001 to the cardiology service.  She was found to have congestive failure.  A subsequent workup and evaluation revealed severe coronary artery disease with need for bypass surgery, previous history of known significant hypokinesis with ejection fraction in the 55% range previously.  History of hypertension, history of diabetes with nephropathy, neuropathy, and retinopathy.  History of peripheral vascular disease with previous right femoropopliteal and left BKA 15 years ago.  History of lower GI bleed in September 2002.  History of hyperlipidemia.  The patient was admitted on March 23.  She underwent cardiac catheterization after being diuresed.  Also found to have lower GI source of bleeding secondary to ticks.  The patient was given transfusions, stabilized from a medical standpoint, and then on June 17, 2001, underwent bypass surgery. Postoperatively the patient initially did well.  It is important to note that she had a left internal mammary artery placed at her left anterior descending artery and a saphenous graft to the obtuse marginal 1 and posterior descending artery.  Postoperatively the patient has had problems with atelectasis and pleural effusions.  She has  had low grade fevers.  She was placed on empiric Tequin.  The patient was transferred to the floor on June 20, 2001, but noted on x-ray today to have a right 30% pneumothorax with progressive increasing shortness of breath and unable to get increased deep breaths.  The patient notes also increased wheezing.  The patient is referred to [place] for further pulmonary evaluation.  PAST MEDICAL HISTORY:  As noted above.  FAMILY HISTORY:  History of coronary artery disease.  OPERATIVE HISTORY: 1. Bilateral femoropopliteal bypass more than 20 years ago. 2. Ultimately underwent left below knee amputation after failed femoral    popliteal bypass on that side. 3. Right fifth toe amputation, diabetes nephropathy, retinopathy. 4. History of appendectomy. 5. Cholecystectomy. 6. Hysterectomy.  All other past history is noted above in history of present illness.  SOCIAL HISTORY:  The patient lives with her husband, has a large family, very supportive.  She is able to ambulate with a prosthesis.  MEDICATIONS:  1. Zocor 20 mg daily.  2. Protonix 40 mg daily.  3. Aspirin 325 mg daily.  4. Potassium 20 mEq daily.  5. Lopressor 50 mg b.i.d.  6. Lasix 40 mg daily.  7. Altace 2.5 mg daily.  8. Lispro Humalog 19 units daily a.c.  9. Tequin 400 mg daily. 10. Sliding scale insulin.  PHYSICAL EXAMINATION:  VITAL SIGNS:  Temperature is 98, blood pressure 140/60, saturation 99% on 2 L, 95% on room air.  Respirations 20.  GENERAL:  This is an overweight African-American female in no acute distress.  CHEST:  Showed distant breath sounds, decreased breath sounds right upper  lobe area versus left lung zone.  CARDIAC:  Showed a regular rate and rhythm without S3, normal S1, S2.  ABDOMEN:  Soft, nontender.  Bowel sounds active.  EXTREMITIES:  Showed no edema or clubbing or venous disease.  NEUROLOGIC:  Intact.  HEENT:  Showed no jugular venous distention with adenopathy.  Oropharynx clear.  NECK:   Supple.  LABORATORY DATA:  Chest x-ray is reviewed and shows a 30% right pneumothorax which is new compared to previous films.  There was a pneumothorax seen on June 22, 2001, and this is a pneumothorax now present that is increased on June 23, 2001.  There are small bilateral pleural effusions and lower lobe atelectasis noted.  Hemoglobin is 9.7, white count 10.1.  Sodium 138, potassium 3.9, chloride 105, CO2 25, BUN 14, creatinine 1.1, blood sugar 80.  Liver function tests unremarkable.  IMPRESSION: 1. Right upper lobe pneumothorax, about 30%, likely contributing to the    patients significant dyspnea. 2. Atherosclerotic heart disease and peripheral vascular disease with previous    bypass surgery on June 17, 2001. 3. Postoperative atelectasis and pleural effusions. 4. Poor left ventricular function noted preoperatively. 5. Nonsmoker. 6. Postoperative respiratory distress secondary to above.  RECOMMENDATIONS: 1. Agree with ICU transfer. 2. Consider right chest tube placement to re-expand right lung.  I believe    this would help the patients respiratory status considerably. 3. Diurese patient further. 4. Give neb treatments, oxygen supplementation.  Follow up blood gas and    chest x-ray.  No steroids at this time.  Doubt the patient has deep venous    thrombosis or pulmonary embolism at this time. Dictated by:   Charlcie Cradle Delford Field, M.D. [facility] Attending Physician:  Tressie Stalker DD:  06/23/01 TD:  06/23/01 Job: [id number]

## 2010-07-28 NOTE — Consult Note (Signed)
[place]. [facility]  Patient:    Angelica Lawrence, Angelica Lawrence Visit Number: [id number] MRN: [id number]          Service Type: MED Location: 2000 2021 01 Attending Physician:  Tressie Stalker Dictated by:   Valetta Mole. Swords, M.D. [facility] Admit Date:  06/01/2001   CC:         Ramon Dredge B. Tyrone Sage, M.D.  [name], M.D. [facility]   Consultation Report  REQUESTING PHYSICIAN:  Gwenith Daily. Tyrone Sage, M.D.  REASON FOR CONSULTATION:  Hyperglycemia.  HISTORY OF PRESENT ILLNESS:  Ms. Platte is a 77 year old female who has been hospitalized since late March.  She had a very prolonged hospitalization with coronary artery disease, status post bypass surgery, GI bleed, and pneumothorax.  She has been improving gradually.  It has been noted over the past several days that her blood sugar have been elevated occasionally up to the 300 range.  Review of the records demonstrate intermittent hyperglycemia during the hospitalization, but not for an extended length of time.  The patient denies polyuria and polydipsia.  She has not had any visual changes. She has a long history of diabetes and was using Humalog prior to hospitalization.  She has multiple complications from diabetes, including nephropathy and neuropathy.  PAST MEDICAL HISTORY:  Extensive.  Significant for: 1. Coronary artery disease, status post myocardial infarction. 2. Hypertension. 3. Diabetes with nephropathy, neuropathy, and retinopathy.  Her current    insulin regimen is 19 units with breakfast and lunch and 17 units in the    evening. 4. History of hyperlipidemia. 5. History of lower GI bleed. 6. History of peripheral vascular disease, status post a left BKA and a right    fem-pop.  ALLERGIES:  No known drug allergies.  SOCIAL HISTORY:  She is a current nonsmoker.  She does not drink alcohol.  REVIEW OF SYSTEMS:  She denies any chest pain, shortness of breath, PND, orthopnea, or any other complaints in the review  of systems.  On review of the chart has been noted that she has had intermittent fevers.  The patient is unaware of the fevers when she has them and specifically denies abdominal pain, cough, shortness of breath, wheeze, rashes, or dysuria.  CURRENT MEDICATIONS:  1. Zocor 20 mg p.o. q.d.  2. Protonix 40 mg p.o. q.d.  3. Insulin 19 units a.m. and lunch and 17 units in the evening.  4. She takes aspirin 325 mg p.o. q.d.  5. Albuterol nebulizer.  6. Atrovent nebulizer.  7. Altace 2.5 mg p.o. q.12h.  8. Colace 200 mg p.o. q.d.  9. Lasix 40 mg p.o. q.12h. 10. Coreg 5.25 mg p.o. b.i.d. 11. Ultram p.r.n.  PHYSICAL EXAMINATION:  Temperature 99.8 degrees, blood pressure 95/50, respirations 20, pulse 85.  GENERAL APPEARANCE:  She appears as a well-developed, well-nourished, African-American female in no acute distress.  HEENT:  Normocephalic and atraumatic.  Extraocular muscles are intact.  NECK:  Supple without lymphadenopathy, thyromegaly, jugular venous distention, or carotid bruits.  CHEST:  Clear to auscultation, except for slight decreased breath sounds at the right base without dullness to percussion.  CARDIAC:  S1 and S2 are regular.  ABDOMEN:  Obese.  Active bowel sounds.  Soft.  EXTREMITIES:  She has a left BKA and surgical scars in the right lower extremity with 1+ edema to the mid calf.  NEUROLOGIC:  She is alert and oriented.  LABORATORY DATA:  CBGs today have been 248 and 331.  The BMET today demonstrates a  sodium of 133 and a glucose of 207, otherwise normal.  The CBC demonstrates a white count of 8.4, hemoglobin 9.0, and platelet count 310,000.  ASSESSMENT AND PLAN: 1. Diabetes with viable control.  The blood sugar seems to be elevating.    Cause of worsening hyperglycemia unknown.  Contributing factors may be    sedentary in the hospital or febrile syndrome (unknown etiology).  Will    continue current dose of Humalog and will add a sliding scale of  insulin. 2. Fever.  Will monitor for now.  Currently on antibiotics.  Cultures have    been negative to date (last done on June 24, 2001). Dictated by:   Valetta Mole Swords, M.D. [facility] Attending Physician:  Tressie Stalker DD:  06/29/01 TD:  06/29/01 Job: [id number]

## 2010-07-28 NOTE — Procedures (Signed)
[place]. [facility]  Patient:    Angelica Lawrence, Angelica Lawrence Visit Number: [id number] MRN: [id number]          Service Type: MED Location: [id number] Attending Physician:  Dolores Patty Dictated by:   Hedwig Morton. Juanda Chance, M.D. [facility] Proc. Date: 02/05/01 Admit Date:  02/02/2001   CC:         Titus Dubin. Alwyn Ren, M.D. [facility]   Procedure Report  PROCEDURE: Upper endoscopy.  ENDOSCOPIST: Hedwig Morton. Juanda Chance, M.D.  INDICATIONS FOR PROCEDURE: This 77 year old African-American female has had a long hospitalization for congestive heart failure and for GI blood loss.  She recently had a lower GI bleed secondary to post polypectomy.  She has remained Hemoccult positive, though her bowel movements have been now regular and she has been able to eat.  Because of unclear etiology of persistently Hemoccult positive stool she is undergoing upper endoscopy.  ENDOSCOPE: Fujinon single-channel video endoscope.  SEDATION: Versed 5 mg IV, Demerol 50 mg IV.  FINDINGS: The Fujinon single-channel video endoscope was passed under the chin and through the posterior pharynx into the esophagus.  The patient was monitored by pulse oximetry and oxygen saturations were normal.  The proximal and distal esophageal mucosa were unremarkable.  There was no esophagitis, no esophageal varices, no hiatal hernia.  Stomach - The stomach was insufflated with air and showed only one superficial circular erosion in the gastric antrum.  Otherwise the stomach was normal. Retroflexion of the endoscope revealed normal fundus and cardia.  The pyloric outlet was normal.  Duodenum - The duodenal bulb and descending duodenum were unremarkable.  IMPRESSION: Essentially normal upper endoscopy of esophagus, stomach, and duodenum.  PLAN: There is nothing on upper endoscopy to account for persistent Hemoccult positive stool.  The patient was re-examined again after endoscopy and her stool was again trace Hemoccult  positive.  We will proceed with small bowel follow-through.Dictated by:   Hedwig Morton. Juanda Chance, M.D. [facility] Attending Physician:  Dolores Patty DD:  02/05/01 TD:  02/05/01 Job: [id number]

## 2010-07-28 NOTE — H&P (Signed)
[place]. [facility]  Patient:    Angelica Lawrence, Angelica Lawrence Visit Number: [id number] MRN: [id number]          Service Type: Attending:  [name], M.D. [facility] Dictated by:   [name], M.D. [facility] Adm. Date:  11/27/00                           History and Physical  DATE OF BIRTH:  March 19, 1926  REASON FOR ADMISSION:  Chest tightness.  HISTORY OF PRESENT ILLNESS:  The patient is a 77 year old woman with two days of severe tightness localized to the mid chest.  Context exertional. Associated symptoms are shortness of breath and palpitations.  The patient had a Cardiolite last year which showed a very small perfusion defect, and the patient was seen by cardiology, and medical therapy was recommended.  The patient was recently hospitalized for GI bleeding, and she is now feeling much better from that perspective.  PAST MEDICAL HISTORY:  1. Type 2 diabetes, well controlled.  2. Diabetic peripheral neuropathy.  3. Diabetic peripheral retinopathy.  4. Dyslipidemia.  5. Peripheral vascular disease with a history of an amputation of the left     leg.  6. Diabetic nephropathy.  7. Benign hereditary goiter.  8. History of diverticulosis, which may be the cause of the GI bleeding.  9. Hemorrhoids. 10. History of cerebrovascular accident several years ago. 11. Hypertension. 12. History of diffuse atherosclerosis. 13. History of hyperplastic colonic polyp. 14. H. pylori treated in December 1998. 15. Carpal tunnel syndrome.  PAST SURGICAL HISTORY: 1. Left below-the-knee amputation in 1980. 2. Bilateral cataracts in 1996. 3. Total abdominal hysterectomy and bilateral salpingo-oophorectomy in 1997. 4. Appendectomy, cholecystectomy, and corneal transplant, all in the remote    past.  MEDICATIONS: 1. Altace 10 mg b.i.d. 2. Aspirin one q.d. 3. Tiazac 360 mg q.d. 4. Atacand 8 mg q.d. 5. Actos 30 mg q.d. 6. Lasix 40 mg q.d. 7. Questran 4 g t.i.d.  (q.a.c.). 8. Humalog q.a.c. 20-20-10.  SOCIAL HISTORY:  The patient is married.  She is retired.  Nonsmoker, nondrinker.  FAMILY HISTORY:  No heart disease.  REVIEW OF SYSTEMS:  Denies the following:  Fever, weight gain, weight loss, headache, syncope, excessive diaphoresis, nausea, vomiting, bright red blood per rectum, dysuria, incontinence, decrease in urinary force, hematuria, and skin rash.  PHYSICAL EXAMINATION:  VITAL SIGNS:  Blood pressure 120/70, heart rate 90, temperature 97 degrees.  GENERAL:  In wheelchair, no acute distress.  SKIN:  No diaphoretic nodes.  There is no palpable lymphadenopathy.  HEENT:  Head is atraumatic.  Sclerae nonicteric.  Pharynx clear.  NECK:  Supple.  CHEST:  Clear to auscultation.  CARDIOVASCULAR:  No JVD, 1+ right leg edema.  Regular rate and rhythm, no murmur.  ABDOMEN:  Soft, obese, nontender, no hepatosplenomegaly, no mass.  BREASTS/GYNECOLOGIC/RECTAL:  Not done at this time due to patients condition.  EXTREMITIES:  Left leg below-the-knee amputation, right leg no ulcer, normal color and temperature on the foot.  Dorsalis pedis pulse intact.  NEUROLOGIC:  Alert and oriented.  Moves all fours.  Cranial nerves II-XII grossly intact.  Sensation is intact to touch of the right foot.  IMPRESSION: 1. Chest tightness, possibly due to angina. 2. Other chronic medical problems as noted above.  PLAN: 1. Admit to telemetry. 2. Consult cardiology. 3. Change Tiazac to Lopressor. 4. Change Questran to Lipitor if hepatic transaminases permit. Dictated by:   Gregary Signs  Darrelyn Hillock, M.D. [facility] Attending:  [name], M.D. [facility] DD:  11/27/00 TD:  11/27/00 Job: [id number]

## 2010-07-28 NOTE — Cardiovascular Report (Signed)
[place]. [facility]  Patient:    Angelica Lawrence, Angelica Lawrence Visit Number: [id number] MRN: [id number]          Service Type: MED Location: [id number] Attending Physician:  Angelica Lawrence Dictated by:   Angelica Lawrence, M.D. Proc. Date: 11/27/00 Admit Date:  11/27/2000   CC:         Angelica Lawrence, M.D. [facility] Angelica Lawrence, M.D. [facility]  Angelica Lawrence. Angelica Lawrence, M.D. [facility]   Cardiac Catheterization  PROCEDURE: 1. Left heart catheterization 2. Left ventriculogram. 3. Selective coronary angiography. 4. Abdominal aortogram. 5. Percutaneous transluminal coronary angioplasty and stenting of the    proximal left circumflex artery.  DIAGNOSES: 1. Two-vessel coronary artery disease. 2. Normal left ventricular systolic function. 3. Acute posterolateral wall myocardial infarction.  CARDIOLOGIST:  Angelica Lawrence, M.D.  HISTORY:  Angelica Lawrence is a 77 year old black female with history of peripheral vascular disease and diabetes mellitus who presents with acute onset of substernal chest discomfort with shortness of breath.  The patient presented to the emergency room, had nonspecific ECG changes but had persistence of pain.  Initial cardiac enzymes were elevated, and she was brought to the catheterization lab urgently.  TECHNIQUE:  After informed consent was obtained, the patient was brought to the catheterization lab.  A 7-French sheath was placed in the right femoral artery.  Left heart catheterization and selective coronary angiography was then performed in the usual fashion using preformed 6-French Judkins catheters.  INITIAL FINDINGS: 1. Left main trunk: A medium caliber vessel with mild diffuse disease of    30%. 2. LAD.  This is a medium caliber vessel that provides three diagonal    branches.  The LAD has moderate diffuse disease of 30 to 40% in the    proximal and mid section.  The second diagonal branch is the largest of    three vessels and has mild diffuse  disease of 30%. 3. Left circumflex artery: This is a medium caliber vessel that provides    two marginal branches in the distal segment.  The proximal A-V circumflex    has a high-grade narrowing of at least 95% with mild haziness, and the    segment appears calcified and moderately tortuous.  The distal A-V    circumflex between the two marginal branches has moderate diffuse disease    of 30%.  The marginal branches have mild disease of 30 to 40%. 4. Right coronary artery is dominant.  This is a small caliber vessel that    provides a posterior descending artery and two posterior ventricular    branch in its terminal segment.   There is a focal narrowing of 70% in the    mid section after the proximal bend.  There is then diffuse disease of 50    to 60% in the mid section of the vessel.  The distal branch vessels have    mild diffuse disease of 30 to 40%.  LEFT VENTRICULOGRAPHY:  Normal end systolic and end diastolic dimensions. Overall left ventricular function well preserved, ejection fraction approximately 55%.  There is no mitral regurgitation.   Mild hypokinesis of the basal inferior and posterior wall is noted.  LV pressure 110/30, aortic 110/75.  LV EDP equals 14.  INTERVENTION:  With these findings, we elected to proceed with percutaneous intervention of the left circumflex artery.  The patient was given heparin and Integrilin on a weight-adjusted basis plus 300 mg of Plavix orally.  A 7-French Voda Q3.5  guide catheter was used to engage the left coronary artery, and a 0.014 inch Patriot wire was advanced down the distal segment of the vessel.  A 2.5 x 9 mm Maverick balloon was introduced and two inflations performed at 6 and 10 atmospheres for 30 seconds in the proximal segment of the vessel with repeat angiography showing significant improvement in vessel lumen.  There was moderate residual disease of 30%.  A 3.0 x 8 mm Express-2 stent was introduced; however, this proved  difficult to advance through the tortuous proximal A-V circumflex.  Initially a Luge wire was used as a buddy wire; however, this was unsuccessful.  Finally a mailman wire was advanced and the stent advanced over this mailman wire.  This was position in the proximal A-V circumflex and deployed at 14 atmospheres for 60 seconds.  Repeat angiography was then performed after the administration of intracoronary nitroglycerin showing excellent result with full coverage of the lesion, no evidence of vessel damage, and TIMI-3 flow through the left circumflex system. The guide catheter was then removed and sheath secured in position.  The patient tolerated the procedure well and was pain free at the end of the case.  FINAL RESULTS:  Successful PTCA and stenting of the proximal A-V circumflex with reduction of 99.5% narrowing to 0% with placement of 3.0 x 8 mm Express stent. Dictated by:   Angelica Lawrence, M.D. Attending Physician:  Angelica Lawrence DD:  11/27/00 TD:  11/27/00 Job: [id number]

## 2010-07-28 NOTE — Discharge Summary (Signed)
[place]. [facility]  Patient:    Angelica Lawrence, Angelica Lawrence Visit Number: [id number] MRN: [id number]          Service Type: SUR Location: 2000 2038 01 Attending Physician:  Tressie Stalker Dictated by:   Adair Patter, P.A. Admit Date:  08/22/2001 Disc. Date: 09/03/01                             Discharge Summary  DATE OF BIRTH:  10-14-1926.  ADMISSION DIAGNOSES: Infected right thigh wound.  SECONDARY DIAGNOSES: 1. Insulin-dependent diabetes mellitus. 2. Hypertension. 3. Coronary artery disease.  DISCHARGE DIAGNOSIS:  Right thigh wound infection.  HOSPITAL COURSE:  The patient was admitted to [facility] on August 22, 2001 secondary to infected right thigh wound.  Because of this on hospital day #2 Dr. Cornelius Moras performed an incision and drainage of this right thigh infection. Several days later the patient had ______  placed.  The patient received a course of p.o. Cipro and course of intravenous vancomycin.  The patient had continued continued ______ throughout the remainder of her hospital course. Her wound began granulating well and she was subsequently deemed stable for discharge on September 03, 2001.  DISCHARGE MEDICATIONS: 1. Ultram 50 mg one to two tablets q.4-6h. as needed for pain. 2. Aspirin 325 mg q.d. 3. Lasix 40 mg one q.d. 4. Zocor 40 mg one q.d. 5. Altace 10 mg one q.d. 6. Lantus 40 units every evening. 7. NovoLog insulin, 8 units in the morning, 10 units in the afternoon,    10 units in the evening. 8. Potassium chloride 20 mEq one daily. 9. Coreg 3.125 mg one twice daily.  ACTIVITY: Patient was told no driving, strenuous activity or lifting any heavy objects.  DIET:  1800 calorie ADA diet.  WOUND CARE:  Visiting nurse will provide wound care and ______ treatments.  DISPOSITION: Home.  FOLLOW UP: Patient will see Dr. Cornelius Moras in one to two weeks.  The CVTS office will let the patient know the date and time of this appointment. Dictated  by:   Adair Patter, P.A. Attending Physician:  Tressie Stalker DD:  09/02/01 TD:  09/03/01 Job: [id number]

## 2010-07-28 NOTE — Op Note (Signed)
[place]. [facility]  Patient:    Angelica Lawrence, Angelica Lawrence Visit Number: [id number] MRN: [id number]          Service Type: SUR Location: 2000 2038 01 Attending Physician:  Tressie Stalker Dictated by:   Salvatore Decent. Cornelius Moras, M.D. Proc. Date: 08/23/01 Admit Date:  08/22/2001                             Operative Report  PREOPERATIVE DIAGNOSIS:  Right thigh abscess.  POSTOPERATIVE DIAGNOSIS:  Right thigh abscess.  OPERATION PERFORMED:  Incisional debridement and irrigation of right thigh abscess.  SURGEON:  Salvatore Decent. Cornelius Moras, M.D.  ANESTHESIA:  1% lidocaine local with intravenous sedation and monitored anesthesia care.  INDICATIONS FOR PROCEDURE:  The patient is a 77 year old obese African-American female with diabetes, hypertension, coronary artery disease and numerous other medical problems.  The patient underwent coronary artery bypass grafting grafting in early April 2003.  She initially did fairly well and has been home for several weeks.  She now returns with fevers, malaise and a very tender and painful right thigh with palpable fluctuant fluid collection in the subcutaneous tissues associated with the right thigh saphenous vein harvest incision.  The patient has been counseled at length regarding the indications and potential benefits of surgical incision and drainage.  All of her questions have been addressed.  She accepts all associated risks of surgery and desires to proceed as described.  DESCRIPTION OF PROCEDURE:  The patient was brought to the operating room on the above-mentioned date and placed in supine position on the operating table. Intravenous sedation was administered by the anesthesia service under the care and direction of Dr. Diamantina Monks.  The patients right thigh was prepared and draped in a sterile manner.  1% lidocaine local anesthesia was administered in the skin and subcutaneous tissues immediately surrounding the previous surgical scar  from her saphenous vein harvest incision, an 18 gauge needle was then used to aspirate and a large amount of murky fluid was easily aspirated. Some of this fluid was sent for culture and sensitivity.  A 10 blade knife was used to make a longitudinal incision directly through the old scar and the abscess cavity was entered.  A large amount of purulent and cloudy murky fluid was aspirated.  The incision was completed in both directions until the entire abscess cavity had been opened completely. There are no significant loculations.  The abscess cavity was irrigated using a pulse irrigation device with 3L of normal saline.  The abscess cavity was then irrigated with saline solution containing multiple antibiotics.  The wound was packed using sterile cotton gauze moistened with antibiotic containing solution.  A dry sterile dressing was applied.  The patient tolerated the procedure well and was transported to the recovery room in stable condition.  There were no intraoperative complications.  Sponge, needle and instrument counts were correct at completion of the operation. Dictated by:   Salvatore Decent Cornelius Moras, M.D. Attending Physician:  Tressie Stalker DD:  08/23/01 TD:  08/25/01 Job: [id number]

## 2010-08-04 ENCOUNTER — Other Ambulatory Visit: Payer: Self-pay | Admitting: Internal Medicine

## 2010-08-04 DIAGNOSIS — I6529 Occlusion and stenosis of unspecified carotid artery: Secondary | ICD-10-CM

## 2010-08-05 ENCOUNTER — Other Ambulatory Visit: Payer: Self-pay | Admitting: Endocrinology

## 2010-08-08 ENCOUNTER — Other Ambulatory Visit: Payer: Self-pay | Admitting: *Deleted

## 2010-08-08 ENCOUNTER — Encounter (INDEPENDENT_AMBULATORY_CARE_PROVIDER_SITE_OTHER): Payer: Medicare Other | Admitting: *Deleted

## 2010-08-08 DIAGNOSIS — I6529 Occlusion and stenosis of unspecified carotid artery: Secondary | ICD-10-CM

## 2010-08-11 ENCOUNTER — Encounter: Payer: Self-pay | Admitting: Internal Medicine

## 2010-08-23 ENCOUNTER — Other Ambulatory Visit: Payer: Self-pay | Admitting: Endocrinology

## 2010-09-24 ENCOUNTER — Other Ambulatory Visit: Payer: Self-pay | Admitting: Endocrinology

## 2010-10-11 ENCOUNTER — Encounter: Payer: Self-pay | Admitting: *Deleted

## 2010-10-13 ENCOUNTER — Other Ambulatory Visit (INDEPENDENT_AMBULATORY_CARE_PROVIDER_SITE_OTHER): Payer: Medicare Other

## 2010-10-13 ENCOUNTER — Encounter: Payer: Self-pay | Admitting: Endocrinology

## 2010-10-13 ENCOUNTER — Ambulatory Visit (INDEPENDENT_AMBULATORY_CARE_PROVIDER_SITE_OTHER): Payer: Medicare Other | Admitting: Endocrinology

## 2010-10-13 ENCOUNTER — Other Ambulatory Visit: Payer: Self-pay | Admitting: Endocrinology

## 2010-10-13 DIAGNOSIS — D509 Iron deficiency anemia, unspecified: Secondary | ICD-10-CM

## 2010-10-13 DIAGNOSIS — Z0000 Encounter for general adult medical examination without abnormal findings: Secondary | ICD-10-CM

## 2010-10-13 DIAGNOSIS — G609 Hereditary and idiopathic neuropathy, unspecified: Secondary | ICD-10-CM

## 2010-10-13 DIAGNOSIS — E109 Type 1 diabetes mellitus without complications: Secondary | ICD-10-CM

## 2010-10-13 DIAGNOSIS — I2510 Atherosclerotic heart disease of native coronary artery without angina pectoris: Secondary | ICD-10-CM

## 2010-10-13 DIAGNOSIS — I10 Essential (primary) hypertension: Secondary | ICD-10-CM

## 2010-10-13 DIAGNOSIS — K769 Liver disease, unspecified: Secondary | ICD-10-CM

## 2010-10-13 DIAGNOSIS — E785 Hyperlipidemia, unspecified: Secondary | ICD-10-CM

## 2010-10-13 LAB — CBC WITH DIFFERENTIAL/PLATELET
Basophils Absolute: 0 10*3/uL (ref 0.0–0.1)
Basophils Relative: 0.4 % (ref 0.0–3.0)
Eosinophils Absolute: 0.1 10*3/uL (ref 0.0–0.7)
Lymphocytes Relative: 21.7 % (ref 12.0–46.0)
MCHC: 33.3 g/dL (ref 30.0–36.0)
Neutrophils Relative %: 69 % (ref 43.0–77.0)
Platelets: 329 10*3/uL (ref 150.0–400.0)
RBC: 3.54 Mil/uL — ABNORMAL LOW (ref 3.87–5.11)
RDW: 13.6 % (ref 11.5–14.6)

## 2010-10-13 LAB — BASIC METABOLIC PANEL
CO2: 24 mEq/L (ref 19–32)
Glucose, Bld: 130 mg/dL — ABNORMAL HIGH (ref 70–99)
Potassium: 4.1 mEq/L (ref 3.5–5.1)
Sodium: 141 mEq/L (ref 135–145)

## 2010-10-13 LAB — LIPID PANEL
Cholesterol: 106 mg/dL (ref 0–200)
HDL: 41.5 mg/dL (ref >39.00)
Triglycerides: 76 mg/dL (ref 0.0–149.0)

## 2010-10-13 LAB — HEPATIC FUNCTION PANEL
ALT: 16 U/L (ref 0–35)
AST: 20 U/L (ref 0–37)
Alkaline Phosphatase: 73 U/L (ref 39–117)
Bilirubin, Direct: 0.2 mg/dL (ref 0.0–0.3)
Total Protein: 6.9 g/dL (ref 6.0–8.3)

## 2010-10-13 LAB — TSH: TSH: 1.71 u[IU]/mL (ref 0.35–5.50)

## 2010-10-13 NOTE — Patient Instructions (Addendum)
i have requested an appointment for you to go back to see dr Tenny Craw.  you will receive a phone call, with a day and time for an appointment. blood tests are being ordered for you today.  please call [phone] to hear your test results.  You will be prompted to enter the 9-digit "MRN" number that appears at the top left of this page, followed by #.  Then you will hear the message. please consider these measures for your health:  minimize alcohol.  do not use tobacco products.  have a colonoscopy at least every 10 years from age 64.  Women should have an annual mammogram from age 74.  keep firearms safely stored.  always use seat belts.  have working smoke alarms in your home.  see an eye doctor and dentist regularly.  never drive under the influence of alcohol or drugs (including prescription drugs).   please let me know what your wishes would be, if artificial life support measures should become necessary.  it is critically important to prevent falling down (keep floor areas well-lit, dry, and free of loose objects). Please make a follow-up appointment in 6 months.   You should have a vaccine against shingles (a painful rash which results from the  chickenpox infection which most people had many years ago).  This vaccine reduces, but does not totally eliminate the risk of shingles.  Because this is a medicare part d benefit, there are 3 ways you can get it:  You can go to a pharmacy and get the injection (I can give you a prescription), or I can give you a prescription to have filled at a pharmacy, and bring back here for [place] to give.  The other option is that you can pay up-front for it, and we'll give you a form to make a claim for reimbursement from your medicare part d carrier. (update: i left message on phone-tree:  take fe 2/d.  Reduce insulin to 15 units tid (just before each meal)

## 2010-10-13 NOTE — Progress Notes (Signed)
Subjective:    Patient ID: Angelica Lawrence, female    DOB: [birth year], 77 y.o.   MRN: [id number]  HPI here for regular wellness examination.  He's feeling pretty well in general, and says chronic med probs are stable, except as noted below.  She wants to have whatever exam she can do seated in he wheelchair. Past Medical History  Diagnosis Date  . ALLERGIC RHINITIS 12/30/2006  . ANEMIA, IRON DEFICIENCY 10/21/2009  . COLONIC POLYPS, HX OF 12/30/2006  . CORONARY ARTERY DISEASE 12/30/2006  . DIABETES MELLITUS, TYPE I 12/30/2006  . DIVERTICULOSIS, COLON 12/30/2006  . HYPERLIPIDEMIA 12/30/2006  . HYPERTENSION 12/30/2006  . PERIPHERAL NEUROPATHY 12/30/2006  . PERIPHERAL VASCULAR DISEASE 12/30/2006  . GOITER 12/30/2006  . VITAMIN B12 DEFICIENCY 09/16/2008  . Carpal tunnel syndrome 12/30/2006  . CATARACTS, BILATERAL 12/30/2006  . HEMORRHOIDS 12/30/2006  . DEGENERATIVE JOINT DISEASE 02/10/2007  . INSOMNIA 10/21/2009  . CORNEAL TRANSPLANT 12/30/2006  . BKA, LEFT LEG 12/30/2006    Past Surgical History  Procedure Date  . Appendectomy   . Cholecystectomy   . Abdominal hysterectomy 1960's    and bso  . Below knee leg amputation 1989    L    History   Social History  . Marital Status: Widowed    Spouse Name: N/A    Number of Children: N/A  . Years of Education: N/A   Occupational History  . Retired    Social History Main Topics  . Smoking status: Never Smoker   . Smokeless tobacco: Not on file  . Alcohol Use: Not on file  . Drug Use: Not on file  . Sexually Active: Not on file   Other Topics Concern  . Not on file   Social History Narrative  . No narrative on file    Current Outpatient Prescriptions on File Prior to Visit  Medication Sig Dispense Refill  . aspirin 81 MG tablet Take 81 mg by mouth daily.        Marland Kitchen atenolol (TENORMIN) 25 MG tablet TAKE 1 TABLET BY MOUTH EVERY DAY  30 tablet  4  . cholestyramine (QUESTRAN) 4 GM/DOSE powder USE 1 SCOOP ONCE DAILY WITH 2 OZ OF  WATER  378 g  1  . ferrous sulfate ([facility] IRON) 325 (65 FE) MG tablet Take 325 mg by mouth daily.        . furosemide (LASIX) 40 MG tablet Take 1 tablet (40 mg total) by mouth daily.  30 tablet  6  . insulin lispro (HUMALOG) 100 UNIT/ML injection Inject 25 Units into the skin 3 (three) times daily before meals.        . Insulin Pen Needle 31G X 8 MM MISC 1 each by Does not apply route daily.  100 each  1  . isosorbide mononitrate (ISMO,MONOKET) 10 MG tablet TAKE 1 TABLET EVERY DAY  30 tablet  2  . losartan-hydrochlorothiazide (HYZAAR) 100-25 MG per tablet TAKE 1 TABLET EVERY DAY  30 tablet  3  . omeprazole (PRILOSEC) 20 MG capsule TAKE ONE CAPSULE BY MOUTH EVERY MORNING  30 capsule  2  . simvastatin (ZOCOR) 40 MG tablet Take 40 mg by mouth at bedtime.        . traZODone (DESYREL) 100 MG tablet Take 100 mg by mouth at bedtime.        . vitamin B-12 (CYANOCOBALAMIN) 1000 MCG tablet 1000 mcg every month         Allergies not on file  Family History  Problem  Relation Age of Onset  . Cancer Neg Hx   . Heart disease Neg Hx     BP 118/64  Pulse 71  Temp(Src) 98.9 F (37.2 C) (Oral)  Ht 5\' 7"  (1.702 m)  Wt 188 lb 1.9 oz (85.331 kg)  BMI 29.46 kg/m2  SpO2 97%    Review of Systems  Constitutional: Negative for fever.  HENT: Negative for hearing loss.   Eyes: Negative for visual disturbance.  Respiratory: Negative for shortness of breath.   Cardiovascular: Negative for chest pain.  Gastrointestinal: Negative for anal bleeding.  Genitourinary: Negative for hematuria.  Musculoskeletal: Positive for gait problem.  Skin: Negative for rash.  Neurological: Negative for syncope.  Hematological: Bruises/bleeds easily.  Psychiatric/Behavioral: Negative for dysphoric mood.      Objective:   Physical Exam VS: see vs page GEN: no distress.  In wheelchair HEAD: head: no deformity eyes: no periorbital swelling, no proptosis external nose and ears are normal mouth: no lesion seen NECK:  supple, thyroid is not enlarged. CHEST WALL: no deformity BREASTS:  No mass.  No d/c CV: reg rate and rhythm, no murmur ABD: abdomen is soft, nontender.  no hepatosplenomegaly.  not distended.  no hernia MUSCULOSKELETAL: muscle bulk and strength are grossly normal.  no obvious joint swelling.  gait is normal and steady NEURO: cn 2-12 grossly intact.   SKIN:  Normal texture and temperature.  No rash or suspicious lesion is visible.   NODES:  None palpable at the neck PSYCH: alert, oriented x3.  Does not appear anxious nor depressed.          Assessment & Plan:  Wellness visit today, with problems stable, except as noted.    SEPARATE EVALUATION FOLLOWS--EACH PROBLEM HERE IS NEW, NOT RESPONDING TO TREATMENT, OR POSES SIGNIFICANT RISK TO THE PATIENT'S HEALTH: HISTORY OF THE PRESENT ILLNESS: Pt has h/o many years of insulin-requiring dm.   Denies brbpr PAST MEDICAL HISTORY reviewed and up to date today REVIEW OF SYSTEMS: Denies weight change PHYSICAL EXAMINATION: EXTEMITIES: left bka.  no ulcer on the right foot.  Right foot has normal color and temp.  no edema.  There is onychomycosis on the right toenails.  The right 5th toenail is surgically absent.   PULSES: dorsalis pedis intact on the right foot, but decreased from normal.  no carotid bruit NEURO:   readily moves all 4's.  sensation is intact to touch on the feet LAB/XRAY RESULTS: i reviewed a1c, cbc, and fe. IMPRESSION: Dm overcontrolled fe-deficiency anemia, needs increased rx PLAN: See instruction page

## 2010-10-14 LAB — URINALYSIS, ROUTINE W REFLEX MICROSCOPIC
Glucose, UA: NEGATIVE mg/dL
Leukocytes, UA: NEGATIVE
Nitrite: NEGATIVE
Protein, ur: NEGATIVE mg/dL
pH: 5 (ref 5.0–8.0)

## 2010-10-18 ENCOUNTER — Other Ambulatory Visit: Payer: Self-pay | Admitting: *Deleted

## 2010-10-18 MED ORDER — TRAZODONE HCL 100 MG PO TABS: 100.0000 mg | ORAL_TABLET | Freq: Every day | ORAL | Status: DC

## 2010-10-18 NOTE — Telephone Encounter (Signed)
R'cd fax from [facility] for refill of Trazodone.  Last OV-10/13/2010  Last filled-09/19/2010

## 2010-10-23 ENCOUNTER — Other Ambulatory Visit: Payer: Self-pay | Admitting: *Deleted

## 2010-10-23 MED ORDER — SIMVASTATIN 40 MG PO TABS: 40.0000 mg | ORAL_TABLET | Freq: Every day | ORAL | Status: DC

## 2010-10-23 NOTE — Telephone Encounter (Signed)
R'cd fax from [facility] for refill of Simvastatin  Last OV-10/13/2010  Last filled-09/12/2010

## 2010-10-30 ENCOUNTER — Other Ambulatory Visit: Payer: Self-pay | Admitting: *Deleted

## 2010-10-30 ENCOUNTER — Ambulatory Visit: Payer: Medicare Other | Admitting: Internal Medicine

## 2010-10-30 MED ORDER — CHOLESTYRAMINE 4 GM/DOSE PO POWD: ORAL | Status: DC

## 2010-10-30 MED ORDER — OMEPRAZOLE 20 MG PO CPDR: DELAYED_RELEASE_CAPSULE | ORAL | Status: DC

## 2010-10-30 NOTE — Telephone Encounter (Signed)
R'cd fax from [facility] for refill of Cholestyramine Powder  Last OV-10/13/2010  Last filled-09/02/2010

## 2010-10-30 NOTE — Telephone Encounter (Signed)
R'cd fax from [facility] for refill of Omeprazole  Last OV-10/13/2010  Last filled-10/03/2010

## 2010-11-02 ENCOUNTER — Other Ambulatory Visit: Payer: Self-pay | Admitting: Endocrinology

## 2010-11-06 ENCOUNTER — Other Ambulatory Visit: Payer: Self-pay | Admitting: Endocrinology

## 2010-11-08 ENCOUNTER — Other Ambulatory Visit: Payer: Self-pay | Admitting: Endocrinology

## 2010-11-23 ENCOUNTER — Other Ambulatory Visit: Payer: Self-pay | Admitting: Endocrinology

## 2010-12-01 ENCOUNTER — Other Ambulatory Visit: Payer: Self-pay | Admitting: Endocrinology

## 2010-12-07 ENCOUNTER — Ambulatory Visit (INDEPENDENT_AMBULATORY_CARE_PROVIDER_SITE_OTHER): Payer: Medicare Other | Admitting: Internal Medicine

## 2010-12-07 ENCOUNTER — Encounter: Payer: Self-pay | Admitting: Internal Medicine

## 2010-12-07 DIAGNOSIS — I10 Essential (primary) hypertension: Secondary | ICD-10-CM

## 2010-12-07 DIAGNOSIS — E785 Hyperlipidemia, unspecified: Secondary | ICD-10-CM

## 2010-12-07 DIAGNOSIS — I2510 Atherosclerotic heart disease of native coronary artery without angina pectoris: Secondary | ICD-10-CM

## 2010-12-07 NOTE — Assessment & Plan Note (Signed)
No symptoms to suggest active ischemia.  She is now 9 years post CABG.  Continue meds.

## 2010-12-07 NOTE — Progress Notes (Signed)
HPIIDENTIFICATION:  Ms. Ticas is a 77 year old woman with a history of CAD (CABG in April 2003).  I last saw her in March of 2008.  She also has a history of dyslipidemia, lower extremity edema (felt secondary to venous stasis), normal LV function and hypertension.  The patient is also a diabetic. I last saw her in 2009.  Since seen she denies CP.  Breathing is OK, unchanged.  No PND. No palpitatons.  No dizziness.   No Known Allergies  Current Outpatient Prescriptions  Medication Sig Dispense Refill  . aspirin 81 MG tablet Take 81 mg by mouth daily.        Marland Kitchen atenolol (TENORMIN) 25 MG tablet TAKE 1 TABLET BY MOUTH EVERY DAY  30 tablet  5  . cholestyramine (QUESTRAN) 4 GM/DOSE powder USE 1 SCOOP ONCE DAILY WITH 2 OZ OF WATER  378 g  3  . ferrous sulfate ([facility] IRON) 325 (65 FE) MG tablet Take 325 mg by mouth daily.        . furosemide (LASIX) 40 MG tablet Take 1 tablet (40 mg total) by mouth daily.  30 tablet  6  . insulin lispro (HUMALOG KWIKPEN) 100 UNIT/ML injection Inject 15 Units into the skin 3 (three) times daily before meals.  15 mL  2  . Insulin Pen Needle 31G X 8 MM MISC 1 each by Does not apply route daily.  100 each  1  . isosorbide mononitrate (ISMO,MONOKET) 10 MG tablet TAKE 1 TABLET EVERY DAY  30 tablet  5  . losartan-hydrochlorothiazide (HYZAAR) 100-25 MG per tablet TAKE 1 TABLET EVERY DAY  30 tablet  3  . omeprazole (PRILOSEC) 20 MG capsule TAKE ONE CAPSULE BY MOUTH EVERY MORNING  30 capsule  11  . omeprazole (PRILOSEC) 20 MG capsule TAKE ONE CAPSULE BY MOUTH EVERY MORNING  30 capsule  5  . simvastatin (ZOCOR) 40 MG tablet Take 1 tablet (40 mg total) by mouth at bedtime.  30 tablet  11  . traZODone (DESYREL) 100 MG tablet Take 1 tablet (100 mg total) by mouth at bedtime.  30 tablet  5  . vitamin B-12 (CYANOCOBALAMIN) 1000 MCG tablet 1000 mcg every month         Past Medical History  Diagnosis Date  . ALLERGIC RHINITIS 12/30/2006  . ANEMIA, IRON DEFICIENCY 10/21/2009  .  COLONIC POLYPS, HX OF 12/30/2006  . CORONARY ARTERY DISEASE 12/30/2006  . DIABETES MELLITUS, TYPE I 12/30/2006  . DIVERTICULOSIS, COLON 12/30/2006  . HYPERLIPIDEMIA 12/30/2006  . HYPERTENSION 12/30/2006  . PERIPHERAL NEUROPATHY 12/30/2006  . PERIPHERAL VASCULAR DISEASE 12/30/2006  . GOITER 12/30/2006  . VITAMIN B12 DEFICIENCY 09/16/2008  . Carpal tunnel syndrome 12/30/2006  . CATARACTS, BILATERAL 12/30/2006  . HEMORRHOIDS 12/30/2006  . DEGENERATIVE JOINT DISEASE 02/10/2007  . INSOMNIA 10/21/2009  . CORNEAL TRANSPLANT 12/30/2006  . BKA, LEFT LEG 12/30/2006    Past Surgical History  Procedure Date  . Appendectomy   . Cholecystectomy   . Abdominal hysterectomy 1960's    and bso  . Below knee leg amputation 1989    L    Family History  Problem Relation Age of Onset  . Cancer Neg Hx   . Heart disease Neg Hx     History   Social History  . Marital Status: Widowed    Spouse Name: N/A    Number of Children: N/A  . Years of Education: N/A   Occupational History  . Retired    Social History Main Topics  .  Smoking status: Never Smoker   . Smokeless tobacco: Not on file  . Alcohol Use: Not on file  . Drug Use: Not on file  . Sexually Active: Not on file   Other Topics Concern  . Not on file   Social History Narrative  . No narrative on file    Review of Systems:  All systems reviewed.  They are negative to the above problem except as previously stated.  Vital Signs: BP 110/56  Pulse 60  Resp 18  Ht 5\' 8"  (1.727 m)  Wt 184 lb 12.8 oz (83.825 kg)  BMI 28.10 kg/m2  Physical Exam Patient is in NAD in wheel chair.    HEENT:  Normocephalic, atraumatic. EOMI, PERRLA.  Neck: JVP is normal. No thyromegaly. No bruits.  Lungs: clear to auscultation. No rales no wheezes.  Heart: Regular rate and rhythm. Normal S1, S2. No S3.   No significant murmurs. PMI not displaced.  Abdomen:  Supple, nontender. Normal bowel sounds. No masses. No hepatomegaly.  Extremities:   Good  distal pulses throughout. Tr to 1+ R. lower extremity edema. s/p L AKA Musculoskeletal :moving all extremities.  Neuro:   alert and oriented x3.  CN II-XII grossly intact.  10/13/10:  EKG:  Sinus rhytm.  RBBB.  LAteral MI.  Assessment and Plan:

## 2010-12-07 NOTE — Assessment & Plan Note (Signed)
Good control

## 2010-12-07 NOTE — Patient Instructions (Signed)
Your physician wants you to follow-up in:  12 months.  You will receive a reminder letter in the mail two months in advance. If you don't receive a letter, please call our office to schedule the follow-up appointment.   

## 2010-12-07 NOTE — Assessment & Plan Note (Signed)
Good control.  Keep on regimen.

## 2010-12-19 LAB — DIFFERENTIAL
Basophils Relative: 1
Eosinophils Absolute: 0.1
Lymphs Abs: 2.2
Monocytes Absolute: 0.5
Monocytes Relative: 5
Neutro Abs: 6.3
Neutrophils Relative %: 70

## 2010-12-19 LAB — URINALYSIS, ROUTINE W REFLEX MICROSCOPIC
Glucose, UA: NEGATIVE
Nitrite: POSITIVE — AB
Protein, ur: 30 — AB
pH: 5

## 2010-12-19 LAB — CBC
Hemoglobin: 12.8
MCHC: 33.6
MCV: 90.6
RBC: 4.21
WBC: 9.1

## 2010-12-19 LAB — BASIC METABOLIC PANEL
CO2: 23
Chloride: 109
GFR calc Af Amer: 49 — ABNORMAL LOW
Sodium: 138

## 2010-12-19 LAB — URINE CULTURE

## 2010-12-19 LAB — URINE MICROSCOPIC-ADD ON

## 2010-12-23 ENCOUNTER — Other Ambulatory Visit: Payer: Self-pay | Admitting: Endocrinology

## 2011-01-02 ENCOUNTER — Encounter: Payer: Self-pay | Admitting: Endocrinology

## 2011-01-29 ENCOUNTER — Other Ambulatory Visit: Payer: Self-pay | Admitting: Endocrinology

## 2011-03-29 ENCOUNTER — Other Ambulatory Visit: Payer: Self-pay | Admitting: Endocrinology

## 2011-04-19 ENCOUNTER — Other Ambulatory Visit: Payer: Self-pay | Admitting: Endocrinology

## 2011-04-26 ENCOUNTER — Other Ambulatory Visit: Payer: Self-pay | Admitting: Endocrinology

## 2011-06-15 ENCOUNTER — Telehealth: Payer: Self-pay | Admitting: *Deleted

## 2011-06-15 MED ORDER — INSULIN ASPART 100 UNIT/ML ~~LOC~~ SOLN: 15.0000 [IU] | Freq: Three times a day (TID) | SUBCUTANEOUS | Status: DC

## 2011-06-15 NOTE — Telephone Encounter (Signed)
Ok to change to Novolog

## 2011-06-15 NOTE — Telephone Encounter (Signed)
R'cd fax from pharmacy, that Humalog is no longer covered and will require PA. Formulary alternative are Novolog, Novolog Flexpen or [employer]. Please advise if PA is needed or if formulary alternative is appropriate.

## 2011-06-15 NOTE — Telephone Encounter (Signed)
Rx changed to [employer] and sent to [facility], called to inform pt, no answer/unable to leave message.

## 2011-06-18 NOTE — Telephone Encounter (Signed)
Called to inform pt, no answer/unable to leave message.  

## 2011-06-19 NOTE — Telephone Encounter (Signed)
Called pt, no answer/unable to leave message.  

## 2011-06-20 NOTE — Telephone Encounter (Signed)
Called pt, no answer/unable to leave message, closing phone note, will send letter.

## 2011-06-24 ENCOUNTER — Other Ambulatory Visit: Payer: Self-pay | Admitting: Endocrinology

## 2011-07-18 ENCOUNTER — Other Ambulatory Visit: Payer: Self-pay | Admitting: Endocrinology

## 2011-07-23 ENCOUNTER — Encounter: Payer: Self-pay | Admitting: Endocrinology

## 2011-07-23 ENCOUNTER — Other Ambulatory Visit (INDEPENDENT_AMBULATORY_CARE_PROVIDER_SITE_OTHER): Payer: Medicare Other

## 2011-07-23 ENCOUNTER — Ambulatory Visit (INDEPENDENT_AMBULATORY_CARE_PROVIDER_SITE_OTHER): Payer: Medicare Other | Admitting: Endocrinology

## 2011-07-23 VITALS — BP 128/78 | HR 69 | Temp 98.4°F

## 2011-07-23 DIAGNOSIS — N289 Disorder of kidney and ureter, unspecified: Secondary | ICD-10-CM

## 2011-07-23 DIAGNOSIS — N058 Unspecified nephritic syndrome with other morphologic changes: Secondary | ICD-10-CM

## 2011-07-23 DIAGNOSIS — E1029 Type 1 diabetes mellitus with other diabetic kidney complication: Secondary | ICD-10-CM

## 2011-07-23 DIAGNOSIS — Z79899 Other long term (current) drug therapy: Secondary | ICD-10-CM

## 2011-07-23 LAB — BASIC METABOLIC PANEL
BUN: 20 mg/dL (ref 6–23)
CO2: 25 mEq/L (ref 19–32)
GFR: 42.66 mL/min — ABNORMAL LOW (ref >60.00)
Glucose, Bld: 93 mg/dL (ref 70–99)
Potassium: 3.2 mEq/L — ABNORMAL LOW (ref 3.5–5.1)
Sodium: 140 mEq/L (ref 135–145)

## 2011-07-23 MED ORDER — POTASSIUM CHLORIDE ER 10 MEQ PO TBCR: 10.0000 meq | EXTENDED_RELEASE_TABLET | Freq: Every day | ORAL | Status: DC

## 2011-07-23 NOTE — Patient Instructions (Addendum)
blood tests are being requested for you today.  You will receive a letter with results. Please come back for a regular physical appointment in 3 months. check your blood sugar twice a day.  vary the time of day when you check, between before the 3 meals, and at bedtime.  also check if you have symptoms of your blood sugar being too high or too low.  please keep a record of the readings and bring it to your next appointment here.  please call us sooner if your blood sugar goes below 70, or if it stays over 200.  

## 2011-07-23 NOTE — Progress Notes (Signed)
Subjective:    Patient ID: Angelica Lawrence, female    DOB: [birth year], 78 y.o.   MRN: [id number]  HPI The state of at least three ongoing medical problems is addressed today: Pt returns for f/u of IDDM (dx'ed, complicated by peripheral sensory neuropathy, CVA, CAD, background retinopathy, left leg BKA, proteinuria, and PAD).  no cbg record, but states cbg's are well-controlled.   Insomnia: trazodone helps Renal insuff:  Denies polyuria Past Medical History  Diagnosis Date  . ALLERGIC RHINITIS 12/30/2006  . ANEMIA, IRON DEFICIENCY 10/21/2009  . COLONIC POLYPS, HX OF 12/30/2006  . CORONARY ARTERY DISEASE 12/30/2006  . DIABETES MELLITUS, TYPE I 12/30/2006  . DIVERTICULOSIS, COLON 12/30/2006  . HYPERLIPIDEMIA 12/30/2006  . HYPERTENSION 12/30/2006  . PERIPHERAL NEUROPATHY 12/30/2006  . PERIPHERAL VASCULAR DISEASE 12/30/2006  . GOITER 12/30/2006  . VITAMIN B12 DEFICIENCY 09/16/2008  . Carpal tunnel syndrome 12/30/2006  . CATARACTS, BILATERAL 12/30/2006  . HEMORRHOIDS 12/30/2006  . DEGENERATIVE JOINT DISEASE 02/10/2007  . INSOMNIA 10/21/2009  . CORNEAL TRANSPLANT 12/30/2006  . BKA, LEFT LEG 12/30/2006    Past Surgical History  Procedure Date  . Appendectomy   . Cholecystectomy   . Abdominal hysterectomy 1960's    and bso  . Below knee leg amputation 1989    L    History   Social History  . Marital Status: Widowed    Spouse Name: N/A    Number of Children: N/A  . Years of Education: N/A   Occupational History  . Retired    Social History Main Topics  . Smoking status: Never Smoker   . Smokeless tobacco: Not on file  . Alcohol Use: Not on file  . Drug Use: Not on file  . Sexually Active: Not on file   Other Topics Concern  . Not on file   Social History Narrative  . No narrative on file    Current Outpatient Prescriptions on File Prior to Visit  Medication Sig Dispense Refill  . aspirin 81 MG tablet Take 81 mg by mouth daily.        Marland Kitchen atenolol (TENORMIN) 25 MG  tablet TAKE 1 TABLET BY MOUTH EVERY DAY  30 tablet  5  . cholestyramine (QUESTRAN) 4 GM/DOSE powder USE 1 SCOOP ONCE DAILY WITH 2 OZ OF WATER  378 g  3  . ferrous sulfate ([facility] IRON) 325 (65 FE) MG tablet Take 325 mg by mouth daily.        . furosemide (LASIX) 40 MG tablet TAKE 1 TABLET BY MOUTH EVERY DAY  30 tablet  8  . insulin aspart (NOVOLOG) 100 UNIT/ML injection Inject 5 Units into the skin 3 (three) times daily before meals.      . Insulin Pen Needle 31G X 8 MM MISC 1 each by Does not apply route daily.  100 each  1  . isosorbide mononitrate (ISMO,MONOKET) 10 MG tablet TAKE 1 TABLET EVERY DAY  30 tablet  5  . losartan-hydrochlorothiazide (HYZAAR) 100-25 MG per tablet TAKE 1 TABLET EVERY DAY  30 tablet  9  . omeprazole (PRILOSEC) 20 MG capsule TAKE ONE CAPSULE BY MOUTH EVERY MORNING  30 capsule  5  . simvastatin (ZOCOR) 40 MG tablet Take 1 tablet (40 mg total) by mouth at bedtime.  30 tablet  11  . traZODone (DESYREL) 100 MG tablet TAKE 1 TABLET BY MOUTH EVERY DAY AT BEDTIME  30 tablet  5  . potassium chloride (K-DUR) 10 MEQ tablet Take 1 tablet (10 mEq total)  by mouth daily.  30 tablet  4    No Known Allergies  Family History  Problem Relation Age of Onset  . Cancer Neg Hx   . Heart disease Neg Hx     BP 128/78  Pulse 69  Temp(Src) 98.4 F (36.9 C) (Oral)  SpO2 99%   Review of Systems denies hypoglycemia and hematuria.    Objective:   Physical Exam EXTEMITIES: left bka.  no ulcer on the right foot.  Right foot has normal color and temp.  no edema.  There is onychomycosis on the right toenails.  The right 5th toenail is surgically absent.   PULSES: dorsalis pedis intact on the right foot, but decreased from normal.   NEURO:   sensation is intact to touch on the right foot.   Lab Results  Component Value Date   WBC 8.3 10/13/2010   HGB 11.1* 10/13/2010   HCT 33.5* 10/13/2010   PLT 329.0 10/13/2010   GLUCOSE 93 07/23/2011   CHOL 106 10/13/2010   TRIG 76.0 10/13/2010   HDL 41.50  10/13/2010   LDLCALC 49 10/13/2010   ALT 16 10/13/2010   AST 20 10/13/2010   NA 140 07/23/2011   K 3.2* 07/23/2011   CL 100 07/23/2011   CREATININE 1.5* 07/23/2011   BUN 20 07/23/2011   CO2 25 07/23/2011   TSH 1.71 10/13/2010   HGBA1C 4.9 07/23/2011   MICROALBUR 1.0 10/13/2010      Assessment & Plan:  Renal insuff, stable DM.  overcontrolled Insomnia, well-controlled

## 2011-07-24 ENCOUNTER — Telehealth: Payer: Self-pay | Admitting: *Deleted

## 2011-07-24 NOTE — Telephone Encounter (Signed)
Called pt to inform of lab results, no answer/number disconnected (letter also mailed to pt).

## 2011-07-25 NOTE — Telephone Encounter (Signed)
Number disconnected/letter mailed to pt.

## 2011-09-20 ENCOUNTER — Encounter (HOSPITAL_COMMUNITY): Payer: Self-pay | Admitting: *Deleted

## 2011-09-20 ENCOUNTER — Other Ambulatory Visit: Payer: Self-pay | Admitting: *Deleted

## 2011-09-20 ENCOUNTER — Emergency Department (HOSPITAL_COMMUNITY): Payer: Medicare Other

## 2011-09-20 ENCOUNTER — Inpatient Hospital Stay (HOSPITAL_COMMUNITY)
Admission: EM | Admit: 2011-09-20 | Discharge: 2011-09-24 | DRG: 392 | Disposition: A | Discharge status: Home Health | Payer: Medicare Other | Attending: Internal Medicine | Admitting: Internal Medicine

## 2011-09-20 DIAGNOSIS — I739 Peripheral vascular disease, unspecified: Secondary | ICD-10-CM | POA: Diagnosis present

## 2011-09-20 DIAGNOSIS — M1990 Unspecified osteoarthritis, unspecified site: Secondary | ICD-10-CM

## 2011-09-20 DIAGNOSIS — E538 Deficiency of other specified B group vitamins: Secondary | ICD-10-CM

## 2011-09-20 DIAGNOSIS — E785 Hyperlipidemia, unspecified: Secondary | ICD-10-CM | POA: Diagnosis present

## 2011-09-20 DIAGNOSIS — Z86010 Personal history of colonic polyps: Secondary | ICD-10-CM

## 2011-09-20 DIAGNOSIS — K5792 Diverticulitis of intestine, part unspecified, without perforation or abscess without bleeding: Secondary | ICD-10-CM

## 2011-09-20 DIAGNOSIS — D638 Anemia in other chronic diseases classified elsewhere: Secondary | ICD-10-CM | POA: Diagnosis present

## 2011-09-20 DIAGNOSIS — E119 Type 2 diabetes mellitus without complications: Secondary | ICD-10-CM

## 2011-09-20 DIAGNOSIS — G4700 Insomnia, unspecified: Secondary | ICD-10-CM

## 2011-09-20 DIAGNOSIS — I6529 Occlusion and stenosis of unspecified carotid artery: Secondary | ICD-10-CM

## 2011-09-20 DIAGNOSIS — H269 Unspecified cataract: Secondary | ICD-10-CM

## 2011-09-20 DIAGNOSIS — G609 Hereditary and idiopathic neuropathy, unspecified: Secondary | ICD-10-CM

## 2011-09-20 DIAGNOSIS — E049 Nontoxic goiter, unspecified: Secondary | ICD-10-CM

## 2011-09-20 DIAGNOSIS — E109 Type 1 diabetes mellitus without complications: Secondary | ICD-10-CM | POA: Diagnosis present

## 2011-09-20 DIAGNOSIS — K5730 Diverticulosis of large intestine without perforation or abscess without bleeding: Secondary | ICD-10-CM | POA: Diagnosis present

## 2011-09-20 DIAGNOSIS — E860 Dehydration: Secondary | ICD-10-CM

## 2011-09-20 DIAGNOSIS — D509 Iron deficiency anemia, unspecified: Secondary | ICD-10-CM

## 2011-09-20 DIAGNOSIS — E1029 Type 1 diabetes mellitus with other diabetic kidney complication: Secondary | ICD-10-CM

## 2011-09-20 DIAGNOSIS — J309 Allergic rhinitis, unspecified: Secondary | ICD-10-CM

## 2011-09-20 DIAGNOSIS — K769 Liver disease, unspecified: Secondary | ICD-10-CM

## 2011-09-20 DIAGNOSIS — N179 Acute kidney failure, unspecified: Secondary | ICD-10-CM | POA: Diagnosis present

## 2011-09-20 DIAGNOSIS — G5600 Carpal tunnel syndrome, unspecified upper limb: Secondary | ICD-10-CM

## 2011-09-20 DIAGNOSIS — R112 Nausea with vomiting, unspecified: Secondary | ICD-10-CM | POA: Diagnosis present

## 2011-09-20 DIAGNOSIS — K649 Unspecified hemorrhoids: Secondary | ICD-10-CM

## 2011-09-20 DIAGNOSIS — S88119A Complete traumatic amputation at level between knee and ankle, unspecified lower leg, initial encounter: Secondary | ICD-10-CM

## 2011-09-20 DIAGNOSIS — I10 Essential (primary) hypertension: Secondary | ICD-10-CM | POA: Diagnosis present

## 2011-09-20 DIAGNOSIS — K5732 Diverticulitis of large intestine without perforation or abscess without bleeding: Principal | ICD-10-CM | POA: Diagnosis present

## 2011-09-20 DIAGNOSIS — R609 Edema, unspecified: Secondary | ICD-10-CM

## 2011-09-20 DIAGNOSIS — I2510 Atherosclerotic heart disease of native coronary artery without angina pectoris: Secondary | ICD-10-CM

## 2011-09-20 DIAGNOSIS — Z947 Corneal transplant status: Secondary | ICD-10-CM

## 2011-09-20 LAB — CBC WITH DIFFERENTIAL/PLATELET
Basophils Absolute: 0 10*3/uL (ref 0.0–0.1)
Basophils Relative: 0 % (ref 0–1)
Eosinophils Absolute: 0.1 10*3/uL (ref 0.0–0.7)
Eosinophils Relative: 1 % (ref 0–5)
HCT: 33.6 % — ABNORMAL LOW (ref 36.0–46.0)
Hemoglobin: 11.5 g/dL — ABNORMAL LOW (ref 12.0–15.0)
MCH: 30.6 pg (ref 26.0–34.0)
MCHC: 34.2 g/dL (ref 30.0–36.0)
Monocytes Absolute: 0.8 10*3/uL (ref 0.1–1.0)
Monocytes Relative: 11 % (ref 3–12)
Neutro Abs: 5.6 10*3/uL (ref 1.7–7.7)
RDW: 14 % (ref 11.5–15.5)

## 2011-09-20 LAB — COMPREHENSIVE METABOLIC PANEL
Albumin: 2.8 g/dL — ABNORMAL LOW (ref 3.5–5.2)
Alkaline Phosphatase: 71 U/L (ref 39–117)
BUN: 25 mg/dL — ABNORMAL HIGH (ref 6–23)
Calcium: 8.9 mg/dL (ref 8.4–10.5)
Creatinine, Ser: 1.77 mg/dL — ABNORMAL HIGH (ref 0.50–1.10)
GFR calc Af Amer: 29 mL/min — ABNORMAL LOW (ref >90)
Glucose, Bld: 141 mg/dL — ABNORMAL HIGH (ref 70–99)
Total Protein: 6.5 g/dL (ref 6.0–8.3)

## 2011-09-20 LAB — LACTIC ACID, PLASMA: Lactic Acid, Venous: 2.3 mmol/L — ABNORMAL HIGH (ref 0.5–2.2)

## 2011-09-20 MED ORDER — ONDANSETRON HCL 4 MG/2ML IJ SOLN
4.0000 mg | Freq: Once | INTRAMUSCULAR | Status: AC
  Administered 2011-09-20: 4 mg via INTRAVENOUS
  Filled 2011-09-20: qty 2

## 2011-09-20 MED ORDER — SODIUM CHLORIDE 0.9 % IV SOLN
INTRAVENOUS | Status: DC
  Administered 2011-09-20 – 2011-09-21 (×4): via INTRAVENOUS
  Administered 2011-09-22: 1000 mL via INTRAVENOUS

## 2011-09-20 MED ORDER — INSULIN ASPART 100 UNIT/ML ~~LOC~~ SOLN: 0.0000 [IU] | Freq: Every day | SUBCUTANEOUS | Status: DC

## 2011-09-20 MED ORDER — INSULIN ASPART 100 UNIT/ML ~~LOC~~ SOLN
0.0000 [IU] | Freq: Three times a day (TID) | SUBCUTANEOUS | Status: DC
  Administered 2011-09-21 (×2): 2 [IU] via SUBCUTANEOUS
  Administered 2011-09-21: 5 [IU] via SUBCUTANEOUS
  Administered 2011-09-22: 2 [IU] via SUBCUTANEOUS
  Administered 2011-09-22: 3 [IU] via SUBCUTANEOUS
  Administered 2011-09-22: 2 [IU] via SUBCUTANEOUS
  Administered 2011-09-23: 5 [IU] via SUBCUTANEOUS
  Administered 2011-09-23: 3 [IU] via SUBCUTANEOUS
  Administered 2011-09-24: 2 [IU] via SUBCUTANEOUS
  Administered 2011-09-24: 8 [IU] via SUBCUTANEOUS

## 2011-09-20 MED ORDER — SODIUM CHLORIDE 0.9 % IV SOLN
3.0000 g | Freq: Once | INTRAVENOUS | Status: AC
  Administered 2011-09-20: 3 g via INTRAVENOUS
  Filled 2011-09-20: qty 3

## 2011-09-20 MED ORDER — FENTANYL CITRATE 0.05 MG/ML IJ SOLN
50.0000 ug | Freq: Once | INTRAMUSCULAR | Status: AC
  Administered 2011-09-20: 50 ug via INTRAVENOUS
  Filled 2011-09-20: qty 2

## 2011-09-20 NOTE — H&P (Signed)
[facility] History and Physical  Angelica Lawrence GNF:[id number] DOB: [birth year] DOA: 09/20/2011   PCP:   [name], MD   Chief Complaint: Abdominal pain nausea and vomiting   HPI: Angelica Lawrence is an 78 y.o. female with history of diverticulosis, reportedly last colonoscopy about 2 years ago, history of hypertension, hyperlipidemia, peripheral vascular disease status post left AKA, diabetes type 1, status post cholecystectomy and hysterectomy, presents to emergency room with two-day history of right-sided abdominal pain, mild watery diarrhea, and nausea with no vomiting. She denied any shortness of breath chest pain, fever or chills, black stool bloody stool, dysuria, polyuria, or any back pain. Evaluation in emergency room included an abdominal pelvic CT which showed evidence of cecum diverticulitis with no abscess. She has a normal white count, slight elevation of her creatinine to 1.7 with normal electrolytes. Hospitalist was asked to admit her for right-sided acute diverticulitis.  Rewiew of Systems:  The patient denies anorexia, fever, weight loss,, vision loss, decreased hearing, hoarseness, chest pain, syncope, dyspnea on exertion, peripheral edema, balance deficits, hemoptysis, melena, hematochezia, severe indigestion/heartburn, hematuria, incontinence, genital sores, muscle weakness, suspicious skin lesions, transient blindness, difficulty walking, depression, unusual weight change, abnormal bleeding, enlarged lymph nodes, angioedema, and breast masses.    Past Medical History  Diagnosis Date  . ALLERGIC RHINITIS 12/30/2006  . ANEMIA, IRON DEFICIENCY 10/21/2009  . COLONIC POLYPS, HX OF 12/30/2006  . CORONARY ARTERY DISEASE 12/30/2006  . DIABETES MELLITUS, TYPE I 12/30/2006  . DIVERTICULOSIS, COLON 12/30/2006  . HYPERLIPIDEMIA 12/30/2006  . HYPERTENSION 12/30/2006  . PERIPHERAL NEUROPATHY 12/30/2006  . PERIPHERAL VASCULAR DISEASE 12/30/2006  . GOITER 12/30/2006  . VITAMIN  B12 DEFICIENCY 09/16/2008  . Carpal tunnel syndrome 12/30/2006  . CATARACTS, BILATERAL 12/30/2006  . HEMORRHOIDS 12/30/2006  . DEGENERATIVE JOINT DISEASE 02/10/2007  . INSOMNIA 10/21/2009  . CORNEAL TRANSPLANT 12/30/2006  . BKA, LEFT LEG 12/30/2006    Past Surgical History  Procedure Date  . Appendectomy   . Cholecystectomy   . Abdominal hysterectomy 1960's    and bso  . Below knee leg amputation 1989    L    Medications:  HOME MEDS: Prior to Admission medications   Medication Sig Start Date End Date Taking? Authorizing Provider  aspirin 81 MG tablet Take 81 mg by mouth daily.     Yes Historical Provider, MD  atenolol (TENORMIN) 25 MG tablet TAKE 1 TABLET BY MOUTH EVERY DAY 04/26/11  Yes [name], MD  cholestyramine Lanetta Inch) 4 GM/DOSE powder USE 1 SCOOP ONCE DAILY WITH 2 OZ OF WATER 07/18/11  Yes [name], MD  ferrous sulfate ([facility] IRON) 325 (65 FE) MG tablet Take 325 mg by mouth daily.     Yes Historical Provider, MD  furosemide (LASIX) 40 MG tablet TAKE 1 TABLET BY MOUTH EVERY DAY 01/29/11  Yes [name], MD  insulin aspart (NOVOLOG) 100 UNIT/ML injection Inject 5 Units into the skin 3 (three) times daily before meals. 06/15/11 06/14/12 Yes [name], MD  Insulin Pen Needle 31G X 8 MM MISC 1 each by Does not apply route daily. 06/20/10  Yes [name], MD  isosorbide mononitrate (ISMO,MONOKET) 10 MG tablet TAKE 1 TABLET EVERY DAY 06/24/11  Yes [name], MD  losartan-hydrochlorothiazide (HYZAAR) 100-25 MG per tablet TAKE 1 TABLET EVERY DAY 12/23/10  Yes [name], MD  omeprazole (PRILOSEC) 20 MG capsule TAKE ONE CAPSULE BY MOUTH EVERY MORNING 11/08/10  Yes [name], MD  potassium chloride (K-DUR) 10 MEQ tablet Take 1  tablet (10 mEq total) by mouth daily. 07/23/11 07/22/12 Yes [name], MD  simvastatin (ZOCOR) 40 MG tablet Take 1 tablet (40 mg total) by mouth at bedtime. 10/23/10  Yes [name], MD  traZODone (DESYREL) 100 MG tablet TAKE 1 TABLET BY MOUTH EVERY  DAY AT BEDTIME 04/19/11  Yes [name], MD     Allergies:  No Known Allergies  Social History:   reports that she has never smoked. She does not have any smokeless tobacco history on file. She reports that she does not drink alcohol or use illicit drugs.  Family History: Family History  Problem Relation Age of Onset  . Cancer Neg Hx   . Heart disease Neg Hx      Physical Exam: Filed Vitals:   09/20/11 2230 09/20/11 2330 09/20/11 2345 09/20/11 2348  BP: 137/70 144/66 143/59 143/59  Pulse: 86 94 85 105  Temp:      TempSrc:      Resp:    16  SpO2: 98% 96% 97% 97%   Blood pressure 143/59, pulse 105, temperature 98.9 F (37.2 C), temperature source Oral, resp. rate 16, SpO2 97.00%.  GEN:  Pleasant person lying in the stretcher in no acute distress; cooperative with exam PSYCH:  alert and oriented x4; does not appear anxious or depressed; affect is appropriate. HEENT: Mucous membranes pink and anicteric; PERRLA; EOM intact; no cervical lymphadenopathy nor thyromegaly or carotid bruit; no JVD; Breasts:: Not examined CHEST WALL: No tenderness CHEST: Normal respiration, clear to auscultation bilaterally HEART: Regular rate and rhythm; no murmurs rubs or gallops BACK: No kyphosis or scoliosis; no CVA tenderness ABDOMEN: Obese, soft non-tender; no masses, no organomegaly, normal abdominal bowel sounds; no pannus; no intertriginous candida. Rectal Exam: Not done EXTREMITIES: She is status post left BKA; age-appropriate arthropathy of the hands and knees; no edema; no ulcerations. Genitalia: not examined PULSES: 2+ and symmetric SKIN: Normal hydration no rash or ulceration CNS: Cranial nerves 2-12 grossly intact no focal lateralizing neurologic deficit   Labs on Admission:  Basic Metabolic Panel:  Lab 09/20/11 1610  NA 134*  K 3.9  CL 103  CO2 21  GLUCOSE 141*  BUN 25*  CREATININE 1.77*  CALCIUM 8.9  MG --  PHOS --   Liver Function Tests:  Lab 09/20/11 2000  AST  25  ALT 14  ALKPHOS 71  BILITOT 0.4  PROT 6.5  ALBUMIN 2.8*    Lab 09/20/11 2000  LIPASE 14  AMYLASE --   No results found for this basename: AMMONIA:5 in the last 168 hours CBC:  Lab 09/20/11 2000  WBC 7.5  NEUTROABS 5.6  HGB 11.5*  HCT 33.6*  MCV 89.4  PLT 134*    Radiological Exams on Admission: Ct Abdomen Pelvis Wo Contrast  09/20/2011  *RADIOLOGY REPORT*  Clinical Data: Abdominal pain  CT ABDOMEN AND PELVIS WITHOUT CONTRAST  Technique:  Multidetector CT imaging of the abdomen and pelvis was performed following the standard protocol without intravenous contrast.  Comparison: The 01/16/2007  Findings: There is inflammatory changes involving the cecum associated with diverticulosis.  There is wall thickening in the cecum.  The cecal walls also indistinct.  The appendix is gas- filled and within normal limits other than adjacent inflammatory changes.  There is extensive diverticulosis throughout the length of the colon.  There is no evidence of sigmoid diverticulitis. Stranding in the presacral region is nonspecific.  Bladder is distended.  Extensive vascular calcifications.  Moderate sized hiatal hernia is present.  Coronary  artery calcifications.  Post cholecystectomy.  Pancreas, liver and spleen are grossly within normal limits.  Adrenal glands are unremarkable.  Chronic changes of the kidneys.  Vascular calcifications in the kidneys are noted.  There is stranding in the lower abdominal subcutaneous fat of unknown significance.  Developing cellulitis cannot be excluded.  IMPRESSION: Findings most consistent with acute diverticulitis at the level of the cecum.  No abscess or perforation.  There is stranding in the lower abdominal subcutaneous fat. Consider cellulitis.  Original Report Authenticated By: Donavan Burnet, M.D.      Assessment/Plan Present on Admission:  .DIVERTICULOSIS, COLON .Diverticulitis of cecum .DM (diabetes mellitus) .HYPERTENSION .PERIPHERAL VASCULAR  DISEASE .HYPERLIPIDEMIA .Dehydration, mild  PLAN:  She will start on IV Unasyn for her right-sided diverticulitis. Will admit her to the hospital and continue the same. I will continue her losartan but will stop her diuretic. She'll be given very gentle IV fluid. For her diabetes, since she is on clear liquid, will give D5 normal with 20 of K., and use insulin sliding scale to cover her hyperglycemia. For her hypertension and hyperlipidemia, will continue her medications. She is very stable, full code (I did confirm with her tonight), and will be [facility].   Other plans as per orders.  Code Status: Full code Family Communication: Husband and daughter at bedside aware of plans Disposition Plan: Observation status, [facility].   Callan Yontz Nocturnist [facility] Pager [phone]  Adin Laker 09/20/2011, 11:58 PM

## 2011-09-20 NOTE — ED Provider Notes (Addendum)
History     CSN: [id number]  Arrival date & time 09/20/11  1027   First MD Initiated Contact with Patient 09/20/11 1922      Chief Complaint  Patient presents with  . Abdominal Pain     The history is provided by the patient and the EMS personnel.    Pt c/o upper abd pain x 2 days. Reports emesis and diarrhea x2days. Hx of diabetes, cbg 118. Pain 10/10.FEVER unknown.    Past Medical History  Diagnosis Date  . ALLERGIC RHINITIS 12/30/2006  . ANEMIA, IRON DEFICIENCY 10/21/2009  . COLONIC POLYPS, HX OF 12/30/2006  . CORONARY ARTERY DISEASE 12/30/2006  . DIABETES MELLITUS, TYPE I 12/30/2006  . DIVERTICULOSIS, COLON 12/30/2006  . HYPERLIPIDEMIA 12/30/2006  . HYPERTENSION 12/30/2006  . PERIPHERAL NEUROPATHY 12/30/2006  . PERIPHERAL VASCULAR DISEASE 12/30/2006  . GOITER 12/30/2006  . VITAMIN B12 DEFICIENCY 09/16/2008  . Carpal tunnel syndrome 12/30/2006  . CATARACTS, BILATERAL 12/30/2006  . HEMORRHOIDS 12/30/2006  . DEGENERATIVE JOINT DISEASE 02/10/2007  . INSOMNIA 10/21/2009  . CORNEAL TRANSPLANT 12/30/2006  . BKA, LEFT LEG 12/30/2006    Past Surgical History  Procedure Date  . Appendectomy   . Cholecystectomy   . Abdominal hysterectomy 1960's    and bso  . Below knee leg amputation 1989    L    Family History  Problem Relation Age of Onset  . Cancer Neg Hx   . Heart disease Neg Hx     History  Substance Use Topics  . Smoking status: Never Smoker   . Smokeless tobacco: Not on file  . Alcohol Use: No    OB History    Grav Para Term Preterm Abortions TAB SAB Ect Mult Living                  Review of Systems  All other systems reviewed and are negative.    Allergies  Review of patient's allergies indicates no known allergies.  Home Medications   Current Outpatient Rx  Name Route Sig Dispense Refill  . ASPIRIN 81 MG PO TABS Oral Take 81 mg by mouth daily.      . ATENOLOL 25 MG PO TABS  TAKE 1 TABLET BY MOUTH EVERY DAY 30 tablet 5  . CHOLESTYRAMINE  4 GM/DOSE PO POWD  USE 1 SCOOP ONCE DAILY WITH 2 OZ OF WATER 378 g 3  . FERROUS SULFATE 325 (65 FE) MG PO TABS Oral Take 325 mg by mouth daily.      . FUROSEMIDE 40 MG PO TABS  TAKE 1 TABLET BY MOUTH EVERY DAY 30 tablet 8  . INSULIN ASPART 100 UNIT/ML [place] SOLN Subcutaneous Inject 5 Units into the skin 3 (three) times daily before meals.    . INSULIN PEN NEEDLE 31G X 8 MM MISC Does not apply 1 each by Does not apply route daily. 100 each 1  . ISOSORBIDE MONONITRATE 10 MG PO TABS  TAKE 1 TABLET EVERY DAY 30 tablet 5  . LOSARTAN POTASSIUM-HCTZ 100-25 MG PO TABS  TAKE 1 TABLET EVERY DAY 30 tablet 9  . OMEPRAZOLE 20 MG PO CPDR  TAKE ONE CAPSULE BY MOUTH EVERY MORNING 30 capsule 5  . POTASSIUM CHLORIDE ER 10 MEQ PO TBCR Oral Take 1 tablet (10 mEq total) by mouth daily. 30 tablet 4  . SIMVASTATIN 40 MG PO TABS Oral Take 1 tablet (40 mg total) by mouth at bedtime. 30 tablet 11  . TRAZODONE HCL 100 MG PO TABS  TAKE  1 TABLET BY MOUTH EVERY DAY AT BEDTIME 30 tablet 5    BP 137/70  Pulse 86  Temp 98.9 F (37.2 C) (Oral)  Resp 16  SpO2 98%  Physical Exam  Nursing note and vitals reviewed. Constitutional: She is oriented to person, place, and time. She appears well-developed and well-nourished. No distress.  HENT:  Head: Normocephalic and atraumatic.  Eyes: Pupils are equal, round, and reactive to light.  Neck: Normal range of motion.  Cardiovascular: Normal rate and intact distal pulses.   Pulmonary/Chest: No respiratory distress.  Abdominal: Normal appearance. She exhibits no distension. There is tenderness in the right upper quadrant and right lower quadrant. There is no rebound. No hernia.  Musculoskeletal: Normal range of motion.  Neurological: She is alert and oriented to person, place, and time. No cranial nerve deficit.  Skin: Skin is warm and dry. No rash noted.  Psychiatric: She has a normal mood and affect. Her behavior is normal.    ED Course  Procedures (including critical care  time)  Scheduled Meds:   . ampicillin-sulbactam (UNASYN) IV  3 g Intravenous Once  . fentaNYL  50 mcg Intravenous Once  . fentaNYL  50 mcg Intravenous Once  . insulin aspart  0-15 Units Subcutaneous TID WC  . insulin aspart  0-5 Units Subcutaneous QHS  . ondansetron  4 mg Intravenous Once   Continuous Infusions:   . sodium chloride 125 mL/hr at 09/20/11 2000   PRN Meds:.   Labs Reviewed  CBC WITH DIFFERENTIAL - Abnormal; Notable for the following:    RBC 3.76 (*)     Hemoglobin 11.5 (*)     HCT 33.6 (*)     Platelets 134 (*)     All other components within normal limits  COMPREHENSIVE METABOLIC PANEL - Abnormal; Notable for the following:    Sodium 134 (*)     Glucose, Bld 141 (*)     BUN 25 (*)     Creatinine, Ser 1.77 (*)     Albumin 2.8 (*)     GFR calc non Af Amer 25 (*)     GFR calc Af Amer 29 (*)     All other components within normal limits  LACTIC ACID, PLASMA - Abnormal; Notable for the following:    Lactic Acid, Venous 2.3 (*)     All other components within normal limits  LIPASE, BLOOD  HEMOGLOBIN A1C   Ct Abdomen Pelvis Wo Contrast  09/20/2011  *RADIOLOGY REPORT*  Clinical Data: Abdominal pain  CT ABDOMEN AND PELVIS WITHOUT CONTRAST  Technique:  Multidetector CT imaging of the abdomen and pelvis was performed following the standard protocol without intravenous contrast.  Comparison: The 01/16/2007  Findings: There is inflammatory changes involving the cecum associated with diverticulosis.  There is wall thickening in the cecum.  The cecal walls also indistinct.  The appendix is gas- filled and within normal limits other than adjacent inflammatory changes.  There is extensive diverticulosis throughout the length of the colon.  There is no evidence of sigmoid diverticulitis. Stranding in the presacral region is nonspecific.  Bladder is distended.  Extensive vascular calcifications.  Moderate sized hiatal hernia is present.  Coronary artery calcifications.  Post  cholecystectomy.  Pancreas, liver and spleen are grossly within normal limits.  Adrenal glands are unremarkable.  Chronic changes of the kidneys.  Vascular calcifications in the kidneys are noted.  There is stranding in the lower abdominal subcutaneous fat of unknown significance.  Developing cellulitis cannot be excluded.  IMPRESSION: Findings most consistent with acute diverticulitis at the level of the cecum.  No abscess or perforation.  There is stranding in the lower abdominal subcutaneous fat. Consider cellulitis.  Original Report Authenticated By: Donavan Burnet, M.D.     1. Diverticulitis   2. Dehydration, mild   3. Diverticulitis of cecum   4. DM (diabetes mellitus)       MDM          Nelia Shi, MD 09/20/11 1610  Nelia Shi, MD 11/08/11 [phone]

## 2011-09-20 NOTE — ED Notes (Signed)
ZOX:[id number]<AV> Expected date:09/20/11<BR> Expected time: 6:18 PM<BR> Means of arrival:Ambulance<BR> Comments:<BR> Upper abd pain

## 2011-09-20 NOTE — ED Notes (Signed)
Per EMS: Pt c/o upper abd pain x 2 days. Reports emesis and diarrhea x2days. Hx of diabetes, cbg 118. Pain 10/10

## 2011-09-21 DIAGNOSIS — G5600 Carpal tunnel syndrome, unspecified upper limb: Secondary | ICD-10-CM

## 2011-09-21 LAB — CBC
HCT: 31.7 % — ABNORMAL LOW (ref 36.0–46.0)
MCH: 30.7 pg (ref 26.0–34.0)
MCV: 89.3 fL (ref 78.0–100.0)
Platelets: 123 10*3/uL — ABNORMAL LOW (ref 150–400)
RDW: 14 % (ref 11.5–15.5)
WBC: 7.2 10*3/uL (ref 4.0–10.5)

## 2011-09-21 LAB — HEMOGLOBIN A1C: Mean Plasma Glucose: 126 mg/dL — ABNORMAL HIGH (ref <117)

## 2011-09-21 LAB — BASIC METABOLIC PANEL
CO2: 21 mEq/L (ref 19–32)
Calcium: 8.3 mg/dL — ABNORMAL LOW (ref 8.4–10.5)
Chloride: 106 mEq/L (ref 96–112)
Creatinine, Ser: 1.52 mg/dL — ABNORMAL HIGH (ref 0.50–1.10)
Glucose, Bld: 154 mg/dL — ABNORMAL HIGH (ref 70–99)

## 2011-09-21 LAB — GLUCOSE, CAPILLARY: Glucose-Capillary: 147 mg/dL — ABNORMAL HIGH (ref 70–99)

## 2011-09-21 MED ORDER — MORPHINE SULFATE 2 MG/ML IJ SOLN
2.0000 mg | INTRAMUSCULAR | Status: DC | PRN
  Administered 2011-09-22: 2 mg via INTRAVENOUS
  Filled 2011-09-21: qty 1

## 2011-09-21 MED ORDER — PANTOPRAZOLE SODIUM 40 MG PO TBEC
40.0000 mg | DELAYED_RELEASE_TABLET | Freq: Every day | ORAL | Status: DC
  Administered 2011-09-21 – 2011-09-24 (×4): 40 mg via ORAL
  Filled 2011-09-21 (×4): qty 1

## 2011-09-21 MED ORDER — ASPIRIN EC 81 MG PO TBEC
81.0000 mg | DELAYED_RELEASE_TABLET | Freq: Every day | ORAL | Status: DC
  Administered 2011-09-21 – 2011-09-24 (×4): 81 mg via ORAL
  Filled 2011-09-21 (×4): qty 1

## 2011-09-21 MED ORDER — SODIUM CHLORIDE 0.9 % IV SOLN
1.5000 g | Freq: Four times a day (QID) | INTRAVENOUS | Status: DC
  Administered 2011-09-21 – 2011-09-24 (×13): 1.5 g via INTRAVENOUS
  Filled 2011-09-21 (×16): qty 1.5

## 2011-09-21 MED ORDER — POTASSIUM CHLORIDE ER 10 MEQ PO TBCR
10.0000 meq | EXTENDED_RELEASE_TABLET | Freq: Every day | ORAL | Status: DC
  Administered 2011-09-21: 10 meq via ORAL
  Filled 2011-09-21 (×2): qty 1

## 2011-09-21 MED ORDER — ATENOLOL 25 MG PO TABS
25.0000 mg | ORAL_TABLET | Freq: Every day | ORAL | Status: DC
  Administered 2011-09-21: 25 mg via ORAL
  Filled 2011-09-21: qty 1

## 2011-09-21 MED ORDER — ISOSORBIDE MONONITRATE 10 MG PO TABS
10.0000 mg | ORAL_TABLET | Freq: Every day | ORAL | Status: DC
  Administered 2011-09-21 – 2011-09-24 (×4): 10 mg via ORAL
  Filled 2011-09-21 (×4): qty 1

## 2011-09-21 MED ORDER — SODIUM CHLORIDE 0.9 % IV SOLN
INTRAVENOUS | Status: AC
  Administered 2011-09-21: 01:00:00 via INTRAVENOUS

## 2011-09-21 MED ORDER — ONDANSETRON HCL 4 MG PO TABS: 4.0000 mg | ORAL_TABLET | Freq: Four times a day (QID) | ORAL | Status: DC | PRN

## 2011-09-21 MED ORDER — KCL IN DEXTROSE-NACL 20-5-0.9 MEQ/L-%-% IV SOLN
INTRAVENOUS | Status: DC
  Administered 2011-09-21: 03:00:00 via INTRAVENOUS
  Filled 2011-09-21: qty 1000

## 2011-09-21 MED ORDER — ATENOLOL 50 MG PO TABS
50.0000 mg | ORAL_TABLET | Freq: Every day | ORAL | Status: DC
  Administered 2011-09-22 – 2011-09-24 (×3): 50 mg via ORAL
  Filled 2011-09-21 (×3): qty 1

## 2011-09-21 MED ORDER — TRAZODONE HCL 50 MG PO TABS
50.0000 mg | ORAL_TABLET | Freq: Every evening | ORAL | Status: DC | PRN
  Administered 2011-09-22 – 2011-09-23 (×2): 50 mg via ORAL
  Filled 2011-09-21 (×3): qty 1

## 2011-09-21 MED ORDER — SIMVASTATIN 40 MG PO TABS
40.0000 mg | ORAL_TABLET | Freq: Every day | ORAL | Status: DC
  Administered 2011-09-21 – 2011-09-23 (×3): 40 mg via ORAL
  Filled 2011-09-21 (×4): qty 1

## 2011-09-21 MED ORDER — ENOXAPARIN SODIUM 40 MG/0.4ML ~~LOC~~ SOLN
40.0000 mg | SUBCUTANEOUS | Status: DC
  Administered 2011-09-21 – 2011-09-24 (×4): 40 mg via SUBCUTANEOUS
  Filled 2011-09-21 (×4): qty 0.4

## 2011-09-21 MED ORDER — LOSARTAN POTASSIUM 50 MG PO TABS
100.0000 mg | ORAL_TABLET | Freq: Every day | ORAL | Status: DC
  Administered 2011-09-21 – 2011-09-24 (×4): 100 mg via ORAL
  Filled 2011-09-21 (×4): qty 2

## 2011-09-21 MED ORDER — ONDANSETRON HCL 4 MG/2ML IJ SOLN: 4.0000 mg | Freq: Four times a day (QID) | INTRAMUSCULAR | Status: DC | PRN

## 2011-09-21 NOTE — ED Notes (Signed)
Report given to receiving RN.

## 2011-09-21 NOTE — Progress Notes (Signed)
Called to pt room re: arm pain.  Pt's IV infusing in rt hand infiltrated. Edema noted to  hand/arm mid-way to elbow.  IV stopped.   New IV access obtained.  Warm compress applied to rt hand.  dph

## 2011-09-21 NOTE — Progress Notes (Signed)
Patient ID: Angelica Lawrence, female   DOB: 10-28-1926, 78 y.o.   MRN: [id number]  [facility] PROGRESS NOTE  JAUNICE MIRZA [id number] DOB: Jul 22, 1926 DOA: 09/20/2011 PCP: [name], MD  Brief narrative: Pt is 78 yo female who was admitted 09/20/2011 with abdominal pain, nausea and vomiting and currently being treated for acute diverticulitis.   Principal Problem:  *Diverticulitis of cecum - pt reports clinical improvement since admission - will continue current medication regimen with antibiotic as well as supportive care with antiemetics and analgesia as needed - will advance the diet as pt is tolerating - obtain CBC and BMP in AM  Active Problems:  Acute renal failure  - likely secondary to pre renal etiology, dehydration - will continue IVF - BMP in AM   Anemia of chronic disease - Hg and Hct stable and at pt's baseline - CBC in AM   HYPERLIPIDEMIA - stable  - continue statin   HYPERTENSION - will continue to monitor vitals per floor protocol   DM (diabetes mellitus) - will check A1C - continue to monitor CBG per floor protocol   Dehydration, mild - will provide supportive care with IVF and will follow up on electrolyte panel   Consultants:  None  Procedures:  None  Antibiotics:  Unasyn 09/20/2011 -->  HPI/Subjective: Pt feeling better this AM.  Objective: Filed Vitals:   09/20/11 2348 09/21/11 0043 09/21/11 0600 09/21/11 1414  BP: 143/59 142/81 141/64 116/66  Pulse: 105 94 78 67  Temp:  99.3 F (37.4 C) 98.7 F (37.1 C) 98.5 F (36.9 C)  TempSrc:  Oral Oral Oral  Resp: 16 18 18 18   SpO2: 97% 97% 96% 96%    Intake/Output Summary (Last 24 hours) at 09/21/11 1642 Last data filed at 09/21/11 1557  Gross per 24 hour  Intake 693.33 ml  Output   1600 ml  Net -906.67 ml    Exam:   General:  Pt is alert, follows commands appropriately, not in acute distress  Cardiovascular: Regular rhythm, tachycardic, S1/S2, no murmurs, no rubs,  no gallops  Respiratory: Clear to auscultation bilaterally, no wheezing, no crackles, no rhonchi  Abdomen: Soft, non tender, non distended, bowel sounds present, no guarding  Extremities: left BKA, no edema  Neuro: Grossly nonfocal  Data Reviewed: Basic Metabolic Panel:  Lab 09/21/11 5784 09/20/11 2000  NA 136 134*  K 3.8 3.9  CL 106 103  CO2 21 21  GLUCOSE 154* 141*  BUN 22 25*  CREATININE 1.52* 1.77*  CALCIUM 8.3* 8.9  MG -- --  PHOS -- --   Liver Function Tests:  Lab 09/20/11 2000  AST 25  ALT 14  ALKPHOS 71  BILITOT 0.4  PROT 6.5  ALBUMIN 2.8*    Lab 09/20/11 2000  LIPASE 14  AMYLASE --   CBC:  Lab 09/21/11 0435 09/20/11 2000  WBC 7.2 7.5  NEUTROABS -- 5.6  HGB 10.9* 11.5*  HCT 31.7* 33.6*  MCV 89.3 89.4  PLT 123* 134*    CBG:  Lab 09/21/11 1126 09/21/11 0729  GLUCAP 205* 140*    Recent Results (from the past 240 hour(s))  MRSA PCR SCREENING     Status: Normal   Collection Time   09/21/11 12:24 PM      Component Value Range Status Comment   MRSA by PCR NEGATIVE  NEGATIVE Final      Studies: Ct Abdomen Pelvis Wo Contrast  09/20/2011  *RADIOLOGY REPORT*  Clinical Data: Abdominal pain  CT ABDOMEN  AND PELVIS WITHOUT CONTRAST  Technique:  Multidetector CT imaging of the abdomen and pelvis was performed following the standard protocol without intravenous contrast.  Comparison: The 01/16/2007  Findings: There is inflammatory changes involving the cecum associated with diverticulosis.  There is wall thickening in the cecum.  The cecal walls also indistinct.  The appendix is gas- filled and within normal limits other than adjacent inflammatory changes.  There is extensive diverticulosis throughout the length of the colon.  There is no evidence of sigmoid diverticulitis. Stranding in the presacral region is nonspecific.  Bladder is distended.  Extensive vascular calcifications.  Moderate sized hiatal hernia is present.  Coronary artery calcifications.  Post  cholecystectomy.  Pancreas, liver and spleen are grossly within normal limits.  Adrenal glands are unremarkable.  Chronic changes of the kidneys.  Vascular calcifications in the kidneys are noted.  There is stranding in the lower abdominal subcutaneous fat of unknown significance.  Developing cellulitis cannot be excluded.  IMPRESSION: Findings most consistent with acute diverticulitis at the level of the cecum.  No abscess or perforation.  There is stranding in the lower abdominal subcutaneous fat. Consider cellulitis.  Original Report Authenticated By: Donavan Burnet, M.D.    Scheduled Meds:   . sodium chloride   Intravenous STAT  . ampicillin-sulbactam (UNASYN) IV  1.5 g Intravenous Q6H  . ampicillin-sulbactam (UNASYN) IV  3 g Intravenous Once  . aspirin EC  81 mg Oral Daily  . atenolol  25 mg Oral Daily  . enoxaparin (LOVENOX) injection  40 mg Subcutaneous Q24H  . fentaNYL  50 mcg Intravenous Once  . fentaNYL  50 mcg Intravenous Once  . insulin aspart  0-15 Units Subcutaneous TID WC  . insulin aspart  0-5 Units Subcutaneous QHS  . isosorbide mononitrate  10 mg Oral Daily  . losartan  100 mg Oral Daily  . ondansetron  4 mg Intravenous Once  . pantoprazole  40 mg Oral Q1200  . potassium chloride  10 mEq Oral Daily  . simvastatin  40 mg Oral QHS   Continuous Infusions:   . sodium chloride 125 mL/hr at 09/21/11 1223  . DISCONTD: dextrose 5 % and 0.9 % NaCl with KCl 20 mEq/L 50 mL/hr at 09/21/11 0305     Code Status: Full Family Communication: Pt at bedside Disposition Plan: PT evaluation  Debbora Presto, MD  [facility] Pager [phone]  If 7PM-7AM, please contact night-coverage [email or web address] Password [email or web address] 09/21/2011, 4:42 PM   LOS: 1 day

## 2011-09-22 DIAGNOSIS — I2510 Atherosclerotic heart disease of native coronary artery without angina pectoris: Secondary | ICD-10-CM

## 2011-09-22 LAB — BASIC METABOLIC PANEL
BUN: 13 mg/dL (ref 6–23)
CO2: 15 mEq/L — ABNORMAL LOW (ref 19–32)
Calcium: 8.3 mg/dL — ABNORMAL LOW (ref 8.4–10.5)
Chloride: 114 mEq/L — ABNORMAL HIGH (ref 96–112)
Creatinine, Ser: 1.35 mg/dL — ABNORMAL HIGH (ref 0.50–1.10)
GFR calc non Af Amer: 42 mL/min — ABNORMAL LOW (ref >90)
Glucose, Bld: 160 mg/dL — ABNORMAL HIGH (ref 70–99)
Sodium: 143 mEq/L (ref 135–145)

## 2011-09-22 LAB — CBC
HCT: 32.2 % — ABNORMAL LOW (ref 36.0–46.0)
Hemoglobin: 11.3 g/dL — ABNORMAL LOW (ref 12.0–15.0)
MCV: 87.7 fL (ref 78.0–100.0)
Platelets: ADEQUATE 10*3/uL (ref 150–400)
RBC: 3.67 MIL/uL — ABNORMAL LOW (ref 3.87–5.11)
WBC: 6.5 10*3/uL (ref 4.0–10.5)

## 2011-09-22 LAB — GLUCOSE, CAPILLARY: Glucose-Capillary: 145 mg/dL — ABNORMAL HIGH (ref 70–99)

## 2011-09-22 NOTE — Evaluation (Addendum)
Physical Therapy Evaluation Patient Details Name: Angelica Lawrence MRN: [id number] DOB: 1927-01-26 Today's Date: 09/22/2011 Time: 4098-1191 PT Time Calculation (min): 16 min  PT Assessment / Plan / Recommendation Clinical Impression  78 yo female admitted for abdominal pain who has been ambulating household distances with a RW and BKA prosthesis and uses a wheelchair for out of home  for many years. Husband and family help her at home. Pt had NEW FINDING OF RIGHT FOOT DROP today that surprised her.  She reported no pain, numbness or tingling and no pain in back.  RN informed.     PT Assessment  Patient needs continued PT services    Follow Up Recommendations  Home health PT    Barriers to Discharge        Equipment Recommendations  None recommended by PT    Recommendations for Other Services OT consult   Frequency Min 3X/week    Precautions / Restrictions Precautions Required Braces or Orthoses: Other Brace/Splint Other Brace/Splint: right BKA prosthesis that patient has had for many years.  Husband helps her with don/doff Restrictions Weight Bearing Restrictions: Yes   Pertinent Vitals/Pain Pt dyspnea on exertion, but O2 sats 98% on room air      Mobility  Bed Mobility Bed Mobility: Supine to Sit;Sitting - Scoot to Edge of Bed Supine to Sit: 6: Modified independent (Device/Increase time) Sitting - Scoot to Edge of Bed: 6: Modified independent (Device/Increase time) Details for Bed Mobility Assistance: pt is able to move self, but needs some extra time for scooting Transfers Transfers: Sit to Stand;Stand to Sit Sit to Stand: 4: Min guard;From elevated surface;With upper extremity assist Stand to Sit: 4: Min guard;With armrests Details for Transfer Assistance: pt reports husband helps her stand at home "grab  under my shoulders" Ambulation/Gait Ambulation/Gait Assistance: 3: Mod assist Ambulation Distance (Feet): 6 Feet Assistive device: Rolling walker Ambulation/Gait  Assistance Details: pt with min assist to don prosthesis.  Assist for sit to stand and to control balance in gait Gait Pattern: Step-to pattern;Decreased step length - right;Decreased step length - left;Right steppage;Decreased dorsiflexion - right Gait velocity: slow General Gait Details: pt with large amplitude steppage gait with no dorsiflexion noted in right ankle to help clear foot.  Pt with excessive energy expenditure for gait. Stairs: No Wheelchair Mobility Wheelchair Mobility: No    Exercises     PT Diagnosis: Difficulty walking;Abnormality of gait;Generalized weakness;Acute pain  PT Problem List: Decreased strength;Decreased activity tolerance;Decreased balance;Decreased mobility;Decreased knowledge of use of DME;Decreased safety awareness PT Treatment Interventions: DME instruction;Gait training;Functional mobility training;Therapeutic activities;Therapeutic exercise;Balance training;Patient/family education   PT Goals Acute Rehab PT Goals PT Goal Formulation: With patient with good potential to achieve by 10/06/11 Pt will go Supine/Side to Sit: Independently PT Goal: Supine/Side to Sit - Progress: Goal set today Pt will go Sit to Stand: Independently PT Goal: Sit to Stand - Progress: Goal set today Pt will Transfer Bed to Chair/Chair to Bed: Independently PT Transfer Goal: Bed to Chair/Chair to Bed - Progress: Goal set today Pt will Ambulate: 51 - 150 feet;with rolling walker;with min assist PT Goal: Ambulate - Progress: Goal set today  Visit Information  Last PT Received On: 09/22/11 Assistance Needed: +2    Subjective Data      Prior Functioning  Home Living Lives With: Family Available Help at Discharge: Friend(s) Type of Home: House Home Access: Ramped entrance Home Layout: One level Home Adaptive Equipment: Walker - rolling;Wheelchair - manual Additional Comments: Pt states she walks  with a walker inside her home and she uses her wheelchair when she goes  out Prior Function Level of Independence: Independent with assistive device(s) Communication Communication: No difficulties    Cognition  Overall Cognitive Status: Appears within functional limits for tasks assessed/performed Arousal/Alertness: Awake/alert Orientation Level: Appears intact for tasks assessed Behavior During Session: Angelica Lawrence for tasks performed    Extremity/Trunk Assessment Right Lower Extremity Assessment RLE ROM/Strength/Tone: Deficits RLE ROM/Strength/Tone Deficits: pt with generalized muscle weakenss, edema at ankle  PT WITH 0/5 ANKLE DORSIFLEXION THAT SHE REPORTS IS NEW RLE Sensation: WFL - Light Touch RLE Coordination: WFL - gross/fine motor Left Lower Extremity Assessment LLE ROM/Strength/Tone: Deficits LLE ROM/Strength/Tone Deficits: well healed BKA with some callous formation.  Generalized muscle atrophy Trunk Assessment Trunk Assessment: Other exceptions Trunk Exceptions: pt with some obesity and generalized muscle atrophy   Balance High Level Balance High Level Balance Comments: pt had some difficutly gaining balance in standing.  She felt that she had some swellinging in leg and so prosthesis was not fitting as it usually does.  End of Session PT - End of Session Equipment Utilized During Treatment: Gait belt;Left lower extremity prosthesis Activity Tolerance: Patient limited by fatigue;Patient limited by pain;Other (comment) (RN notified) Patient left: in chair Nurse Communication: Mobility status;Patient requests pain meds;Other (comment) (evidence of new finding of right foot drop)  GP     Angelica Lawrence 09/22/2011, 11:29 AM

## 2011-09-22 NOTE — Progress Notes (Signed)
Patient ID: Angelica Lawrence, female   DOB: March 17, 1926, 78 y.o.   MRN: [id number]  [facility] PROGRESS NOTE  Angelica Lawrence [id number] DOB: [birth year] DOA: 09/20/2011 PCP: [name], MD  Brief narrative:  Pt is 78 yo female who was admitted 09/20/2011 with abdominal pain, nausea and vomiting and currently being treated for acute diverticulitis.   Principal Problem:  *Diverticulitis of cecum  - pt reports clinical improvement since admission  - will continue current medication regimen with antibiotic as well as supportive care with antiemetics and analgesia as needed  - will advance the diet as pt is tolerating  - obtain CBC and BMP in AM  Active Problems:  Acute renal failure  - likely secondary to pre renal etiology, dehydration  - creatinine is trending down - will continue IVF  - BMP in AM   Anemia of chronic disease  - Hg and Hct stable and at pt's baseline  - CBC in AM   HYPERLIPIDEMIA  - stable  - continue statin   HYPERTENSION  - will continue to monitor vitals per floor protocol   DM (diabetes mellitus)  - will check A1C  - continue to monitor CBG per floor protocol   Dehydration, mild  - will provide supportive care with IVF and will follow up on electrolyte panel   Consultants:  None  Procedures:  None  Antibiotics:  Unasyn 09/20/2011 -->  HPI/Subjective: No events overnight. Pt reports feeling better.   Objective: Filed Vitals:   09/21/11 1414 09/21/11 2205 09/22/11 0620 09/22/11 1111  BP: 116/66 129/50 148/71   Pulse: 67 72 74 77  Temp: 98.5 F (36.9 C) 98.8 F (37.1 C) 98.9 F (37.2 C)   TempSrc: Oral Oral Oral   Resp: 18 18 18    Height:   5\' 6"  (1.676 m)   Weight:   81.557 kg (179 lb 12.8 oz)   SpO2: 96% 94% 97% 98%    Intake/Output Summary (Last 24 hours) at 09/22/11 1538 Last data filed at 09/22/11 1500  Gross per 24 hour  Intake 5160.83 ml  Output   1050 ml  Net 4110.83 ml    Exam:   General:  Pt is alert, follows  commands appropriately, not in acute distress  Cardiovascular: Regular rate and rhythm, S1/S2, no murmurs, no rubs, no gallops  Respiratory: Clear to auscultation bilaterally, no wheezing, no crackles, no rhonchi  Abdomen: Soft, non tender, non distended, bowel sounds present, no guarding  Extremities: No edema, pulses DP and PT palpable bilaterally  Neuro: Grossly nonfocal  Data Reviewed: Basic Metabolic Panel:  Lab 09/22/11 5284 09/21/11 0435 09/20/11 2000  NA 140 136 134*  K 5.3* 3.8 3.9  CL 114* 106 103  CO2 15* 21 21  GLUCOSE 138* 154* 141*  BUN 15 22 25*  CREATININE 1.35* 1.52* 1.77*  CALCIUM 8.4 8.3* 8.9  MG -- -- --  PHOS -- -- --   Liver Function Tests:  Lab 09/20/11 2000  AST 25  ALT 14  ALKPHOS 71  BILITOT 0.4  PROT 6.5  ALBUMIN 2.8*    Lab 09/20/11 2000  LIPASE 14  AMYLASE --   CBC:  Lab 09/22/11 0525 09/21/11 0435 09/20/11 2000  WBC 6.5 7.2 7.5  HGB 11.3* 10.9* 11.5*  HCT 32.2* 31.7* 33.6*  MCV 87.7 89.3 89.4  PLT PLATELET CLUMPS NOTED  123* 134*   CBG:  Lab 09/22/11 1131 09/22/11 0740 09/21/11 2204 09/21/11 1640 09/21/11 1126  GLUCAP 179* 145* 160* 147*  205*    Recent Results (from the past 240 hour(s))  MRSA PCR SCREENING     Status: Normal   Collection Time   09/21/11 12:24 PM      Component Value Range Status Comment   MRSA by PCR NEGATIVE  NEGATIVE Final      Studies:  Ct Abdomen Pelvis Wo Contrast 09/20/2011   IMPRESSION:  Findings most consistent with acute diverticulitis at the level of the cecum.  No abscess or perforation.  There is stranding in the lower abdominal subcutaneous fat.  Consider cellulitis.   Scheduled Meds:  . ampicillin-sulbactam   1.5 g Intravenous Q6H  . aspirin EC  81 mg Oral Daily  . atenolol  50 mg Oral Daily  . enoxaparin injection  40 mg Subcutaneous Q24H  . insulin aspart  0-15 Units Subcutaneous TID WC  . insulin aspart  0-5 Units Subcutaneous QHS  . isosorbide mononitrate  10 mg Oral Daily    . losartan  100 mg Oral Daily  . pantoprazole  40 mg Oral Q1200  . potassium chloride  10 mEq Oral Daily  . simvastatin  40 mg Oral QHS   Continuous Infusions:   . sodium chloride 1,000 mL (09/22/11 1338)      Code Status: Full Family Communication: Pt at bedside Disposition Plan: Home PT  Debbora Presto, MD  [facility] Pager [phone]  If 7PM-7AM, please contact night-coverage [email or web address] Password [email or web address] 09/22/2011, 3:38 PM   LOS: 2 days

## 2011-09-22 NOTE — Progress Notes (Addendum)
Cm spoke with patient with adult daughter & other family at bedside. MD order for HHPT. Pt offered choice for [facility], Per pt choice with family [facility] to provide [facility] services. Patient request RW. CM notified RN of request to call MD for VO. CM to leave sticky note. No other needs stated. Adult children to assist in home care. PCP Dr.Sean Everardo All.   Leonie Green [phone]

## 2011-09-23 DIAGNOSIS — K5730 Diverticulosis of large intestine without perforation or abscess without bleeding: Secondary | ICD-10-CM

## 2011-09-23 LAB — CBC
MCH: 30.2 pg (ref 26.0–34.0)
MCHC: 34.2 g/dL (ref 30.0–36.0)
MCV: 88.4 fL (ref 78.0–100.0)
Platelets: 144 10*3/uL — ABNORMAL LOW (ref 150–400)
RDW: 13.8 % (ref 11.5–15.5)

## 2011-09-23 LAB — GLUCOSE, CAPILLARY: Glucose-Capillary: 118 mg/dL — ABNORMAL HIGH (ref 70–99)

## 2011-09-23 LAB — BASIC METABOLIC PANEL
BUN: 11 mg/dL (ref 6–23)
CO2: 21 mEq/L (ref 19–32)
Calcium: 8.2 mg/dL — ABNORMAL LOW (ref 8.4–10.5)
Creatinine, Ser: 1.09 mg/dL (ref 0.50–1.10)
Glucose, Bld: 112 mg/dL — ABNORMAL HIGH (ref 70–99)

## 2011-09-23 NOTE — Progress Notes (Signed)
Patient ID: Angelica Lawrence, female   DOB: 1926/05/11, 78 y.o.   MRN: [id number]  [facility] PROGRESS NOTE  CIEL CHERVENAK MWU:[id number] DOB: [birth year] DOA: 09/20/2011 PCP: [name], MD  Brief narrative:  Pt is 78 yo female who was admitted 09/20/2011 with abdominal pain, nausea and vomiting and currently being treated for acute diverticulitis.   Principal Problem:  *Diverticulitis of cecum  - pt reports clinical improvement since admission  - will continue current medication regimen with antibiotic as well as supportive care with antiemetics and analgesia as needed  - will advance the diet as pt is tolerating  - obtain CBC and BMP in AM  Active Problems:  Acute renal failure  - likely secondary to pre renal etiology, dehydration  - creatinine is trending down and is within normal limits this AM - will continue IVF  - BMP in AM   Anemia of chronic disease  - Hg and Hct stable and at pt's baseline  - CBC in AM   HYPERLIPIDEMIA  - stable  - continue statin   HYPERTENSION  - will continue to monitor vitals per floor protocol   DM (diabetes mellitus)  - A1C stable - continue to monitor CBG per floor protocol   Dehydration, mild  - will provide supportive care with IVF and will follow up on electrolyte panel   Consultants:  None  Procedures:  None  Antibiotics:  Unasyn 09/20/2011 -->  HPI/Subjective:  No events overnight. Pt reports feeling better.    Objective: Filed Vitals:   09/22/11 1111 09/22/11 1444 09/22/11 2200 09/23/11 0600  BP:  129/56 147/78 152/96  Pulse: 77 57 77 79  Temp:  98.5 F (36.9 C) 99.3 F (37.4 C) 99.8 F (37.7 C)  TempSrc:  Oral Oral Oral  Resp:  18 20 20   Height:      Weight:      SpO2: 98% 100% 97% 96%    Intake/Output Summary (Last 24 hours) at 09/23/11 1334 Last data filed at 09/22/11 1849  Gross per 24 hour  Intake   1140 ml  Output    400 ml  Net    740 ml    Exam:   General:  Pt is alert, follows  commands appropriately, not in acute distress  Cardiovascular: Regular rate and rhythm, S1/S2, no murmurs, no rubs, no gallops  Respiratory: Clear to auscultation bilaterally, no wheezing, no crackles, no rhonchi  Abdomen: Soft, non tender, non distended, bowel sounds present, no guarding  Extremities: No edema, pulses DP and PT palpable bilaterally  Neuro: Grossly nonfocal  Data Reviewed: Basic Metabolic Panel:  Lab 09/23/11 7253 09/22/11 1810 09/22/11 0525 09/21/11 0435 09/20/11 2000  NA 137 143 140 136 134*  K 3.7 4.0 5.3* 3.8 3.9  CL 109 112 114* 106 103  CO2 21 22 15* 21 21  GLUCOSE 112* 160* 138* 154* 141*  BUN 11 13 15 22  25*  CREATININE 1.09 1.17* 1.35* 1.52* 1.77*  CALCIUM 8.2* 8.3* 8.4 8.3* 8.9  MG -- -- -- -- --  PHOS -- -- -- -- --   Liver Function Tests:  Lab 09/20/11 2000  AST 25  ALT 14  ALKPHOS 71  BILITOT 0.4  PROT 6.5  ALBUMIN 2.8*    Lab 09/20/11 2000  LIPASE 14  AMYLASE --   CBC:  Lab 09/23/11 0620 09/22/11 0525 09/21/11 0435 09/20/11 2000  WBC 5.9 6.5 7.2 7.5  NEUTROABS -- -- -- 5.6  HGB 10.9* 11.3* 10.9* 11.5*  HCT 31.9* 32.2* 31.7* 33.6*  MCV 88.4 87.7 89.3 89.4  PLT 144* PLATELET CLUMPS NOTED ON SMEAR, COUNT APPEARS ADEQUATE 123* 134*   CBG:  Lab 09/23/11 1248 09/23/11 0753 09/22/11 2129 09/22/11 1643 09/22/11 1131  GLUCAP 245* 118* 159* 128* 179*    Recent Results (from the past 240 hour(s))  MRSA PCR SCREENING     Status: Normal   Collection Time   09/21/11 12:24 PM      Component Value Range Status Comment   MRSA by PCR NEGATIVE  NEGATIVE Final      Studies: No results found.  Scheduled Meds:   . ampicillin-sulbactam (UNASYN) IV  1.5 g Intravenous Q6H  . aspirin EC  81 mg Oral Daily  . atenolol  50 mg Oral Daily  . enoxaparin (LOVENOX) injection  40 mg Subcutaneous Q24H  . insulin aspart  0-15 Units Subcutaneous TID WC  . insulin aspart  0-5 Units Subcutaneous QHS  . isosorbide mononitrate  10 mg Oral Daily  .  losartan  100 mg Oral Daily  . pantoprazole  40 mg Oral Q1200  . simvastatin  40 mg Oral QHS  . DISCONTD: potassium chloride  10 mEq Oral Daily   Continuous Infusions:   . sodium chloride 1,000 mL (09/22/11 1338)      Code Status: Full Family Communication: Pt at bedside Disposition Plan: Home in 1-2 days.  Debbora Presto, MD  [facility] Pager [phone]  If 7PM-7AM, please contact night-coverage [email or web address] Password [email or web address] 09/23/2011, 1:34 PM   LOS: 3 days

## 2011-09-24 DIAGNOSIS — D509 Iron deficiency anemia, unspecified: Secondary | ICD-10-CM

## 2011-09-24 LAB — BASIC METABOLIC PANEL
CO2: 23 mEq/L (ref 19–32)
Calcium: 8.4 mg/dL (ref 8.4–10.5)
Creatinine, Ser: 1.18 mg/dL — ABNORMAL HIGH (ref 0.50–1.10)
GFR calc Af Amer: 48 mL/min — ABNORMAL LOW (ref >90)
GFR calc non Af Amer: 41 mL/min — ABNORMAL LOW (ref >90)
Glucose, Bld: 145 mg/dL — ABNORMAL HIGH (ref 70–99)

## 2011-09-24 LAB — CBC
MCH: 30.1 pg (ref 26.0–34.0)
MCHC: 33.9 g/dL (ref 30.0–36.0)
MCV: 88.7 fL (ref 78.0–100.0)
Platelets: 139 10*3/uL — ABNORMAL LOW (ref 150–400)
RBC: 3.46 MIL/uL — ABNORMAL LOW (ref 3.87–5.11)

## 2011-09-24 LAB — GLUCOSE, CAPILLARY
Glucose-Capillary: 149 mg/dL — ABNORMAL HIGH (ref 70–99)
Glucose-Capillary: 123 mg/dL — ABNORMAL HIGH (ref 70–99)

## 2011-09-24 MED ORDER — POTASSIUM CHLORIDE CRYS ER 20 MEQ PO TBCR
40.0000 meq | EXTENDED_RELEASE_TABLET | Freq: Once | ORAL | Status: AC
  Administered 2011-09-24: 40 meq via ORAL
  Filled 2011-09-24: qty 2

## 2011-09-24 MED ORDER — CIPROFLOXACIN HCL 500 MG PO TABS: 500.0000 mg | ORAL_TABLET | Freq: Two times a day (BID) | ORAL | Status: AC

## 2011-09-24 NOTE — Progress Notes (Signed)
Patient evaluated for long-term disease management services with [facility] as benefit of her [facility] insurance.     Raiford Noble, MSN-Ed, RN,BSN [facility] Liaison [phone]

## 2011-09-24 NOTE — Progress Notes (Signed)
Physical Therapy Treatment Patient Details Name: Angelica Lawrence MRN: [id number] DOB: [birth year] Today's Date: 09/24/2011 Time: 4098-1191 PT Time Calculation (min): 14 min  PT Assessment / Plan / Recommendation Comments on Treatment Session  Pt required increased assistance to rise from lower sitting surface this session. Pt still unable to DF R foot and continues to demonstrate steppage gait pattern during ambulation. Pt states her husband (mostly) and children are able to assist at home. Recommend HHPT and 24 hour supervision/assist for OOB/mobility.     Follow Up Recommendations  Home health PT;Supervision for mobility/OOB;Supervision/Assistance - 24 hour    Barriers to Discharge        Equipment Recommendations  None recommended by PT    Recommendations for Other Services OT consult  Frequency Min 3X/week   Plan Discharge plan remains appropriate    Precautions / Restrictions Precautions Precautions: Fall Other Brace/Splint: L BKA prosthesis Restrictions Weight Bearing Restrictions: No   Pertinent Vitals/Pain     Mobility  Bed Mobility Bed Mobility: Not assessed Details for Bed Mobility Assistance: Pt sitting up in recliner at start of session.  Transfers Transfers: Sit to Stand;Stand to Sit Sit to Stand: 3: Mod assist;With upper extremity assist;With armrests;From chair/3-in-1 Stand to Sit: 4: Min assist;To chair/3-in-1;With armrests;With upper extremity assist Details for Transfer Assistance: VCs safety, technique, hand placement. Increased assist to rise from lower surface (recliner). Assist to rise, stabilize, control descent.  Ambulation/Gait Ambulation/Gait Assistance: 3: Mod assist Ambulation Distance (Feet): 35 Feet Assistive device: Rolling walker Ambulation/Gait Assistance Details: VCs safety, technique, posture. Assist to stabilize throughout gait. Still note that pt unable to DF R foot. Fatigues easily. Followed with recliner.  Gait Pattern: Right  steppage;Step-to pattern;Decreased stride length;Decreased step length - right;Decreased step length - left;Decreased dorsiflexion - right    Exercises     PT Diagnosis:    PT Problem List:   PT Treatment Interventions:     PT Goals Acute Rehab PT Goals PT Goal: Sit to Stand - Progress: Progressing toward goal PT Transfer Goal: Bed to Chair/Chair to Bed - Progress: Progressing toward goal PT Goal: Ambulate - Progress: Progressing toward goal  Visit Information  Last PT Received On: 09/24/11 Assistance Needed: +2 (safety, follow with chair)    Subjective Data  Subjective: "Im supposed to go home today" Patient Stated Goal: Walk by myself   Cognition  Overall Cognitive Status: Appears within functional limits for tasks assessed/performed Arousal/Alertness: Awake/alert Orientation Level: Appears intact for tasks assessed Behavior During Session: [facility] for tasks performed    Balance  Balance Balance Assessed: Yes Static Standing Balance Static Standing - Balance Support: Bilateral upper extremity supported Static Standing - Level of Assistance: 4: Min assist Dynamic Standing Balance Dynamic Standing - Balance Support: Bilateral upper extremity supported Dynamic Standing - Level of Assistance: 3: Mod assist  End of Session PT - End of Session Equipment Utilized During Treatment: Gait belt;Left lower extremity prosthesis Activity Tolerance: Patient limited by fatigue Patient left: in chair;with call bell/phone within reach   GP     Rebeca Alert [facility] 09/24/2011, 10:54 AM [phone]

## 2011-09-24 NOTE — Progress Notes (Signed)
Discharge teaching given. Pt. IV was removed. Patient discharged to home with family

## 2011-09-24 NOTE — Discharge Summary (Signed)
Physician Discharge Summary  Angelica Lawrence JWJ:[id number] DOB: 02-10-1927 DOA: 09/20/2011  PCP: [name], MD  Admit date: 09/20/2011 Discharge date: 09/24/2011  Recommendations for Outpatient Follow-up:  1. Please note that pt will need to see PCP in 2-3 weeks post discharge 2. Pt will need to have BMP checked to ensure that creatinine remains stable and at her baseline and also to ensure that potassium is within normal limits 3. Please note that pt had K = 3.4 on day of discharge and was given one extra dose of K-DUR 40 MEQ before leaving the hospital 09/24/2011 4. Please note that pt was also sent home on antibiotic Ciprofloxacin 500 mg BID to complete the therapy for 10 additional days  Discharge Diagnoses: Abdominal pain secondary to diverticulitis of the cecum  Principal Problem:  *Diverticulitis of cecum Active Problems:  HYPERLIPIDEMIA  HYPERTENSION  PERIPHERAL VASCULAR DISEASE  DIVERTICULOSIS, COLON  BKA, LEFT LEG  DM (diabetes mellitus)  Dehydration, mild  Discharge Condition: Stable  Diet recommendation: Regular diet as pt tolerating   History of present illness:  Angelica Lawrence is very pleasant 78 y.o. female with history of diverticulosis, reportedly last colonoscopy about 2 years ago, history of hypertension, hyperlipidemia, peripheral vascular disease status post left AKA, diabetes type 1, status post cholecystectomy and hysterectomy, who presented to emergency room with two-day history of right-sided abdominal pain, mild watery diarrhea, and nausea with no vomiting. She denied any shortness of breath, chest pain, fever or chills, black stool, bloody stool, dysuria, polyuria, or any back pain. Evaluation in emergency room included an abdominal/pelvic CT which showed evidence of cecum diverticulitis with no abscess.   Hospital Course:  Principal Problem:  *Diverticulitis of cecum  - pt reports feeling back to her baseline - she was initially admitted to telemetry  floor and was started on antibiotic Unasyn - she was also given IVF and provided supportive care with analgesia, antiemetics as needed - she has responded weill to the regimen and has reported resolution of her symptoms - diet was advanced and pt has been tolerating regular diet well - she will continue therapy with Ciprofloxacin in an outpatient setting for 10 additional days  Active Problems:  Acute renal failure  - likely secondary to pre renal etiology, dehydration  - creatinine is trending down and is at pt's baseline this AM - pt will need to have BMP checked upon follow up with PCP  Anemia of chronic disease  - Hg and Hct stable and at pt's baseline   HYPERLIPIDEMIA  - stable  - continue statin   HYPERTENSION  - stable during the hospital stay   DM (diabetes mellitus)  - A1C stable, 6.0   Dehydration, mild  - we provided supportive care with IVF   Consultants:  None  Procedures:  None  Antibiotics:  Unasyn 09/20/2011 --> 09/24/2011 Ciprofloxacin 500 mg BID 09/24/2011 --> will need 10 days therapy  Discharge Exam: Filed Vitals:   09/24/11 0600  BP: 159/85  Pulse: 72  Temp: 99.4 F (37.4 C)  Resp: 18   Filed Vitals:   09/23/11 0600 09/23/11 1355 09/23/11 2200 09/24/11 0600  BP: 152/96 148/70 158/79 159/85  Pulse: 79 67 67 72  Temp: 99.8 F (37.7 C) 98.6 F (37 C) 98.9 F (37.2 C) 99.4 F (37.4 C)  TempSrc: Oral Oral Oral Oral  Resp: 20 18 18 18   Height:      Weight:      SpO2: 96% 98% 100% 96%  General: Pt is alert, follows commands appropriately, not in acute distress Cardiovascular: Regular rate and rhythm, S1/S2 +, no murmurs, no rubs, no gallops Respiratory: Clear to auscultation bilaterally, no wheezing, no crackles, no rhonchi Abdominal: Soft, non tender, non distended, bowel sounds +, no guarding Extremities: left BKA, no edema Neuro: Grossly nonfocal  Discharge Instructions  Discharge Orders    Future Appointments: Provider:  Department: Dept Phone: Center:   11/23/2011 10:45 AM Pricilla Riffle, MD [facility] [phone] [facility]   12/14/2011 9:00 AM [name], MD [facility] [phone] [facility]     Future Orders Please Complete By Expires   Diet - low sodium heart healthy      Increase activity slowly        Medication List  As of 09/24/2011 10:05 PM   TAKE these medications         aspirin 81 MG tablet   Take 81 mg by mouth daily.      atenolol 25 MG tablet   Commonly known as: TENORMIN   TAKE 1 TABLET BY MOUTH EVERY DAY      cholestyramine 4 GM/DOSE powder   Commonly known as: QUESTRAN   USE 1 SCOOP ONCE DAILY WITH 2 OZ OF WATER      ciprofloxacin 500 MG tablet   Commonly known as: CIPRO   Take 1 tablet (500 mg total) by mouth 2 (two) times daily.      [employer] IRON 325 (65 FE) MG tablet   Generic drug: ferrous sulfate   Take 325 mg by mouth daily.      furosemide 40 MG tablet   Commonly known as: LASIX   TAKE 1 TABLET BY MOUTH EVERY DAY      insulin aspart 100 UNIT/ML injection   Commonly known as: novoLOG   Inject 5 Units into the skin 3 (three) times daily before meals.      Insulin Pen Needle 31G X 8 MM Misc   1 each by Does not apply route daily.      isosorbide mononitrate 10 MG tablet   Commonly known as: ISMO,MONOKET   TAKE 1 TABLET EVERY DAY      losartan-hydrochlorothiazide 100-25 MG per tablet   Commonly known as: HYZAAR   TAKE 1 TABLET EVERY DAY      omeprazole 20 MG capsule   Commonly known as: PRILOSEC   TAKE ONE CAPSULE BY MOUTH EVERY MORNING      potassium chloride 10 MEQ tablet   Commonly known as: K-DUR   Take 1 tablet (10 mEq total) by mouth daily.      simvastatin 40 MG tablet   Commonly known as: ZOCOR   Take 1 tablet (40 mg total) by mouth at bedtime.      traZODone 100 MG tablet   Commonly known as: DESYREL   TAKE 1 TABLET BY MOUTH EVERY DAY AT BEDTIME           Follow-up Information    Follow up with [name], MD in 2 weeks.   Contact  information:   [address] [facility] [address]th [address] [place] [address] [phone]           The results of significant diagnostics from this hospitalization (including imaging, microbiology, ancillary and laboratory) are listed below for reference.    Significant Diagnostic Studies:  Ct Abdomen Pelvis Wo Contrast 09/20/2011    IMPRESSION:  Findings most consistent with acute diverticulitis at the level of the cecum.   No abscess  or perforation.   There is stranding in the lower abdominal subcutaneous fat.    Microbiology: Recent Results (from the past 240 hour(s))  MRSA PCR SCREENING     Status: Normal   Collection Time   09/21/11 12:24 PM      Component Value Range Status Comment   MRSA by PCR NEGATIVE  NEGATIVE Final      Labs: Basic Metabolic Panel:  Lab 09/24/11 8657 09/23/11 0620 09/22/11 1810 09/22/11 0525 09/21/11 0435  NA 140 137 143 140 136  K 3.4* 3.7 4.0 5.3* 3.8  CL 111 109 112 114* 106  CO2 23 21 22  15* 21  GLUCOSE 145* 112* 160* 138* 154*  BUN 9 11 13 15 22   CREATININE 1.18* 1.09 1.17* 1.35* 1.52*  CALCIUM 8.4 8.2* 8.3* 8.4 8.3*  MG -- -- -- -- --  PHOS -- -- -- -- --   Liver Function Tests:  Lab 09/20/11 2000  AST 25  ALT 14  ALKPHOS 71  BILITOT 0.4  PROT 6.5  ALBUMIN 2.8*    Lab 09/20/11 2000  LIPASE 14  AMYLASE --   CBC:  Lab 09/24/11 0520 09/23/11 0620 09/21/11 0435 09/20/11 2000  WBC 5.3 5.9 7.2 7.5  NEUTROABS -- -- -- 5.6  HGB 10.4* 10.9* 10.9* 11.5*  HCT 30.7* 31.9* 31.7* 33.6*  MCV 88.7 88.4 89.3 89.4  PLT 139* 144* 123* 134*   CBG:  Lab 09/24/11 1124 09/24/11 0745 09/23/11 2209 09/23/11 1647 09/23/11 1248  GLUCAP 265* 123* 149* 173* 245*    Time coordinating discharge: Over 30 minutes  Signed:  Debbora Presto, MD  [facility] 09/24/2011, 10:05 PM Pager [phone]  If 7PM-7AM, please contact night-coverage [email or web address] Password [email or web address]

## 2011-10-08 ENCOUNTER — Ambulatory Visit (INDEPENDENT_AMBULATORY_CARE_PROVIDER_SITE_OTHER): Payer: Medicare Other | Admitting: Endocrinology

## 2011-10-08 ENCOUNTER — Encounter: Payer: Self-pay | Admitting: Endocrinology

## 2011-10-08 ENCOUNTER — Other Ambulatory Visit (INDEPENDENT_AMBULATORY_CARE_PROVIDER_SITE_OTHER): Payer: Medicare Other

## 2011-10-08 VITALS — BP 126/78 | HR 66 | Temp 98.9°F

## 2011-10-08 DIAGNOSIS — E1029 Type 1 diabetes mellitus with other diabetic kidney complication: Secondary | ICD-10-CM

## 2011-10-08 DIAGNOSIS — N058 Unspecified nephritic syndrome with other morphologic changes: Secondary | ICD-10-CM

## 2011-10-08 DIAGNOSIS — D509 Iron deficiency anemia, unspecified: Secondary | ICD-10-CM

## 2011-10-08 LAB — BASIC METABOLIC PANEL
BUN: 13 mg/dL (ref 6–23)
CO2: 24 mEq/L (ref 19–32)
Calcium: 9.1 mg/dL (ref 8.4–10.5)
Chloride: 106 mEq/L (ref 96–112)
Creatinine, Ser: 1.1 mg/dL (ref 0.4–1.2)
Glucose, Bld: 115 mg/dL — ABNORMAL HIGH (ref 70–99)

## 2011-10-08 LAB — CBC WITH DIFFERENTIAL/PLATELET
Basophils Absolute: 0 10*3/uL (ref 0.0–0.1)
Eosinophils Absolute: 0.1 10*3/uL (ref 0.0–0.7)
Lymphocytes Relative: 19.8 % (ref 12.0–46.0)
MCHC: 33.3 g/dL (ref 30.0–36.0)
MCV: 93.3 fl (ref 78.0–100.0)
Monocytes Absolute: 0.5 10*3/uL (ref 0.1–1.0)
Neutrophils Relative %: 69.8 % (ref 43.0–77.0)
Platelets: 204 10*3/uL (ref 150.0–400.0)
RBC: 3.75 Mil/uL — ABNORMAL LOW (ref 3.87–5.11)
RDW: 14.9 % — ABNORMAL HIGH (ref 11.5–14.6)

## 2011-10-08 LAB — IBC PANEL
Saturation Ratios: 36.9 % (ref 20.0–50.0)
Transferrin: 156.8 mg/dL — ABNORMAL LOW (ref 212.0–360.0)

## 2011-10-08 NOTE — Progress Notes (Signed)
Subjective:    Patient ID: Angelica Lawrence, female    DOB: Feb 22, 1927, 78 y.o.   MRN: [id number]  HPI The state of at least three ongoing medical problems is addressed today: Renal failure:  Denies dysuria Diverticulitis:  abd pain is much less now, but constipation persists.     Hypokalemia: denies leg cramps Anemia:  She take fe tabs 2/day. Past Medical History  Diagnosis Date  . ALLERGIC RHINITIS 12/30/2006  . ANEMIA, IRON DEFICIENCY 10/21/2009  . COLONIC POLYPS, HX OF 12/30/2006  . CORONARY ARTERY DISEASE 12/30/2006  . DIABETES MELLITUS, TYPE I 12/30/2006  . DIVERTICULOSIS, COLON 12/30/2006  . HYPERLIPIDEMIA 12/30/2006  . HYPERTENSION 12/30/2006  . PERIPHERAL NEUROPATHY 12/30/2006  . PERIPHERAL VASCULAR DISEASE 12/30/2006  . GOITER 12/30/2006  . VITAMIN B12 DEFICIENCY 09/16/2008  . Carpal tunnel syndrome 12/30/2006  . CATARACTS, BILATERAL 12/30/2006  . HEMORRHOIDS 12/30/2006  . DEGENERATIVE JOINT DISEASE 02/10/2007  . INSOMNIA 10/21/2009  . CORNEAL TRANSPLANT 12/30/2006  . BKA, LEFT LEG 12/30/2006    Past Surgical History  Procedure Date  . Appendectomy   . Cholecystectomy   . Abdominal hysterectomy 1960's    and bso  . Below knee leg amputation 1989    L    History   Social History  . Marital Status: Widowed    Spouse Name: N/A    Number of Children: N/A  . Years of Education: N/A   Occupational History  . Retired    Social History Main Topics  . Smoking status: Never Smoker   . Smokeless tobacco: Not on file  . Alcohol Use: No  . Drug Use: No  . Sexually Active: Not on file   Other Topics Concern  . Not on file   Social History Narrative  . No narrative on file    Current Outpatient Prescriptions on File Prior to Visit  Medication Sig Dispense Refill  . aspirin 81 MG tablet Take 81 mg by mouth daily.        Marland Kitchen atenolol (TENORMIN) 25 MG tablet TAKE 1 TABLET BY MOUTH EVERY DAY  30 tablet  5  . cholestyramine (QUESTRAN) 4 GM/DOSE powder USE 1 SCOOP  ONCE DAILY WITH 2 OZ OF WATER  378 g  3  . ferrous sulfate ([employer] IRON) 325 (65 FE) MG tablet Take 325 mg by mouth daily.        . furosemide (LASIX) 40 MG tablet TAKE 1 TABLET BY MOUTH EVERY DAY  30 tablet  8  . insulin aspart (NOVOLOG) 100 UNIT/ML injection Inject 5 Units into the skin 3 (three) times daily before meals.      . Insulin Pen Needle 31G X 8 MM MISC 1 each by Does not apply route daily.  100 each  1  . isosorbide mononitrate (ISMO,MONOKET) 10 MG tablet TAKE 1 TABLET EVERY DAY  30 tablet  5  . Linaclotide (LINZESS) 145 MCG CAPS Take 145 mcg by mouth daily.      Marland Kitchen losartan-hydrochlorothiazide (HYZAAR) 100-25 MG per tablet TAKE 1 TABLET EVERY DAY  30 tablet  9  . omeprazole (PRILOSEC) 20 MG capsule TAKE ONE CAPSULE BY MOUTH EVERY MORNING  30 capsule  5  . potassium chloride (K-DUR) 10 MEQ tablet Take 1 tablet (10 mEq total) by mouth daily.  30 tablet  4  . simvastatin (ZOCOR) 40 MG tablet Take 1 tablet (40 mg total) by mouth at bedtime.  30 tablet  11  . traZODone (DESYREL) 100 MG tablet TAKE 1 TABLET  BY MOUTH EVERY DAY AT BEDTIME  30 tablet  5   No Known Allergies  Family History  Problem Relation Age of Onset  . Cancer Neg Hx   . Heart disease Neg Hx    BP 126/78  Pulse 66  Temp 98.9 F (37.2 C) (Oral)  SpO2 97%  Review of Systems Denies brbpr and hematuria    Objective:   Physical Exam VITAL SIGNS:  See vs page GENERAL: no distress ABDOMEN: abdomen is soft, nontender.  no hepatosplenomegaly.  not distended.  no hernia   Lab Results  Component Value Date   WBC 5.7 10/08/2011   HGB 11.6* 10/08/2011   HCT 35.0* 10/08/2011   PLT 204.0 10/08/2011   GLUCOSE 115* 10/08/2011   CHOL 106 10/13/2010   TRIG 76.0 10/13/2010   HDL 41.50 10/13/2010   LDLCALC 49 10/13/2010   ALT 14 09/20/2011   AST 25 09/20/2011   NA 138 10/08/2011   K 3.9 10/08/2011   CL 106 10/08/2011   CREATININE 1.1 10/08/2011   BUN 13 10/08/2011   CO2 24 10/08/2011   TSH 1.71 10/13/2010   HGBA1C 6.0* 09/20/2011    MICROALBUR 1.0 10/13/2010      Assessment & Plan:  Renal insuff, improved Anemia, improved Hypokalemia, resolved with rx Diverticulitis, improved, but she has constipation.

## 2011-10-08 NOTE — Patient Instructions (Addendum)
blood tests are being requested for you today.  You will receive a letter with results. Here are some samples of "linzess."  Take 1 per day. Please come back as scheduled for your regular physical in October.

## 2011-10-10 ENCOUNTER — Encounter: Payer: Self-pay | Admitting: Gastroenterology

## 2011-10-20 ENCOUNTER — Other Ambulatory Visit: Payer: Self-pay | Admitting: Endocrinology

## 2011-10-22 ENCOUNTER — Other Ambulatory Visit: Payer: Self-pay | Admitting: Endocrinology

## 2011-11-23 ENCOUNTER — Encounter: Payer: Self-pay | Admitting: Internal Medicine

## 2011-11-23 ENCOUNTER — Ambulatory Visit (INDEPENDENT_AMBULATORY_CARE_PROVIDER_SITE_OTHER): Payer: Medicare Other | Admitting: Internal Medicine

## 2011-11-23 VITALS — BP 108/55 | HR 65 | Ht 67.0 in | Wt 174.0 lb

## 2011-11-23 DIAGNOSIS — I25810 Atherosclerosis of coronary artery bypass graft(s) without angina pectoris: Secondary | ICD-10-CM

## 2011-11-23 DIAGNOSIS — I2510 Atherosclerotic heart disease of native coronary artery without angina pectoris: Secondary | ICD-10-CM

## 2011-11-23 NOTE — Progress Notes (Signed)
HPI Patient is an 78 year old with a history of CAD (CABG in 2003)  I saw her in Sept 2012.  Also HL, venous stasis, HTN, normal LV function. She was hospitalized earlier this year with diverticulitis. She denies CP  Breathing is OK though she is not that actvie. Sleeping OK  No Known Allergies  Current Outpatient Prescriptions  Medication Sig Dispense Refill  . aspirin 81 MG tablet Take 81 mg by mouth daily.        Marland Kitchen atenolol (TENORMIN) 25 MG tablet TAKE 1 TABLET BY MOUTH EVERY DAY  30 tablet  5  . cholestyramine (QUESTRAN) 4 GM/DOSE powder USE 1 SCOOP ONCE DAILY WITH 2 OZ OF WATER  378 g  3  . ferrous sulfate ([facility] IRON) 325 (65 FE) MG tablet Take 325 mg by mouth daily.        . furosemide (LASIX) 40 MG tablet TAKE 1 TABLET BY MOUTH EVERY DAY  30 tablet  5  . insulin aspart (NOVOLOG) 100 UNIT/ML injection Inject 5 Units into the skin 3 (three) times daily before meals.      . Insulin Pen Needle 31G X 8 MM MISC 1 each by Does not apply route daily.  100 each  1  . isosorbide mononitrate (ISMO,MONOKET) 10 MG tablet TAKE 1 TABLET EVERY DAY  30 tablet  5  . Linaclotide (LINZESS) 145 MCG CAPS Take 145 mcg by mouth daily.      Marland Kitchen losartan-hydrochlorothiazide (HYZAAR) 100-25 MG per tablet TAKE 1 TABLET EVERY DAY  30 tablet  5  . omeprazole (PRILOSEC) 20 MG capsule TAKE ONE CAPSULE BY MOUTH EVERY MORNING  30 capsule  5  . potassium chloride (K-DUR) 10 MEQ tablet Take 1 tablet (10 mEq total) by mouth daily.  30 tablet  4  . simvastatin (ZOCOR) 40 MG tablet TAKE 1 TABLET BY MOUTH AT BEDTIME.  30 tablet  5  . traZODone (DESYREL) 100 MG tablet TAKE 1 TABLET BY MOUTH EVERY DAY AT BEDTIME  30 tablet  5  . KLOR-CON M10 10 MEQ tablet 1 tab daily        Past Medical History  Diagnosis Date  . ALLERGIC RHINITIS 12/30/2006  . ANEMIA, IRON DEFICIENCY 10/21/2009  . COLONIC POLYPS, HX OF 12/30/2006  . CORONARY ARTERY DISEASE 12/30/2006  . DIABETES MELLITUS, TYPE I 12/30/2006  . DIVERTICULOSIS, COLON  12/30/2006  . HYPERLIPIDEMIA 12/30/2006  . HYPERTENSION 12/30/2006  . PERIPHERAL NEUROPATHY 12/30/2006  . PERIPHERAL VASCULAR DISEASE 12/30/2006  . GOITER 12/30/2006  . VITAMIN B12 DEFICIENCY 09/16/2008  . Carpal tunnel syndrome 12/30/2006  . CATARACTS, BILATERAL 12/30/2006  . HEMORRHOIDS 12/30/2006  . DEGENERATIVE JOINT DISEASE 02/10/2007  . INSOMNIA 10/21/2009  . CORNEAL TRANSPLANT 12/30/2006  . BKA, LEFT LEG 12/30/2006    Past Surgical History  Procedure Date  . Appendectomy   . Cholecystectomy   . Abdominal hysterectomy 1960's    and bso  . Below knee leg amputation 1989    L    Family History  Problem Relation Age of Onset  . Cancer Neg Hx   . Heart disease Neg Hx     History   Social History  . Marital Status: Widowed    Spouse Name: N/A    Number of Children: N/A  . Years of Education: N/A   Occupational History  . Retired    Social History Main Topics  . Smoking status: Never Smoker   . Smokeless tobacco: Not on file  . Alcohol Use:  No  . Drug Use: No  . Sexually Active: Not on file   Other Topics Concern  . Not on file   Social History Narrative  . No narrative on file    Review of Systems:  All systems reviewed.  They are negative to the above problem except as previously stated.  Vital Signs: BP 108/55  Pulse 65  Ht 5\' 7"  (1.702 m)  Wt 174 lb (78.926 kg)  BMI 27.25 kg/m2  Physical Exam Patient is in NAD  Examined in wheelchair HEENT:  Normocephalic, atraumatic. EOMI, PERRLA.  Neck: JVP is normal.   Lungs: clear to auscultation. No rales no wheezes.  Heart: Regular rate and rhythm. Normal S1, S2. No S3.   GrII/VIsystolic murmur PMI not displaced.  Abdomen:  Supple, nontender. Normal bowel sounds. No obvious masses.   Extremities:   L leg prosthesis.  R leg with no signif edema. Neuro:   alert and oriented x3.  CN II-XII grossly intact.  EKG>  SR.  65 bpm.  RBBB.  LAFB. Assessment and Plan:  1.  CAD.  S/P CABG.  Continue medical Rx   No symptoms to suggest ischemia.    2.  HL  Keep on statin.  Good control  3.  HTN  GOod control    Patient due for labs soon.

## 2011-11-23 NOTE — Patient Instructions (Addendum)
Your physician recommends that you continue on your current medications as directed. Please refer to the Current Medication list given to you today.   Your physician wants you to follow-up in: One year with Dr. Tenny Craw.  You will receive a reminder letter in the mail two months in advance. If you don't receive a letter, please call our office to schedule the follow-up appointment.   Please send our office labs from Dr. Everardo All

## 2011-12-14 ENCOUNTER — Ambulatory Visit (INDEPENDENT_AMBULATORY_CARE_PROVIDER_SITE_OTHER): Payer: Medicare Other | Admitting: Endocrinology

## 2011-12-14 ENCOUNTER — Encounter: Payer: Self-pay | Admitting: Endocrinology

## 2011-12-14 VITALS — BP 130/72 | HR 76 | Temp 98.0°F

## 2011-12-14 DIAGNOSIS — I10 Essential (primary) hypertension: Secondary | ICD-10-CM

## 2011-12-14 DIAGNOSIS — E119 Type 2 diabetes mellitus without complications: Secondary | ICD-10-CM

## 2011-12-14 DIAGNOSIS — I6529 Occlusion and stenosis of unspecified carotid artery: Secondary | ICD-10-CM

## 2011-12-14 DIAGNOSIS — E049 Nontoxic goiter, unspecified: Secondary | ICD-10-CM

## 2011-12-14 DIAGNOSIS — G609 Hereditary and idiopathic neuropathy, unspecified: Secondary | ICD-10-CM

## 2011-12-14 DIAGNOSIS — K769 Liver disease, unspecified: Secondary | ICD-10-CM

## 2011-12-14 DIAGNOSIS — Z0000 Encounter for general adult medical examination without abnormal findings: Secondary | ICD-10-CM

## 2011-12-14 DIAGNOSIS — D509 Iron deficiency anemia, unspecified: Secondary | ICD-10-CM

## 2011-12-14 DIAGNOSIS — E785 Hyperlipidemia, unspecified: Secondary | ICD-10-CM

## 2011-12-14 LAB — CBC WITH DIFFERENTIAL/PLATELET
Basophils Absolute: 0 10*3/uL (ref 0.0–0.1)
Basophils Relative: 0 % (ref 0–1)
Eosinophils Absolute: 0.3 10*3/uL (ref 0.0–0.7)
Hemoglobin: 12.1 g/dL (ref 12.0–15.0)
MCH: 29.9 pg (ref 26.0–34.0)
MCHC: 32.4 g/dL (ref 30.0–36.0)
Neutro Abs: 4.9 10*3/uL (ref 1.7–7.7)
Neutrophils Relative %: 69 % (ref 43–77)
Platelets: 174 10*3/uL (ref 150–400)

## 2011-12-14 LAB — HEPATIC FUNCTION PANEL
ALT: 12 U/L (ref 0–35)
Bilirubin, Direct: 0.1 mg/dL (ref 0.0–0.3)
Indirect Bilirubin: 0.3 mg/dL (ref 0.0–0.9)
Total Protein: 6.3 g/dL (ref 6.0–8.3)

## 2011-12-14 LAB — IBC PANEL
%SAT: 78 % — ABNORMAL HIGH (ref 20–55)
TIBC: 195 ug/dL — ABNORMAL LOW (ref 250–470)
UIBC: 43 ug/dL — ABNORMAL LOW (ref 125–400)

## 2011-12-14 LAB — HEMOGLOBIN A1C
Hgb A1c MFr Bld: 6.2 % — ABNORMAL HIGH (ref <5.7)
Mean Plasma Glucose: 131 mg/dL — ABNORMAL HIGH (ref <117)

## 2011-12-14 LAB — LIPID PANEL
Cholesterol: 129 mg/dL (ref 0–200)
Total CHOL/HDL Ratio: 2.9 Ratio

## 2011-12-14 LAB — BASIC METABOLIC PANEL
BUN: 25 mg/dL — ABNORMAL HIGH (ref 6–23)
Potassium: 4.4 mEq/L (ref 3.5–5.3)

## 2011-12-14 LAB — TSH: TSH: 2.015 u[IU]/mL (ref 0.350–4.500)

## 2011-12-14 NOTE — Patient Instructions (Addendum)
blood tests are being requested for you today.  You will be contacted with results. please consider these measures for your health:  minimize alcohol.  do not use tobacco products.  have a colonoscopy at least every 10 years from age 76.  Women should have an annual mammogram from age 48.  keep firearms safely stored.  always use seat belts.  have working smoke alarms in your home.  see an eye doctor and dentist regularly.  never drive under the influence of alcohol or drugs (including prescription drugs).  please let me know what your wishes would be, if artificial life support measures should become necessary.  it is critically important to prevent falling down (keep floor areas well-lit, dry, and free of loose objects.  If you have a cane, walker, or wheelchair, you should use it, even for short trips around the house.  Also, try not to rush). Please come back for a follow-up appointment in 3 months.   (update: we discussed code status.  pt requests full code, but would not want to be started or maintained on artificial life-support measures if there was not a reasonable chance of recovery)

## 2011-12-14 NOTE — Progress Notes (Signed)
Subjective:    Patient ID: Angelica Lawrence, female    DOB: 05-Nov-1926, 78 y.o.   MRN: [id number]  HPI here for regular wellness examination.  He's feeling pretty well in general, and says chronic med probs are stable, except as noted below Past Medical History  Diagnosis Date  . ALLERGIC RHINITIS 12/30/2006  . ANEMIA, IRON DEFICIENCY 10/21/2009  . COLONIC POLYPS, HX OF 12/30/2006  . CORONARY ARTERY DISEASE 12/30/2006  . DIABETES MELLITUS, TYPE I 12/30/2006  . DIVERTICULOSIS, COLON 12/30/2006  . HYPERLIPIDEMIA 12/30/2006  . HYPERTENSION 12/30/2006  . PERIPHERAL NEUROPATHY 12/30/2006  . PERIPHERAL VASCULAR DISEASE 12/30/2006  . GOITER 12/30/2006  . VITAMIN B12 DEFICIENCY 09/16/2008  . Carpal tunnel syndrome 12/30/2006  . CATARACTS, BILATERAL 12/30/2006  . HEMORRHOIDS 12/30/2006  . DEGENERATIVE JOINT DISEASE 02/10/2007  . INSOMNIA 10/21/2009  . CORNEAL TRANSPLANT 12/30/2006  . BKA, LEFT LEG 12/30/2006    Past Surgical History  Procedure Date  . Appendectomy   . Cholecystectomy   . Abdominal hysterectomy 1960's    and bso  . Below knee leg amputation 1989    L    History   Social History  . Marital Status: Widowed    Spouse Name: N/A    Number of Children: N/A  . Years of Education: N/A   Occupational History  . Retired    Social History Main Topics  . Smoking status: Never Smoker   . Smokeless tobacco: Not on file  . Alcohol Use: No  . Drug Use: No  . Sexually Active: Not on file   Other Topics Concern  . Not on file   Social History Narrative  . No narrative on file    Current Outpatient Prescriptions on File Prior to Visit  Medication Sig Dispense Refill  . aspirin 81 MG tablet Take 81 mg by mouth daily.        Marland Kitchen atenolol (TENORMIN) 25 MG tablet TAKE 1 TABLET BY MOUTH EVERY DAY  30 tablet  5  . cholestyramine (QUESTRAN) 4 GM/DOSE powder USE 1 SCOOP ONCE DAILY WITH 2 OZ OF WATER  378 g  3  . ferrous sulfate ([facility] IRON) 325 (65 FE) MG tablet Take 325 mg by  mouth daily.        . furosemide (LASIX) 40 MG tablet TAKE 1 TABLET BY MOUTH EVERY DAY  30 tablet  5  . insulin aspart (NOVOLOG) 100 UNIT/ML injection Inject 5 Units into the skin 3 (three) times daily before meals.      . Insulin Pen Needle 31G X 8 MM MISC 1 each by Does not apply route daily.  100 each  1  . isosorbide mononitrate (ISMO,MONOKET) 10 MG tablet TAKE 1 TABLET EVERY DAY  30 tablet  5  . KLOR-CON M10 10 MEQ tablet 1 tab daily      . Linaclotide (LINZESS) 145 MCG CAPS Take 145 mcg by mouth daily.      Marland Kitchen losartan-hydrochlorothiazide (HYZAAR) 100-25 MG per tablet TAKE 1 TABLET EVERY DAY  30 tablet  5  . omeprazole (PRILOSEC) 20 MG capsule TAKE ONE CAPSULE BY MOUTH EVERY MORNING  30 capsule  5  . potassium chloride (K-DUR) 10 MEQ tablet Take 1 tablet (10 mEq total) by mouth daily.  30 tablet  4  . simvastatin (ZOCOR) 40 MG tablet TAKE 1 TABLET BY MOUTH AT BEDTIME.  30 tablet  5  . traZODone (DESYREL) 100 MG tablet TAKE 1 TABLET BY MOUTH EVERY DAY AT BEDTIME  30 tablet  5    No Known Allergies  Family History  Problem Relation Age of Onset  . Cancer Neg Hx   . Heart disease Neg Hx     BP 130/72  Pulse 76  Temp 98 F (36.7 C) (Oral)  Review of Systems  Constitutional:       She has lost a few lbs, due to her efforts  HENT: Negative for hearing loss.   Eyes: Negative for visual disturbance.  Respiratory: Negative for shortness of breath.   Gastrointestinal: Negative for anal bleeding.  Genitourinary: Negative for vaginal bleeding.  Musculoskeletal: Negative for back pain.  Skin: Negative for rash.  Neurological: Negative for headaches.  Psychiatric/Behavioral: Negative for dysphoric mood.       Objective:   Physical Exam VS: see vs page GEN: no distress.  In wheelchair HEAD: head: no deformity eyes: no periorbital swelling, no proptosis external nose and ears are normal mouth: no lesion seen NECK: supple, thyroid is not enlarged CHEST WALL: no  deformity LUNGS:  Clear to auscultation BREASTS:  sees gyn CV: reg rate and rhythm, no murmur ABD: abdomen is soft, nontender.  no hepatosplenomegaly.  not distended.  no hernia GENITALIA/RECTAL: sees gyn.   MUSCULOSKELETAL: muscle bulk and strength are grossly normal on the RUE and RLE, but severely decreased on the left.  no obvious joint swelling.   PULSES: dorsalis pedis intact on the right foot, but decreased from normal.  NEURO:  cn 2-12 grossly intact.   readily moves all 4's.   sensation is intact to touch on the right foot.  SKIN:  Normal texture and temperature.  No rash or suspicious lesion is visible.   NODES:  None palpable at the neck PSYCH: alert, oriented x3.  Does not appear anxious nor depressed.       Assessment & Plan:  Wellness visit today, with problems stable, except as noted.    SEPARATE EVALUATION FOLLOWS--EACH PROBLEM HERE IS NEW, NOT RESPONDING TO TREATMENT, OR POSES SIGNIFICANT RISK TO THE PATIENT'S HEALTH: HISTORY OF THE PRESENT ILLNESS: The state of at least three ongoing medical problems is addressed today: Pt returns for f/u of IDDM (dx'ed 1965; complicated by peripheral sensory neuropathy, CVA, CAD, background retinopathy, left leg BKA, proteinuria, and PAD).  no cbg record, but states cbg's are well-controlled fe-deficiency anemia: she takes fe tabs.  Denies hematuria Renal insuff: denies LOC PAST MEDICAL HISTORY reviewed and up to date today REVIEW OF SYSTEMS: Denies chest pain and easy bruising PHYSICAL EXAMINATION: VITAL SIGNS:  See vs page GENERAL: no distress EXTEMITIES: left bka. no ulcer on the right foot. Right foot has normal color and temp. no edema. There is onychomycosis on the right toenails. The right 5th toenail is surgically absent.  LAB/XRAY RESULTS: Lab Results  Component Value Date   WBC 7.1 12/14/2011   HGB 12.1 12/14/2011   HCT 37.4 12/14/2011   PLT 174 12/14/2011   GLUCOSE 128* 12/14/2011   CHOL 129 12/14/2011   TRIG 76  12/14/2011   HDL 45 12/14/2011   LDLCALC 69 12/14/2011   ALT 12 12/14/2011   AST 21 12/14/2011   NA 141 12/14/2011   K 4.4 12/14/2011   CL 111 12/14/2011   CREATININE 1.69* 12/14/2011   BUN 25* 12/14/2011   CO2 23 12/14/2011   TSH 2.015 12/14/2011   HGBA1C 6.2* 12/14/2011   MICROALBUR 0.50 12/14/2011  fe is high IMPRESSION: Renal insuff, slightly worse DM, well-controlled fe-deficiency anemia, overcontrolled PLAN: See instruction page

## 2011-12-15 LAB — URINALYSIS, ROUTINE W REFLEX MICROSCOPIC
Glucose, UA: NEGATIVE mg/dL
Hgb urine dipstick: NEGATIVE
Leukocytes, UA: NEGATIVE
Nitrite: NEGATIVE
Protein, ur: NEGATIVE mg/dL
Urobilinogen, UA: 0.2 mg/dL (ref 0.0–1.0)

## 2011-12-15 LAB — MICROALBUMIN / CREATININE URINE RATIO
Creatinine, Urine: 59.1 mg/dL
Microalb Creat Ratio: 8.5 mg/g (ref 0.0–30.0)
Microalb, Ur: 0.5 mg/dL (ref 0.00–1.89)

## 2011-12-17 ENCOUNTER — Other Ambulatory Visit: Payer: Self-pay | Admitting: Endocrinology

## 2011-12-17 NOTE — Telephone Encounter (Signed)
Refill req for Isosorbide refilled last 4/14 and K-lor last filled 11/18/11. K-lor has no dosage with it. Pt last seen on 7/29. Please advise. Thanks.

## 2012-01-18 ENCOUNTER — Encounter: Payer: Self-pay | Admitting: Endocrinology

## 2012-02-04 ENCOUNTER — Telehealth: Payer: Self-pay | Admitting: *Deleted

## 2012-02-04 NOTE — Telephone Encounter (Signed)
Pt received letter from [facility] stating that they would no longer pay for Novolog-alternative is Humalog. Pt requesting MD's advisement regarding this.

## 2012-02-06 NOTE — Telephone Encounter (Signed)
UNABLE TO RETURN CALL . PHONE # IN EPIC IS NO LONGER AVAILABLE/DISCONNECTED.

## 2012-02-22 ENCOUNTER — Other Ambulatory Visit: Payer: Self-pay | Admitting: Endocrinology

## 2012-02-28 ENCOUNTER — Telehealth: Payer: Self-pay | Admitting: *Deleted

## 2012-02-28 NOTE — Telephone Encounter (Signed)
Pt requesting callback from office to speak with someone about her medications.

## 2012-02-29 NOTE — Telephone Encounter (Signed)
PHONE # [id number] UNABLE TO REACH PT.

## 2012-03-24 ENCOUNTER — Other Ambulatory Visit: Payer: Self-pay

## 2012-03-24 MED ORDER — INSULIN ASPART 100 UNIT/ML ~~LOC~~ SOLN: 5.0000 [IU] | Freq: Three times a day (TID) | SUBCUTANEOUS | Status: DC

## 2012-04-15 ENCOUNTER — Other Ambulatory Visit: Payer: Self-pay | Admitting: Endocrinology

## 2012-04-15 MED ORDER — INSULIN LISPRO 100 UNIT/ML ~~LOC~~ SOLN: 5.0000 [IU] | Freq: Three times a day (TID) | SUBCUTANEOUS | Status: DC

## 2012-04-17 ENCOUNTER — Other Ambulatory Visit: Payer: Self-pay | Admitting: *Deleted

## 2012-04-17 ENCOUNTER — Other Ambulatory Visit: Payer: Self-pay | Admitting: Endocrinology

## 2012-04-17 MED ORDER — OMEPRAZOLE 20 MG PO CPDR: 20.0000 mg | DELAYED_RELEASE_CAPSULE | Freq: Every day | ORAL | Status: DC

## 2012-04-17 MED ORDER — FUROSEMIDE 40 MG PO TABS: 40.0000 mg | ORAL_TABLET | Freq: Every day | ORAL | Status: DC

## 2012-04-17 MED ORDER — ATENOLOL 25 MG PO TABS: 25.0000 mg | ORAL_TABLET | Freq: Every day | ORAL | Status: DC

## 2012-04-17 MED ORDER — SIMVASTATIN 40 MG PO TABS: 40.0000 mg | ORAL_TABLET | Freq: Every day | ORAL | Status: DC

## 2012-04-17 MED ORDER — LOSARTAN POTASSIUM-HCTZ 100-25 MG PO TABS: 1.0000 | ORAL_TABLET | Freq: Every day | ORAL | Status: DC

## 2012-04-17 MED ORDER — CHOLESTYRAMINE 4 GM/DOSE PO POWD: ORAL | Status: DC

## 2012-04-17 MED ORDER — TRAZODONE HCL 100 MG PO TABS: 100.0000 mg | ORAL_TABLET | Freq: Every day | ORAL | Status: DC

## 2012-04-17 NOTE — Telephone Encounter (Signed)
Ov is due 

## 2012-05-14 ENCOUNTER — Other Ambulatory Visit: Payer: Self-pay | Admitting: *Deleted

## 2012-05-14 MED ORDER — POTASSIUM CHLORIDE CRYS ER 10 MEQ PO TBCR: EXTENDED_RELEASE_TABLET | ORAL | Status: DC

## 2012-06-04 ENCOUNTER — Other Ambulatory Visit: Payer: Self-pay | Admitting: *Deleted

## 2012-06-04 MED ORDER — INSULIN LISPRO 100 UNIT/ML ~~LOC~~ SOLN: 15.0000 [IU] | Freq: Three times a day (TID) | SUBCUTANEOUS | Status: DC

## 2012-06-18 ENCOUNTER — Other Ambulatory Visit: Payer: Self-pay

## 2012-06-18 MED ORDER — ISOSORBIDE MONONITRATE 10 MG PO TABS: ORAL_TABLET | ORAL | Status: DC

## 2012-07-02 ENCOUNTER — Encounter: Payer: Self-pay | Admitting: Gastroenterology

## 2012-07-04 ENCOUNTER — Ambulatory Visit (INDEPENDENT_AMBULATORY_CARE_PROVIDER_SITE_OTHER): Payer: Medicare Other | Admitting: Endocrinology

## 2012-07-04 ENCOUNTER — Encounter: Payer: Self-pay | Admitting: Endocrinology

## 2012-07-04 VITALS — BP 126/80 | HR 76

## 2012-07-04 DIAGNOSIS — E1029 Type 1 diabetes mellitus with other diabetic kidney complication: Secondary | ICD-10-CM

## 2012-07-04 DIAGNOSIS — D509 Iron deficiency anemia, unspecified: Secondary | ICD-10-CM

## 2012-07-04 LAB — CBC WITH DIFFERENTIAL/PLATELET
Basophils Relative: 1 % (ref 0–1)
HCT: 33.8 % — ABNORMAL LOW (ref 36.0–46.0)
Hemoglobin: 11.3 g/dL — ABNORMAL LOW (ref 12.0–15.0)
Lymphs Abs: 1.6 10*3/uL (ref 0.7–4.0)
MCH: 30.4 pg (ref 26.0–34.0)
MCHC: 33.4 g/dL (ref 30.0–36.0)
Monocytes Absolute: 0.5 10*3/uL (ref 0.1–1.0)
Monocytes Relative: 7 % (ref 3–12)
Neutro Abs: 4.9 10*3/uL (ref 1.7–7.7)

## 2012-07-04 LAB — HEMOGLOBIN A1C: Mean Plasma Glucose: 105 mg/dL (ref <117)

## 2012-07-04 NOTE — Progress Notes (Signed)
Subjective:    Patient ID: Angelica Lawrence, female    DOB: 05/15/1926, 79 y.o.   MRN: [id number]  HPI Pt returns for f/u of IDDM (dx'ed, complicated by peripheral sensory neuropathy, CVA, CAD, background retinopathy, left leg BKA, proteinuria, and PAD).  no cbg record, but states cbg's are well-controlled.  She is having hypoglycemia in the afternoon.  1 month ago, pt stumbled and fell several times.  Since then, she has slight pain at the left knee, and assoc pain at the lower back.  dtr says this was due to a poorly-fitting prosthesis (BKA), and it is being repaired now.   She did not d/c fe tabs as advised.  Past Medical History  Diagnosis Date  . ALLERGIC RHINITIS 12/30/2006  . ANEMIA, IRON DEFICIENCY 10/21/2009  . COLONIC POLYPS, HX OF 12/30/2006  . CORONARY ARTERY DISEASE 12/30/2006  . DIABETES MELLITUS, TYPE I 12/30/2006  . DIVERTICULOSIS, COLON 12/30/2006  . HYPERLIPIDEMIA 12/30/2006  . HYPERTENSION 12/30/2006  . PERIPHERAL NEUROPATHY 12/30/2006  . PERIPHERAL VASCULAR DISEASE 12/30/2006  . GOITER 12/30/2006  . VITAMIN B12 DEFICIENCY 09/16/2008  . Carpal tunnel syndrome 12/30/2006  . CATARACTS, BILATERAL 12/30/2006  . HEMORRHOIDS 12/30/2006  . DEGENERATIVE JOINT DISEASE 02/10/2007  . INSOMNIA 10/21/2009  . CORNEAL TRANSPLANT 12/30/2006  . BKA, LEFT LEG 12/30/2006    Past Surgical History  Procedure Laterality Date  . Appendectomy    . Cholecystectomy    . Abdominal hysterectomy  1960's    and bso  . Below knee leg amputation  1989    L    History   Social History  . Marital Status: Widowed    Spouse Name: N/A    Number of Children: N/A  . Years of Education: N/A   Occupational History  . Retired    Social History Main Topics  . Smoking status: Never Smoker   . Smokeless tobacco: Not on file  . Alcohol Use: No  . Drug Use: No  . Sexually Active: Not on file   Other Topics Concern  . Not on file   Social History Narrative  . No narrative on file     Current Outpatient Prescriptions on File Prior to Visit  Medication Sig Dispense Refill  . aspirin 81 MG tablet Take 81 mg by mouth daily.        Marland Kitchen atenolol (TENORMIN) 25 MG tablet Take 1 tablet (25 mg total) by mouth daily.  30 tablet  3  . cholestyramine (QUESTRAN) 4 GM/DOSE powder USE 1 SCOOP ONCE DAILY WITH 2 OZ OF WATER  378 g  3  . cholestyramine (QUESTRAN) 4 GM/DOSE powder USE ONE SCOOP ONCE DAILY WITH 2 OZ OF WATER. TAKE BY MOUTH  378 g  10  . ferrous sulfate ([facility] IRON) 325 (65 FE) MG tablet Take 325 mg by mouth daily.        . furosemide (LASIX) 40 MG tablet Take 1 tablet (40 mg total) by mouth daily.  30 tablet  3  . Insulin Pen Needle 31G X 8 MM MISC 1 each by Does not apply route daily.  100 each  1  . isosorbide mononitrate (ISMO,MONOKET) 10 MG tablet TAKE 1 TABLET EVERY DAY  30 tablet  5  . KLOR-CON M10 10 MEQ tablet 1 tab daily      . Linaclotide (LINZESS) 145 MCG CAPS Take 145 mcg by mouth daily.      Marland Kitchen losartan-hydrochlorothiazide (HYZAAR) 100-25 MG per tablet Take 1 tablet by mouth daily.  30 tablet  3  . omeprazole (PRILOSEC) 20 MG capsule Take 1 capsule (20 mg total) by mouth daily.  30 capsule  3  . potassium chloride (KLOR-CON M10) 10 MEQ tablet TAKE 1 TABLET BY MOUTH EVERY DAY NOTICE TO PATIENT NEED TO MAKE FOLLOW UP APPOINTMENT WITH DR. Everardo All.  30 tablet  4  . simvastatin (ZOCOR) 40 MG tablet Take 1 tablet (40 mg total) by mouth at bedtime.  30 tablet  3  . traZODone (DESYREL) 100 MG tablet Take 1 tablet (100 mg total) by mouth at bedtime.  30 tablet  3  . [DISCONTINUED] potassium chloride (K-DUR) 10 MEQ tablet Take 1 tablet (10 mEq total) by mouth daily.  30 tablet  4   No current facility-administered medications on file prior to visit.    No Known Allergies  Family History  Problem Relation Age of Onset  . Cancer Neg Hx   . Heart disease Neg Hx    BP 126/80  Pulse 76  SpO2 98%  Review of Systems Denies LOC and headache.      Objective:    Physical Exam VITAL SIGNS:  See vs page GENERAL: no distress.  In wheelchair.   Ext: slight abrasion on the left knee, anteriorly.    Lab Results  Component Value Date   WBC 7.2 07/04/2012   HGB 11.3* 07/04/2012   HCT 33.8* 07/04/2012   MCV 90.9 07/04/2012   PLT 174 07/04/2012      Assessment & Plan:  DM: overcontrolled. Anemia, with persistent Knee abrasion, new.    She declines x-rays.

## 2012-07-04 NOTE — Patient Instructions (Addendum)
blood tests are being requested for you today.  We'll contact you with results. check your blood sugar twice a day.  vary the time of day when you check, between before the 3 meals, and at bedtime.  also check if you have symptoms of your blood sugar being too high or too low.  please keep a record of the readings and bring it to your next appointment here.  please call us sooner if your blood sugar goes below 70, or if you have a lot of readings over 200. Please decrease the lunch insulin to 10 units. Based on the results, you may need to stop the iron pills. Please come back for a follow-up appointment in 3 months.

## 2012-07-05 LAB — IBC PANEL
%SAT: 63 % — ABNORMAL HIGH (ref 20–55)
TIBC: 192 ug/dL — ABNORMAL LOW (ref 250–470)

## 2012-07-05 LAB — IRON: Iron: 121 ug/dL (ref 42–145)

## 2012-07-10 ENCOUNTER — Telehealth: Payer: Self-pay | Admitting: Endocrinology

## 2012-07-10 NOTE — Telephone Encounter (Signed)
[place], [phone]

## 2012-07-10 NOTE — Telephone Encounter (Signed)
Tried calling pt 3 times. The number has been disconnected. I called Cathy, NP at [phone] and she said that is the number she has. Please advise what to do from here.Thank you.

## 2012-07-10 NOTE — Telephone Encounter (Signed)
NP visited patient yesterday at her house. Husband has been giving insulin at lunchtime and at 3:00PM. Is only testing at [facility]. Her numbers are running around 50. She is concerned. The patient also has a scabby sore where her prothesis fits. The NP will not be avail by phone tomorrow 07/11/12, she will be working in a remote location with the [employer]. Can someone call her this afternoon?

## 2012-07-10 NOTE — Telephone Encounter (Signed)
Please read the note below and advise in Dr George Hugh absence. Thank you.

## 2012-07-10 NOTE — Telephone Encounter (Signed)
Carollee Herter, I talked to [facility], and she was telling me that the patient has sugars in the 50s, despite not taking her morning and evening insulin, and only taking 10 units with lunch. She has lunch around 3 PM. Her husband is giving her the injections at lunch but they check sugars around 6 to 7 PM and this is when they see the low sugars. Can you please call pt (or her husband) to decrease the lunchtime insulin to 5 units?

## 2012-07-10 NOTE — Telephone Encounter (Signed)
Angelica Lawrence, should I call the pt or the NP? If the NP, what telephone nr?

## 2012-07-10 NOTE — Telephone Encounter (Signed)
Please reduce insulin to 5 units 3 times a day (just before each meal)

## 2012-07-11 NOTE — Telephone Encounter (Signed)
Please send the message to the NP, thanks

## 2012-07-14 ENCOUNTER — Telehealth: Payer: Self-pay | Admitting: Endocrinology

## 2012-07-14 ENCOUNTER — Telehealth: Payer: Self-pay | Admitting: *Deleted

## 2012-07-14 NOTE — Telephone Encounter (Signed)
Daughter in law, Angelica Lawrence, called stating that she and her husband (pt's son) are concerned about pt. They are concerned about pt's DM, nutrition and ability to get around, decubitus ulcer/sore and function.They wanted to schedule an OV to see Dr Everardo All to discuss some health issues they are concerned about. Advised them about being unable to reach pt by phone. Phone in our system was wrong. Corrected this. Daughter in law gave a number for [place] to reach her in case we need to. Work # [phone] or cell # [phone]. Scheduled pt and son to come in on Wed, May 7th. Told to be here at 1:15 pm.

## 2012-07-14 NOTE — Telephone Encounter (Signed)
Patients family is requesting to have patient switched from Dr. Everardo All to [facility], please advise

## 2012-07-14 NOTE — Telephone Encounter (Signed)
Gaspar Cola, NP, at [phone] per Dr Everardo All and LVM for her to contact pt's husband and to be sure to let them know that they need to reduce pt's insulin to 5 units 3 times a day. Asked her to call back and confirm she received the message and to let us know that she will tell pt's husband/pt.

## 2012-07-14 NOTE — Telephone Encounter (Signed)
ok 

## 2012-07-15 NOTE — Telephone Encounter (Signed)
Left message with Victorino Dike to call back and schedule

## 2012-07-15 NOTE — Telephone Encounter (Signed)
Ok to schedule and change PCP to [facility]

## 2012-07-15 NOTE — Telephone Encounter (Signed)
Carollee Herter addressed

## 2012-07-16 ENCOUNTER — Ambulatory Visit: Payer: Medicare Other | Admitting: Endocrinology

## 2012-07-16 ENCOUNTER — Ambulatory Visit (INDEPENDENT_AMBULATORY_CARE_PROVIDER_SITE_OTHER): Payer: Medicare Other | Admitting: Endocrinology

## 2012-07-16 ENCOUNTER — Encounter: Payer: Self-pay | Admitting: Endocrinology

## 2012-07-16 VITALS — BP 134/80 | HR 77

## 2012-07-16 DIAGNOSIS — E1029 Type 1 diabetes mellitus with other diabetic kidney complication: Secondary | ICD-10-CM

## 2012-07-16 NOTE — Progress Notes (Signed)
Subjective:    Patient ID: Angelica Lawrence, female    DOB: 02-May-1926, 79 y.o.   MRN: [id number]  HPI Pt returns for f/u of IDDM (dx'ed, complicated by peripheral sensory neuropathy, CVA, CAD, background retinopathy, left leg BKA, proteinuria, and PAD).  no cbg record, but states cbg's continue to be low, despite decreased insulin dosage.  Pt says this is due to decreased appetite.   Son says pt has generalized weakness, but this is not due to pain.   Pt says she is losing weight.  She can't be weighed today, because she cannot bear her weight.   Pt's dtr (not here today) requests pt change to a new dr.  Pt does not object.   Past Medical History  Diagnosis Date  . ALLERGIC RHINITIS 12/30/2006  . ANEMIA, IRON DEFICIENCY 10/21/2009  . COLONIC POLYPS, HX OF 12/30/2006  . CORONARY ARTERY DISEASE 12/30/2006  . DIABETES MELLITUS, TYPE I 12/30/2006  . DIVERTICULOSIS, COLON 12/30/2006  . HYPERLIPIDEMIA 12/30/2006  . HYPERTENSION 12/30/2006  . PERIPHERAL NEUROPATHY 12/30/2006  . PERIPHERAL VASCULAR DISEASE 12/30/2006  . GOITER 12/30/2006  . VITAMIN B12 DEFICIENCY 09/16/2008  . Carpal tunnel syndrome 12/30/2006  . CATARACTS, BILATERAL 12/30/2006  . HEMORRHOIDS 12/30/2006  . DEGENERATIVE JOINT DISEASE 02/10/2007  . INSOMNIA 10/21/2009  . CORNEAL TRANSPLANT 12/30/2006  . BKA, LEFT LEG 12/30/2006    Past Surgical History  Procedure Laterality Date  . Appendectomy    . Cholecystectomy    . Abdominal hysterectomy  1960's    and bso  . Below knee leg amputation  1989    L    History   Social History  . Marital Status: Widowed    Spouse Name: N/A    Number of Children: N/A  . Years of Education: N/A   Occupational History  . Retired    Social History Main Topics  . Smoking status: Never Smoker   . Smokeless tobacco: Not on file  . Alcohol Use: No  . Drug Use: No  . Sexually Active: Not on file   Other Topics Concern  . Not on file   Social History Narrative  . No narrative on  file    Current Outpatient Prescriptions on File Prior to Visit  Medication Sig Dispense Refill  . aspirin 81 MG tablet Take 81 mg by mouth daily.        Marland Kitchen atenolol (TENORMIN) 25 MG tablet Take 1 tablet (25 mg total) by mouth daily.  30 tablet  3  . cholestyramine (QUESTRAN) 4 GM/DOSE powder USE 1 SCOOP ONCE DAILY WITH 2 OZ OF WATER  378 g  3  . ferrous sulfate ([facility] IRON) 325 (65 FE) MG tablet Take 325 mg by mouth daily.        . furosemide (LASIX) 40 MG tablet Take 1 tablet (40 mg total) by mouth daily.  30 tablet  3  . Insulin Pen Needle 31G X 8 MM MISC 1 each by Does not apply route daily.  100 each  1  . isosorbide mononitrate (ISMO,MONOKET) 10 MG tablet TAKE 1 TABLET EVERY DAY  30 tablet  5  . KLOR-CON M10 10 MEQ tablet 1 tab daily      . Linaclotide (LINZESS) 145 MCG CAPS Take 145 mcg by mouth daily.      Marland Kitchen losartan-hydrochlorothiazide (HYZAAR) 100-25 MG per tablet Take 1 tablet by mouth daily.  30 tablet  3  . omeprazole (PRILOSEC) 20 MG capsule Take 1 capsule (20 mg total) by  mouth daily.  30 capsule  3  . potassium chloride (KLOR-CON M10) 10 MEQ tablet TAKE 1 TABLET BY MOUTH EVERY DAY NOTICE TO PATIENT NEED TO MAKE FOLLOW UP APPOINTMENT WITH DR. Everardo All.  30 tablet  4  . simvastatin (ZOCOR) 40 MG tablet Take 1 tablet (40 mg total) by mouth at bedtime.  30 tablet  3  . traZODone (DESYREL) 100 MG tablet Take 1 tablet (100 mg total) by mouth at bedtime.  30 tablet  3  . [DISCONTINUED] potassium chloride (K-DUR) 10 MEQ tablet Take 1 tablet (10 mEq total) by mouth daily.  30 tablet  4   No current facility-administered medications on file prior to visit.    No Known Allergies Family History  Problem Relation Age of Onset  . Cancer Neg Hx   . Heart disease Neg Hx    BP 134/80  Pulse 77  SpO2 98%  Review of Systems Denies LOC, nausea, abd pain, diarrhea, dysphagia, depression, and fever.     Objective:   Physical Exam VITAL SIGNS:  See vs page GENERAL: no distress.  In  wheelchair. Motor: 5/5, except for LUE (son say this is due to a recent fall, although pt says LUE movement is not painful) Left knee: the abrasion no longer has an open wound, but there is a scab, 3 cm diameter.  No erythema or drainage.   (i reviewed 2013 ct report)    Assessment & Plan:  Dismissal letter is given to pt today.   Weight loss, uncertain etiology. Pt is unable to bear her weight, so we can't safely weight pt today.  i offered ref to gi and recheck bmet.  Pt says she does not want anything else now.  This is important because pt need further testing, as i am not certain why pt is feeling poorly.   Knee abrasion, improving. DM, overcontrolled.  This may be due to weight loss.

## 2012-07-16 NOTE — Patient Instructions (Addendum)
For now, please stop insulin altogether. Please keep the area on your left knee covered with antibiotic ointment and a large bandaid. With time, it will heal enough to resume wearing the prosthesis.  [phone], to make an appointment for a mammogram.   Best wishes with your new primary care provider.

## 2012-08-07 ENCOUNTER — Encounter: Payer: Self-pay | Admitting: Endocrinology

## 2012-08-10 ENCOUNTER — Encounter: Payer: Self-pay | Admitting: Endocrinology

## 2012-08-13 ENCOUNTER — Telehealth: Payer: Self-pay | Admitting: Endocrinology

## 2012-08-13 NOTE — Telephone Encounter (Signed)
Dismissal Letter sent by Certified Mail 08/13/2012  Received the Return Receipt showing someone picked up the Dismissal Letter 08/15/2012

## 2012-08-18 ENCOUNTER — Other Ambulatory Visit: Payer: Self-pay | Admitting: *Deleted

## 2012-08-18 MED ORDER — FUROSEMIDE 40 MG PO TABS: 40.0000 mg | ORAL_TABLET | Freq: Every day | ORAL | Status: DC

## 2012-08-18 MED ORDER — OMEPRAZOLE 20 MG PO CPDR: 20.0000 mg | DELAYED_RELEASE_CAPSULE | Freq: Every day | ORAL | Status: AC

## 2012-08-18 MED ORDER — LOSARTAN POTASSIUM-HCTZ 100-25 MG PO TABS: 1.0000 | ORAL_TABLET | Freq: Every day | ORAL | Status: DC

## 2012-08-18 MED ORDER — ATENOLOL 25 MG PO TABS: 25.0000 mg | ORAL_TABLET | Freq: Every day | ORAL | Status: DC

## 2012-08-18 MED ORDER — SIMVASTATIN 40 MG PO TABS: 40.0000 mg | ORAL_TABLET | Freq: Every day | ORAL | Status: DC

## 2012-10-03 ENCOUNTER — Ambulatory Visit: Payer: Medicare Other | Admitting: Endocrinology

## 2012-10-10 DIAGNOSIS — I82409 Acute embolism and thrombosis of unspecified deep veins of unspecified lower extremity: Secondary | ICD-10-CM

## 2012-10-10 HISTORY — DX: Acute embolism and thrombosis of unspecified deep veins of unspecified lower extremity: I82.409

## 2012-10-16 ENCOUNTER — Emergency Department (HOSPITAL_COMMUNITY): Payer: Medicare Other

## 2012-10-16 ENCOUNTER — Inpatient Hospital Stay (HOSPITAL_COMMUNITY)
Admission: EM | Admit: 2012-10-16 | Discharge: 2012-10-21 | DRG: 637 | Disposition: A | Discharge status: Skilled Nursing Facility | Payer: Medicare Other | Attending: Internal Medicine | Admitting: Internal Medicine

## 2012-10-16 DIAGNOSIS — IMO0002 Reserved for concepts with insufficient information to code with codable children: Secondary | ICD-10-CM

## 2012-10-16 DIAGNOSIS — D638 Anemia in other chronic diseases classified elsewhere: Secondary | ICD-10-CM | POA: Diagnosis present

## 2012-10-16 DIAGNOSIS — A4902 Methicillin resistant Staphylococcus aureus infection, unspecified site: Secondary | ICD-10-CM | POA: Diagnosis present

## 2012-10-16 DIAGNOSIS — E43 Unspecified severe protein-calorie malnutrition: Secondary | ICD-10-CM | POA: Insufficient documentation

## 2012-10-16 DIAGNOSIS — E46 Unspecified protein-calorie malnutrition: Secondary | ICD-10-CM

## 2012-10-16 DIAGNOSIS — S88119A Complete traumatic amputation at level between knee and ankle, unspecified lower leg, initial encounter: Secondary | ICD-10-CM | POA: Diagnosis present

## 2012-10-16 DIAGNOSIS — I2510 Atherosclerotic heart disease of native coronary artery without angina pectoris: Secondary | ICD-10-CM | POA: Diagnosis present

## 2012-10-16 DIAGNOSIS — L97309 Non-pressure chronic ulcer of unspecified ankle with unspecified severity: Secondary | ICD-10-CM | POA: Diagnosis present

## 2012-10-16 DIAGNOSIS — M869 Osteomyelitis, unspecified: Secondary | ICD-10-CM | POA: Diagnosis present

## 2012-10-16 DIAGNOSIS — S82899A Other fracture of unspecified lower leg, initial encounter for closed fracture: Secondary | ICD-10-CM | POA: Diagnosis present

## 2012-10-16 DIAGNOSIS — N179 Acute kidney failure, unspecified: Secondary | ICD-10-CM | POA: Diagnosis present

## 2012-10-16 DIAGNOSIS — L89519 Pressure ulcer of right ankle, unspecified stage: Secondary | ICD-10-CM

## 2012-10-16 DIAGNOSIS — Z66 Do not resuscitate: Secondary | ICD-10-CM | POA: Diagnosis present

## 2012-10-16 DIAGNOSIS — M949 Disorder of cartilage, unspecified: Secondary | ICD-10-CM | POA: Diagnosis present

## 2012-10-16 DIAGNOSIS — E1165 Type 2 diabetes mellitus with hyperglycemia: Secondary | ICD-10-CM

## 2012-10-16 DIAGNOSIS — S82401K Unspecified fracture of shaft of right fibula, subsequent encounter for closed fracture with nonunion: Secondary | ICD-10-CM

## 2012-10-16 DIAGNOSIS — I129 Hypertensive chronic kidney disease with stage 1 through stage 4 chronic kidney disease, or unspecified chronic kidney disease: Secondary | ICD-10-CM | POA: Diagnosis present

## 2012-10-16 DIAGNOSIS — I739 Peripheral vascular disease, unspecified: Secondary | ICD-10-CM

## 2012-10-16 DIAGNOSIS — E119 Type 2 diabetes mellitus without complications: Secondary | ICD-10-CM

## 2012-10-16 DIAGNOSIS — I10 Essential (primary) hypertension: Secondary | ICD-10-CM

## 2012-10-16 DIAGNOSIS — E1029 Type 1 diabetes mellitus with other diabetic kidney complication: Secondary | ICD-10-CM

## 2012-10-16 DIAGNOSIS — D649 Anemia, unspecified: Secondary | ICD-10-CM

## 2012-10-16 DIAGNOSIS — E1069 Type 1 diabetes mellitus with other specified complication: Principal | ICD-10-CM | POA: Diagnosis present

## 2012-10-16 DIAGNOSIS — R739 Hyperglycemia, unspecified: Secondary | ICD-10-CM

## 2012-10-16 DIAGNOSIS — I824Y9 Acute embolism and thrombosis of unspecified deep veins of unspecified proximal lower extremity: Secondary | ICD-10-CM | POA: Diagnosis present

## 2012-10-16 DIAGNOSIS — R609 Edema, unspecified: Secondary | ICD-10-CM

## 2012-10-16 DIAGNOSIS — E871 Hypo-osmolality and hyponatremia: Secondary | ICD-10-CM | POA: Diagnosis present

## 2012-10-16 DIAGNOSIS — S82401A Unspecified fracture of shaft of right fibula, initial encounter for closed fracture: Secondary | ICD-10-CM

## 2012-10-16 DIAGNOSIS — S82409A Unspecified fracture of shaft of unspecified fibula, initial encounter for closed fracture: Secondary | ICD-10-CM

## 2012-10-16 DIAGNOSIS — R339 Retention of urine, unspecified: Secondary | ICD-10-CM | POA: Diagnosis present

## 2012-10-16 DIAGNOSIS — M899 Disorder of bone, unspecified: Secondary | ICD-10-CM | POA: Diagnosis present

## 2012-10-16 DIAGNOSIS — I959 Hypotension, unspecified: Secondary | ICD-10-CM | POA: Diagnosis present

## 2012-10-16 DIAGNOSIS — Z794 Long term (current) use of insulin: Secondary | ICD-10-CM | POA: Diagnosis present

## 2012-10-16 DIAGNOSIS — L8990 Pressure ulcer of unspecified site, unspecified stage: Secondary | ICD-10-CM

## 2012-10-16 DIAGNOSIS — N189 Chronic kidney disease, unspecified: Secondary | ICD-10-CM | POA: Diagnosis present

## 2012-10-16 DIAGNOSIS — X58XXXA Exposure to other specified factors, initial encounter: Secondary | ICD-10-CM | POA: Diagnosis present

## 2012-10-16 DIAGNOSIS — L89509 Pressure ulcer of unspecified ankle, unspecified stage: Secondary | ICD-10-CM | POA: Diagnosis present

## 2012-10-16 LAB — TROPONIN I: Troponin I: <0.3 ng/mL (ref <0.30)

## 2012-10-16 LAB — CBC WITH DIFFERENTIAL/PLATELET
Basophils Absolute: 0 10*3/uL (ref 0.0–0.1)
Eosinophils Absolute: 0.1 10*3/uL (ref 0.0–0.7)
Eosinophils Relative: 1 % (ref 0–5)
Lymphocytes Relative: 21 % (ref 12–46)
MCV: 88.8 fL (ref 78.0–100.0)
Neutrophils Relative %: 71 % (ref 43–77)
Platelets: 203 10*3/uL (ref 150–400)
RDW: 13.8 % (ref 11.5–15.5)
WBC: 9.2 10*3/uL (ref 4.0–10.5)

## 2012-10-16 LAB — COMPREHENSIVE METABOLIC PANEL
ALT: 6 U/L (ref 0–35)
AST: 14 U/L (ref 0–37)
Calcium: 8.9 mg/dL (ref 8.4–10.5)
Potassium: 3.9 mEq/L (ref 3.5–5.1)
Sodium: 131 mEq/L — ABNORMAL LOW (ref 135–145)
Total Protein: 6.4 g/dL (ref 6.0–8.3)

## 2012-10-16 LAB — SEDIMENTATION RATE: Sed Rate: 55 mm/hr — ABNORMAL HIGH (ref 0–22)

## 2012-10-16 LAB — D-DIMER, QUANTITATIVE: D-Dimer, Quant: 7.56 ug/mL-FEU — ABNORMAL HIGH (ref 0.00–0.48)

## 2012-10-16 LAB — PRO B NATRIURETIC PEPTIDE: Pro B Natriuretic peptide (BNP): 1528 pg/mL — ABNORMAL HIGH (ref 0–450)

## 2012-10-16 LAB — PROTIME-INR: INR: 1.17 (ref 0.00–1.49)

## 2012-10-16 MED ORDER — LINACLOTIDE 145 MCG PO CAPS
145.0000 ug | ORAL_CAPSULE | Freq: Every day | ORAL | Status: DC
  Administered 2012-10-17 – 2012-10-21 (×4): 145 ug via ORAL
  Filled 2012-10-16 (×4): qty 1

## 2012-10-16 MED ORDER — ISOSORBIDE MONONITRATE 10 MG PO TABS
10.0000 mg | ORAL_TABLET | Freq: Every day | ORAL | Status: DC
  Administered 2012-10-17: 10 mg via ORAL
  Filled 2012-10-16: qty 1

## 2012-10-16 MED ORDER — SODIUM CHLORIDE 0.9 % IV SOLN
INTRAVENOUS | Status: DC
  Administered 2012-10-16 – 2012-10-19 (×4): via INTRAVENOUS

## 2012-10-16 MED ORDER — TRAZODONE HCL 100 MG PO TABS
100.0000 mg | ORAL_TABLET | Freq: Every day | ORAL | Status: DC
  Administered 2012-10-17 – 2012-10-20 (×3): 100 mg via ORAL
  Filled 2012-10-16 (×5): qty 1

## 2012-10-16 MED ORDER — ASPIRIN EC 81 MG PO TBEC
81.0000 mg | DELAYED_RELEASE_TABLET | Freq: Every day | ORAL | Status: DC
  Administered 2012-10-17: 81 mg via ORAL
  Filled 2012-10-16: qty 1

## 2012-10-16 MED ORDER — CIPROFLOXACIN IN D5W 400 MG/200ML IV SOLN
400.0000 mg | Freq: Once | INTRAVENOUS | Status: AC
  Administered 2012-10-16: 400 mg via INTRAVENOUS
  Filled 2012-10-16: qty 200

## 2012-10-16 MED ORDER — PANTOPRAZOLE SODIUM 40 MG PO TBEC
40.0000 mg | DELAYED_RELEASE_TABLET | Freq: Every day | ORAL | Status: DC
  Administered 2012-10-17 – 2012-10-21 (×4): 40 mg via ORAL
  Filled 2012-10-16 (×5): qty 1

## 2012-10-16 MED ORDER — FERROUS SULFATE 325 (65 FE) MG PO TABS
325.0000 mg | ORAL_TABLET | Freq: Every day | ORAL | Status: DC
  Administered 2012-10-17 – 2012-10-21 (×4): 325 mg via ORAL
  Filled 2012-10-16 (×4): qty 1

## 2012-10-16 MED ORDER — SODIUM CHLORIDE 0.9 % IJ SOLN
3.0000 mL | Freq: Two times a day (BID) | INTRAMUSCULAR | Status: DC
  Administered 2012-10-17 – 2012-10-21 (×4): 3 mL via INTRAVENOUS

## 2012-10-16 MED ORDER — SIMVASTATIN 40 MG PO TABS
40.0000 mg | ORAL_TABLET | Freq: Every day | ORAL | Status: DC
  Administered 2012-10-17 – 2012-10-20 (×4): 40 mg via ORAL
  Filled 2012-10-16 (×5): qty 1

## 2012-10-16 MED ORDER — SODIUM CHLORIDE 0.9 % IV BOLUS (SEPSIS): 500.0000 mL | Freq: Once | INTRAVENOUS | Status: DC

## 2012-10-16 MED ORDER — VANCOMYCIN HCL IN DEXTROSE 1-5 GM/200ML-% IV SOLN
1000.0000 mg | Freq: Once | INTRAVENOUS | Status: AC
  Administered 2012-10-17: 1000 mg via INTRAVENOUS
  Filled 2012-10-16: qty 200

## 2012-10-16 NOTE — H&P (Signed)
Hospitalist Admission History and Physical  Patient name: Angelica Lawrence Medical record number: [id number] Date of birth: 12-24-1926 Age: 79 y.o. Gender: female  Primary Care Provider: [name], MD  Chief Complaint: R lateral malleolar osteomyelitis  History of Present Illness:This is a 79 y.o. year old female with significant past medical history of diabetes, CKD, peripheral vascular disease status post left BKA,presenting with  right lateral malleolar osteomyelitis. Per family, patient to see her PCP earlier in the week and he noticed a right lateral malleolus ulcer. Affected area seemed to progressively worsen over the course of the week albeit mild. Patient states that patient denies any fevers or chills. Ulcer is been present for an extended period of time however is unaware of how long. no known trauma. baseline type I diabetic. Was taken off of insulin secondary to recurrent hypoglycemia 6 months ago.   patient presents to the ER. Had an x-ray done of the right ankle showing osteomyelitic changes. Orthopedics was counseled Delbert Harness). Preliminary plan is for wound care antibiotics per report. Pt also had transient episode of CP and SOB on presentation that self resolved. Had an elevated d dimer in setting of renal failure. CP and SOB self resolved. Satting well on RA with no supplemental O2.   Patient Active Problem List   Diagnosis Date Noted  . Diverticulitis of cecum 09/20/2011  . DM (diabetes mellitus) 09/20/2011  . Dehydration, mild 09/20/2011  . Type I (juvenile type) diabetes mellitus with renal manifestations, not stated as uncontrolled 07/23/2011  . ANEMIA, IRON DEFICIENCY 10/21/2009  . INSOMNIA 10/21/2009  . EDEMA 09/01/2009  . UNSPECIFIED DISORDER OF LIVER 04/12/2009  . VITAMIN B12 DEFICIENCY 09/16/2008  . DEGENERATIVE JOINT DISEASE 02/10/2007  . GOITER 12/30/2006  . HYPERLIPIDEMIA 12/30/2006  . CARPAL TUNNEL SYNDROME 12/30/2006  . PERIPHERAL NEUROPATHY 12/30/2006   . CATARACTS, BILATERAL 12/30/2006  . HYPERTENSION 12/30/2006  . CORONARY ARTERY DISEASE 12/30/2006  . PERIPHERAL VASCULAR DISEASE 12/30/2006  . HEMORRHOIDS 12/30/2006  . ALLERGIC RHINITIS 12/30/2006  . DIVERTICULOSIS, COLON 12/30/2006  . COLONIC POLYPS, HX OF 12/30/2006  . CORNEAL TRANSPLANT 12/30/2006  . BKA, LEFT LEG 12/30/2006   Past Medical History: Past Medical History  Diagnosis Date  . ALLERGIC RHINITIS 12/30/2006  . ANEMIA, IRON DEFICIENCY 10/21/2009  . COLONIC POLYPS, HX OF 12/30/2006  . CORONARY ARTERY DISEASE 12/30/2006  . DIABETES MELLITUS, TYPE I 12/30/2006  . DIVERTICULOSIS, COLON 12/30/2006  . HYPERLIPIDEMIA 12/30/2006  . HYPERTENSION 12/30/2006  . PERIPHERAL NEUROPATHY 12/30/2006  . PERIPHERAL VASCULAR DISEASE 12/30/2006  . GOITER 12/30/2006  . VITAMIN B12 DEFICIENCY 09/16/2008  . Carpal tunnel syndrome 12/30/2006  . CATARACTS, BILATERAL 12/30/2006  . HEMORRHOIDS 12/30/2006  . DEGENERATIVE JOINT DISEASE 02/10/2007  . INSOMNIA 10/21/2009  . CORNEAL TRANSPLANT 12/30/2006  . BKA, LEFT LEG 12/30/2006    Past Surgical History: Past Surgical History  Procedure Laterality Date  . Appendectomy    . Cholecystectomy    . Abdominal hysterectomy  1960's    and bso  . Below knee leg amputation  1989    L    Social History: History   Social History  . Marital Status: Widowed    Spouse Name: N/A    Number of Children: N/A  . Years of Education: N/A   Occupational History  . Retired    Social History Main Topics  . Smoking status: Never Smoker   . Smokeless tobacco: Not on file  . Alcohol Use: No  . Drug Use: No  . Sexually  Active: Not on file   Other Topics Concern  . Not on file   Social History Narrative  . No narrative on file    Family History: Family History  Problem Relation Age of Onset  . Cancer Neg Hx   . Heart disease Neg Hx     Allergies: No Known Allergies  Current Facility-Administered Medications  Medication Dose Route  Frequency Provider Last Rate Last Dose  . [START ON 10/17/2012] aspirin EC tablet 81 mg  81 mg Oral Daily Doree Albee, MD      . Melene Muller ON 10/17/2012] ferrous sulfate tablet 325 mg  325 mg Oral Daily Doree Albee, MD      . Melene Muller ON 10/17/2012] isosorbide mononitrate (ISMO,MONOKET) tablet 10 mg  10 mg Oral Daily Doree Albee, MD      . Melene Muller ON 10/17/2012] Linaclotide (LINZESS) capsule 145 mcg  145 mcg Oral Daily Doree Albee, MD      . Melene Muller ON 10/17/2012] pantoprazole (PROTONIX) EC tablet 40 mg  40 mg Oral Daily Doree Albee, MD      . simvastatin (ZOCOR) tablet 40 mg  40 mg Oral QHS Doree Albee, MD      . sodium chloride 0.9 % bolus 500 mL  500 mL Intravenous Once Ward Givens, MD      . sodium chloride 0.9 % injection 3 mL  3 mL Intravenous Q12H Doree Albee, MD      . traZODone (DESYREL) tablet 100 mg  100 mg Oral QHS Doree Albee, MD       Current Outpatient Prescriptions  Medication Sig Dispense Refill  . aspirin 81 MG tablet Take 81 mg by mouth daily.        Marland Kitchen atenolol (TENORMIN) 25 MG tablet Take 1 tablet (25 mg total) by mouth daily.  30 tablet  0  . ferrous sulfate ([employer] IRON) 325 (65 FE) MG tablet Take 325 mg by mouth daily.        . isosorbide mononitrate (ISMO,MONOKET) 10 MG tablet Take 10 mg by mouth daily.      . Linaclotide (LINZESS) 145 MCG CAPS Take 145 mcg by mouth daily.      Marland Kitchen losartan-hydrochlorothiazide (HYZAAR) 100-25 MG per tablet Take 1 tablet by mouth daily.  30 tablet  0  . omeprazole (PRILOSEC) 20 MG capsule Take 1 capsule (20 mg total) by mouth daily.  30 capsule  0  . simvastatin (ZOCOR) 40 MG tablet Take 1 tablet (40 mg total) by mouth at bedtime.  30 tablet  0  . traZODone (DESYREL) 100 MG tablet Take 1 tablet (100 mg total) by mouth at bedtime.  30 tablet  3  . [DISCONTINUED] potassium chloride (K-DUR) 10 MEQ tablet Take 1 tablet (10 mEq total) by mouth daily.  30 tablet  4   Review Of Systems: 12 point ROS negative except as noted above in HPI.  Physical  Exam: Filed Vitals:   10/16/12 2012  BP: 105/58  Temp:   Resp:     General: elderly appearing  HEENT: PERRLA and extra ocular movement intact Heart: S1, S2 normal, no murmur, rub or gallop, regular rate and rhythm Lungs: clear to auscultation, no wheezes or rales and unlabored breathing Abdomen: abdomen is soft without significant tenderness, masses, organomegaly or guarding Extremities: 2x2 cm ulcer on R lateral malleolus with peripheral erythema. + mild TTP, L BKA  Skin:as above  Neurology: normal without focal findings  Labs and Imaging: Lab Results  Component Value Date/Time  NA 131* 10/16/2012  8:45 PM   K 3.9 10/16/2012  8:45 PM   CL 96 10/16/2012  8:45 PM   CO2 21 10/16/2012  8:45 PM   BUN 47* 10/16/2012  8:45 PM   CREATININE 2.96* 10/16/2012  8:45 PM   CREATININE 1.69* 12/14/2011  9:35 AM   GLUCOSE 207* 10/16/2012  8:45 PM   GLUCOSE 257* 03/19/2006  9:02 AM   Lab Results  Component Value Date   WBC 9.2 10/16/2012   HGB 11.3* 10/16/2012   HCT 33.3* 10/16/2012   MCV 88.8 10/16/2012   PLT 203 10/16/2012   Lab Results  Component Value Date   DDIMER 7.56* 10/16/2012   BNP    Component Value Date/Time   PROBNP 1528.0* 10/16/2012 2045    Dg Chest 1 View  10/16/2012   *RADIOLOGY REPORT*  Clinical Data: Chest pain.  Weakness.  CHEST - 1 VIEW  Comparison: Chest x-ray 05/27/2004.  Findings: Lung volumes are normal.  No consolidative airspace disease.  No pleural effusions.  No pneumothorax.  No pulmonary nodule or mass noted.  Pulmonary vasculature and the cardiomediastinal silhouette are within normal limits. Atherosclerosis in the thoracic aorta.  Status post median sternotomy for CABG.  Post traumatic deformity of the left humeral head and neck.  IMPRESSION: 1. No radiographic evidence of acute cardiopulmonary disease. 2.  Atherosclerosis.   Original Report Authenticated By: Trudie Reed, M.D.   Dg Ankle Complete Right  10/16/2012   *RADIOLOGY REPORT*  Clinical Data: Diabetic ulcer, lateral  malleolus  RIGHT ANKLE - COMPLETE 3+ VIEW  Comparison: None.  Findings: Bones are osteopenic.  Soft tissue defect over the lateral malleolus compatible with ulceration.  There is underlying thinning of the cortex and subcortical bone loss of the lateral malleolus suspicious for early osteomyelitis.  There is also cortical step off on the oblique view compatible with a right distal fibula fracture, nondisplaced.  Distal tibia, talus and calcaneus appear intact.  Grossly normal alignment.  Peripheral vessel calcifications evident.  Right fifth toe amputation also present.  IMPRESSION: Lateral malleolar soft tissue ulceration with underlying cortical thinning and subcortical bone loss of the lateral malleolus with an associated nondisplaced fracture.  Appearance is concerning for osteomyelitis.  Osteopenia  Peripheral atherosclerosis  Previous right fifth toe amputation   Original Report Authenticated By: Judie Petit. Shick, M.D.   EKG: Afib, RBBB, rate controlled   Assessment and Plan: LIANDRA MENDIA is a 79 y.o. year old female presenting with R lateral malleolar osteomyelitis.   R lateral malleolar osteomyelitis/fracture: CAM walker at bedside per ortho. Start vanc and cipro for MRSA and gram negative coverage in setting of DM. ESR. Blood and wound culture. Wound care. Formal ortho consult pending. PT/OT.   Elevated d dimer: suspect false positive in setting of renal failure. Full dose lovenox x 1. LE dopplers and VQ scan pending. Resp status stable.   CAD/HTN/CHF: cycle CEs. Continue ASA. Hold antihypertensives given LLN BPs. Titrate as indicated. Mildly dry on exam. ProBNP also likely falsely elevated given renal insufficiency. Gentle hydration and reassess.   Anemia: likely anemia of chronic disease. Stable. Continue to follow.    CKD: Baseline Cr 1.2-1.7. Cr near 3 today. Acute on chronic. Likely secondary to dehydration. Gentle hydration and reasses.   Dm: SSI. A1C.  Hold on insulin given hx/o  hypoglycemia. Continue to monitor.  FEN/GI: Hyponatremia. Hypovolemic. Hydrate and reassess. Diabetic diet. PPI.  Prophylaxis: full dose lovenox x 1 pending VQ scan and LE dopplers  Disposition:  pending further eval  Code Status:DNR        Doree Albee MD  Pager: [phone]

## 2012-10-16 NOTE — ED Provider Notes (Signed)
CSN: [id number]     Arrival date & time 10/16/12  1844 History     First MD Initiated Contact with Patient 10/16/12 1901     Chief Complaint  Patient presents with  . Diabetic Ulcer  . Hypotension   Level V caveat for confusion  (Consider location/radiation/quality/duration/timing/severity/associated sxs/prior Treatment) HPI  Daughter states she was not aware that her mother has had an ulcer on her right ankle. She states she first became aware of it 2 days ago when it was draining. This wound has not had any specific wound care done. Evidently the ulcer has been there for several months however her mother did not let her know about the ulcer despite the living together. She states her mother has had loss of appetite. She has diabetes and recently was taken off her insulin in April because her blood sugars were getting low at night. She also is having hyperkalemia and some renal problems that seem to be improving.  PCP Dr Lacey Jensen Cardiologist Dr Tenny Craw with [facility]  Past Medical History  Diagnosis Date  . ALLERGIC RHINITIS 12/30/2006  . ANEMIA, IRON DEFICIENCY 10/21/2009  . COLONIC POLYPS, HX OF 12/30/2006  . CORONARY ARTERY DISEASE 12/30/2006  . DIABETES MELLITUS, TYPE I 12/30/2006  . DIVERTICULOSIS, COLON 12/30/2006  . HYPERLIPIDEMIA 12/30/2006  . HYPERTENSION 12/30/2006  . PERIPHERAL NEUROPATHY 12/30/2006  . PERIPHERAL VASCULAR DISEASE 12/30/2006  . GOITER 12/30/2006  . VITAMIN B12 DEFICIENCY 09/16/2008  . Carpal tunnel syndrome 12/30/2006  . CATARACTS, BILATERAL 12/30/2006  . HEMORRHOIDS 12/30/2006  . DEGENERATIVE JOINT DISEASE 02/10/2007  . INSOMNIA 10/21/2009  . CORNEAL TRANSPLANT 12/30/2006  . BKA, LEFT LEG 12/30/2006   Past Surgical History  Procedure Laterality Date  . Appendectomy    . Cholecystectomy    . Abdominal hysterectomy  1960's    and bso  . Below knee leg amputation  1989    L   Family History  Problem Relation Age of Onset  . Cancer Neg Hx   . Heart  disease Neg Hx    History  Substance Use Topics  . Smoking status: Never Smoker   . Smokeless tobacco: Not on file  . Alcohol Use: No  lives at home Lives with husband and daughter  OB History   Grav Para Term Preterm Abortions TAB SAB Ect Mult Living                 Review of Systems  Allergies  Review of patient's allergies indicates no known allergies.  Home Medications   Current Outpatient Rx  Name  Route  Sig  Dispense  Refill  . aspirin 81 MG tablet   Oral   Take 81 mg by mouth daily.           Marland Kitchen atenolol (TENORMIN) 25 MG tablet   Oral   Take 1 tablet (25 mg total) by mouth daily.   30 tablet   0   . ferrous sulfate ([facility] IRON) 325 (65 FE) MG tablet   Oral   Take 325 mg by mouth daily.           . isosorbide mononitrate (ISMO,MONOKET) 10 MG tablet   Oral   Take 10 mg by mouth daily.         . Linaclotide (LINZESS) 145 MCG CAPS   Oral   Take 145 mcg by mouth daily.         Marland Kitchen losartan-hydrochlorothiazide (HYZAAR) 100-25 MG per tablet   Oral   Take  1 tablet by mouth daily.   30 tablet   0   . omeprazole (PRILOSEC) 20 MG capsule   Oral   Take 1 capsule (20 mg total) by mouth daily.   30 capsule   0   . simvastatin (ZOCOR) 40 MG tablet   Oral   Take 1 tablet (40 mg total) by mouth at bedtime.   30 tablet   0   . traZODone (DESYREL) 100 MG tablet   Oral   Take 1 tablet (100 mg total) by mouth at bedtime.   30 tablet   3    BP 105/58  Temp(Src) 98.5 F (36.9 C) (Oral)  Resp 16  SpO2 100%  Vital signs normal   Physical Exam  Nursing note and vitals reviewed. Constitutional: She is oriented to person, place, and time. She appears well-developed and well-nourished.  Non-toxic appearance. She does not appear ill. No distress.  HENT:  Head: Normocephalic and atraumatic.  Right Ear: External ear normal.  Left Ear: External ear normal.  Nose: Nose normal. No mucosal edema or rhinorrhea.  Mouth/Throat: Mucous membranes are normal.  No dental abscesses or edematous.  Dry mucous membranes, edentulous  Eyes: Conjunctivae and EOM are normal. Pupils are equal, round, and reactive to light.  Neck: Normal range of motion and full passive range of motion without pain. Neck supple.  Cardiovascular: Normal rate, regular rhythm and normal heart sounds.  Exam reveals no gallop and no friction rub.   No murmur heard. Pulmonary/Chest: Effort normal and breath sounds normal. No respiratory distress. She has no wheezes. She has no rhonchi. She has no rales. She exhibits no tenderness and no crepitus.  Abdominal: Soft. Normal appearance and bowel sounds are normal. She exhibits no distension. There is no tenderness. There is no rebound and no guarding.  Musculoskeletal: Normal range of motion. She exhibits no edema and no tenderness.  Moves all extremities well.   Patient is status post BKA on the left. She has an area on her left knee where a prior ulcer had been that is now healed. It is approximately 3 cm in size  Patient has a 4 cm ulcer over her right lateral malleolus that is through the dermis. It has dried rim. There is mild swelling and redness surrounding it. She is status post an occasion of her right little toe. That surgical site is well-healed   Neurological: She is alert and oriented to person, place, and time. She has normal strength. No cranial nerve deficit.  Skin: Skin is warm, dry and intact. No rash noted. No erythema. No pallor.  Psychiatric: She has a normal mood and affect. Her speech is normal and behavior is normal. Her mood appears not anxious.    ED Course      Procedures (including critical care time)  About 1949 nursing staff reports patient had acute onset of chest pain. However when I talked to her she denies having chest pain, her daughter however stated that she was complaining of chest pain. Patient told me she just felt very short of breath and "felt bad". EKG was done which showed normal sinus  rhythm with underlying bundle branch block At 2005 however she stated she was feeling better. She states she's never had this happen before, her daughter states she's never seen her have this before.  21:10 family shown her xrays, they are unaware of a specific event that would fracture her RLE, but state when they transfer her to the wheelchair her leg  gets dragged and gets dorsiflexed.   21:26 Dr Eulah Pont has reviewed her xrays, recommends a boot, wound care consult and he will consult   21:51 Dr Alvester Morin, will see and admit  Results for orders placed during the hospital encounter of 10/16/12  CBC WITH DIFFERENTIAL      Result Value Range   WBC 9.2  4.0 - 10.5 K/uL   RBC 3.75 (*) 3.87 - 5.11 MIL/uL   Hemoglobin 11.3 (*) 12.0 - 15.0 g/dL   HCT 40.9 (*) 81.1 - 91.4 %   MCV 88.8  78.0 - 100.0 fL   MCH 30.1  26.0 - 34.0 pg   MCHC 33.9  30.0 - 36.0 g/dL   RDW 78.2  95.6 - 21.3 %   Platelets 203  150 - 400 K/uL   Neutrophils Relative % 71  43 - 77 %   Neutro Abs 6.5  1.7 - 7.7 K/uL   Lymphocytes Relative 21  12 - 46 %   Lymphs Abs 1.9  0.7 - 4.0 K/uL   Monocytes Relative 8  3 - 12 %   Monocytes Absolute 0.7  0.1 - 1.0 K/uL   Eosinophils Relative 1  0 - 5 %   Eosinophils Absolute 0.1  0.0 - 0.7 K/uL   Basophils Relative 0  0 - 1 %   Basophils Absolute 0.0  0.0 - 0.1 K/uL  COMPREHENSIVE METABOLIC PANEL      Result Value Range   Sodium 131 (*) 135 - 145 mEq/L   Potassium 3.9  3.5 - 5.1 mEq/L   Chloride 96  96 - 112 mEq/L   CO2 21  19 - 32 mEq/L   Glucose, Bld 207 (*) 70 - 99 mg/dL   BUN 47 (*) 6 - 23 mg/dL   Creatinine, Ser 0.86 (*) 0.50 - 1.10 mg/dL   Calcium 8.9  8.4 - 57.8 mg/dL   Total Protein 6.4  6.0 - 8.3 g/dL   Albumin 2.5 (*) 3.5 - 5.2 g/dL   AST 14  0 - 37 U/L   ALT 6  0 - 35 U/L   Alkaline Phosphatase 69  39 - 117 U/L   Total Bilirubin 0.8  0.3 - 1.2 mg/dL   GFR calc non Af Amer 14 (*) >90 mL/min   GFR calc Af Amer 16 (*) >90 mL/min  APTT      Result Value Range    aPTT 31  24 - 37 seconds  PROTIME-INR      Result Value Range   Prothrombin Time 14.7  11.6 - 15.2 seconds   INR 1.17  0.00 - 1.49  TROPONIN I      Result Value Range   Troponin I <0.30  <0.30 ng/mL  D-DIMER, QUANTITATIVE      Result Value Range   D-Dimer, Quant 7.56 (*) 0.00 - 0.48 ug/mL-FEU  PRO B NATRIURETIC PEPTIDE      Result Value Range   Pro B Natriuretic peptide (BNP) 1528.0 (*) 0 - 450 pg/mL   Laboratory interpretation all normal except mild anemia, hyponatremia, hyperglycemia, renal insuffic, elevated Ddimer and BNP   Dg Chest 1 View  10/16/2012   *RADIOLOGY REPORT*  Clinical Data: Chest pain.  Weakness.  CHEST - 1 VIEW  Comparison: Chest x-ray 05/27/2004.  Findings: Lung volumes are normal.  No consolidative airspace disease.  No pleural effusions.  No pneumothorax.  No pulmonary nodule or mass noted.  Pulmonary vasculature and the cardiomediastinal silhouette are within normal limits. Atherosclerosis in  the thoracic aorta.  Status post median sternotomy for CABG.  Post traumatic deformity of the left humeral head and neck.  IMPRESSION: 1. No radiographic evidence of acute cardiopulmonary disease. 2.  Atherosclerosis.   Original Report Authenticated By: Trudie Reed, M.D.   Dg Ankle Complete Right  10/16/2012   *RADIOLOGY REPORT*  Clinical Data: Diabetic ulcer, lateral malleolus  RIGHT ANKLE - COMPLETE 3+ VIEW  Comparison: None.  Findings: Bones are osteopenic.  Soft tissue defect over the lateral malleolus compatible with ulceration.  There is underlying thinning of the cortex and subcortical bone loss of the lateral malleolus suspicious for early osteomyelitis.  There is also cortical step off on the oblique view compatible with a right distal fibula fracture, nondisplaced.  Distal tibia, talus and calcaneus appear intact.  Grossly normal alignment.  Peripheral vessel calcifications evident.  Right fifth toe amputation also present.  IMPRESSION: Lateral malleolar soft tissue  ulceration with underlying cortical thinning and subcortical bone loss of the lateral malleolus with an associated nondisplaced fracture.  Appearance is concerning for osteomyelitis.  Osteopenia  Peripheral atherosclerosis  Previous right fifth toe amputation   Original Report Authenticated By: Judie Petit. Miles Costain, M.D.     Date: 10/16/2012  Rate: 88  Rhythm: normal sinus rhythm  QRS Axis: normal  Intervals: normal  ST/T Wave abnormalities: nonspecific ST/T changes  Conduction Disutrbances:right bundle branch block, left anterior fascicular block and LVH with strain  Narrative Interpretation:   Old EKG Reviewed: unchanged from 05/27/2004    1. Decubitus ulcer, ankle, right, unspecified pressure ulcer stage   2. Closed fibular fracture, right, initial encounter   3. Hyperglycemia   4. Hyponatremia   5. Anemia   6. Malnutrition    Plan admission  Devoria Albe, MD, FACEP  MDM       Ward Givens, MD 10/16/12 2330

## 2012-10-16 NOTE — ED Notes (Signed)
Pt BIB [facility]. Pt has a diabetic ulcer on her R ankle which has been there for several months. Since Tuesday pt's ulcer has started bleeding and draining per family. Pt lives at home. Her MD told her to come to ED. Pt's family reports that recently her BPs have been low. Pt a/o x 3 with no acute distress.

## 2012-10-16 NOTE — ED Notes (Signed)
Patient alert and oriented x3.  Patient states that she is having sharp chest pain that just started.  EKG ordered and EDP notified.  Difficult to obtain a significant O2 sat.  Nonrebreather mask applied.

## 2012-10-16 NOTE — ED Notes (Signed)
MWU:[id number]<MW> Expected date:10/16/12<BR> Expected time: 6:33 PM<BR> Means of arrival:Ambulance<BR> Comments:<BR> Diabetic ulcer

## 2012-10-16 NOTE — ED Notes (Signed)
Patient transported to X-ray 

## 2012-10-17 ENCOUNTER — Inpatient Hospital Stay (HOSPITAL_COMMUNITY): Payer: Medicare Other

## 2012-10-17 ENCOUNTER — Other Ambulatory Visit (HOSPITAL_COMMUNITY): Payer: Medicare Other

## 2012-10-17 ENCOUNTER — Encounter (HOSPITAL_COMMUNITY): Payer: Self-pay | Admitting: *Deleted

## 2012-10-17 DIAGNOSIS — S82409A Unspecified fracture of shaft of unspecified fibula, initial encounter for closed fracture: Secondary | ICD-10-CM

## 2012-10-17 DIAGNOSIS — N189 Chronic kidney disease, unspecified: Secondary | ICD-10-CM

## 2012-10-17 DIAGNOSIS — R739 Hyperglycemia, unspecified: Secondary | ICD-10-CM

## 2012-10-17 DIAGNOSIS — E119 Type 2 diabetes mellitus without complications: Secondary | ICD-10-CM

## 2012-10-17 DIAGNOSIS — E46 Unspecified protein-calorie malnutrition: Secondary | ICD-10-CM

## 2012-10-17 DIAGNOSIS — E43 Unspecified severe protein-calorie malnutrition: Secondary | ICD-10-CM | POA: Insufficient documentation

## 2012-10-17 DIAGNOSIS — E871 Hypo-osmolality and hyponatremia: Secondary | ICD-10-CM

## 2012-10-17 DIAGNOSIS — R609 Edema, unspecified: Secondary | ICD-10-CM

## 2012-10-17 DIAGNOSIS — D649 Anemia, unspecified: Secondary | ICD-10-CM

## 2012-10-17 DIAGNOSIS — L89509 Pressure ulcer of unspecified ankle, unspecified stage: Secondary | ICD-10-CM

## 2012-10-17 DIAGNOSIS — N179 Acute kidney failure, unspecified: Secondary | ICD-10-CM

## 2012-10-17 LAB — BASIC METABOLIC PANEL
GFR calc Af Amer: 17 mL/min — ABNORMAL LOW (ref >90)
GFR calc non Af Amer: 15 mL/min — ABNORMAL LOW (ref >90)
Potassium: 3.6 mEq/L (ref 3.5–5.1)
Sodium: 131 mEq/L — ABNORMAL LOW (ref 135–145)

## 2012-10-17 LAB — GLUCOSE, CAPILLARY
Glucose-Capillary: 127 mg/dL — ABNORMAL HIGH (ref 70–99)
Glucose-Capillary: 129 mg/dL — ABNORMAL HIGH (ref 70–99)
Glucose-Capillary: 152 mg/dL — ABNORMAL HIGH (ref 70–99)
Glucose-Capillary: 159 mg/dL — ABNORMAL HIGH (ref 70–99)

## 2012-10-17 LAB — TROPONIN I
Troponin I: <0.3 ng/mL (ref <0.30)
Troponin I: <0.3 ng/mL (ref <0.30)

## 2012-10-17 LAB — MRSA PCR SCREENING: MRSA by PCR: NEGATIVE

## 2012-10-17 LAB — HEMOGLOBIN A1C: Mean Plasma Glucose: 143 mg/dL — ABNORMAL HIGH (ref <117)

## 2012-10-17 MED ORDER — INSULIN ASPART 100 UNIT/ML ~~LOC~~ SOLN
0.0000 [IU] | Freq: Three times a day (TID) | SUBCUTANEOUS | Status: DC
  Administered 2012-10-17: 1 [IU] via SUBCUTANEOUS
  Administered 2012-10-17 – 2012-10-20 (×5): 2 [IU] via SUBCUTANEOUS
  Administered 2012-10-21: 1 [IU] via SUBCUTANEOUS

## 2012-10-17 MED ORDER — ENOXAPARIN SODIUM 60 MG/0.6ML ~~LOC~~ SOLN
60.0000 mg | SUBCUTANEOUS | Status: DC
  Administered 2012-10-17 – 2012-10-18 (×3): 60 mg via SUBCUTANEOUS
  Filled 2012-10-17 (×3): qty 0.6

## 2012-10-17 MED ORDER — VANCOMYCIN HCL IN DEXTROSE 1-5 GM/200ML-% IV SOLN
1000.0000 mg | INTRAVENOUS | Status: DC
  Filled 2012-10-17: qty 200

## 2012-10-17 MED ORDER — ENSURE COMPLETE PO LIQD
237.0000 mL | Freq: Two times a day (BID) | ORAL | Status: DC
  Administered 2012-10-17 – 2012-10-21 (×4): 237 mL via ORAL

## 2012-10-17 MED ORDER — INSULIN ASPART 100 UNIT/ML ~~LOC~~ SOLN: 0.0000 [IU] | Freq: Every day | SUBCUTANEOUS | Status: DC

## 2012-10-17 MED ORDER — SODIUM CHLORIDE 0.9 % IV BOLUS (SEPSIS)
500.0000 mL | Freq: Once | INTRAVENOUS | Status: AC
  Administered 2012-10-17: 500 mL via INTRAVENOUS

## 2012-10-17 MED ORDER — WARFARIN VIDEO: Freq: Once | Status: DC

## 2012-10-17 MED ORDER — TECHNETIUM TC 99M DIETHYLENETRIAME-PENTAACETIC ACID: 43.9000 | Freq: Once | INTRAVENOUS | Status: AC | PRN

## 2012-10-17 MED ORDER — HYDROCODONE-ACETAMINOPHEN 5-325 MG PO TABS
1.0000 | ORAL_TABLET | ORAL | Status: DC | PRN
  Administered 2012-10-17 – 2012-10-20 (×6): 1 via ORAL
  Filled 2012-10-17 (×6): qty 1

## 2012-10-17 MED ORDER — PATIENT'S GUIDE TO USING COUMADIN BOOK
Freq: Once | Status: AC
  Administered 2012-10-17: 22:00:00
  Filled 2012-10-17: qty 1

## 2012-10-17 MED ORDER — CIPROFLOXACIN IN D5W 400 MG/200ML IV SOLN
400.0000 mg | Freq: Every day | INTRAVENOUS | Status: DC
  Administered 2012-10-17: 400 mg via INTRAVENOUS
  Filled 2012-10-17: qty 200

## 2012-10-17 MED ORDER — TECHNETIUM TO 99M ALBUMIN AGGREGATED
5.5000 | Freq: Once | INTRAVENOUS | Status: AC | PRN
  Administered 2012-10-17: 6 via INTRAVENOUS

## 2012-10-17 MED ORDER — ENOXAPARIN SODIUM 60 MG/0.6ML ~~LOC~~ SOLN
1.0000 mg/kg | SUBCUTANEOUS | Status: AC
  Administered 2012-10-17: 60 mg via SUBCUTANEOUS
  Filled 2012-10-17: qty 0.6

## 2012-10-17 NOTE — Progress Notes (Signed)
ANTICOAGULATION CONSULT NOTE - Initial Consult  Pharmacy Consult for Lovenox Indication: r/o VTE  No Known Allergies  Patient Measurements: Height: 5' 6.93" (170 cm) Weight: 134 lb 9.6 oz (61.054 kg) IBW/kg (Calculated) : 61.44 Heparin Dosing Weight:   Vital Signs: Temp: 97.7 F (36.5 C) (08/08 0542) Temp src: Oral (08/08 0542) BP: 79/44 mmHg (08/08 0542) Pulse Rate: 63 (08/08 0542)  Labs:  Recent Labs  10/16/12 2045 10/17/12 0043  HGB 11.3*  --   HCT 33.3*  --   PLT 203  --   APTT 31  --   LABPROT 14.7  --   INR 1.17  --   CREATININE 2.96*  --   TROPONINI <0.30 <0.30    Estimated Creatinine Clearance: 13.6 ml/min (by C-G formula based on Cr of 2.96).   Medical History: Past Medical History  Diagnosis Date  . ALLERGIC RHINITIS 12/30/2006  . ANEMIA, IRON DEFICIENCY 10/21/2009  . COLONIC POLYPS, HX OF 12/30/2006  . CORONARY ARTERY DISEASE 12/30/2006  . DIABETES MELLITUS, TYPE I 12/30/2006  . DIVERTICULOSIS, COLON 12/30/2006  . HYPERLIPIDEMIA 12/30/2006  . HYPERTENSION 12/30/2006  . PERIPHERAL NEUROPATHY 12/30/2006  . PERIPHERAL VASCULAR DISEASE 12/30/2006  . GOITER 12/30/2006  . VITAMIN B12 DEFICIENCY 09/16/2008  . Carpal tunnel syndrome 12/30/2006  . CATARACTS, BILATERAL 12/30/2006  . HEMORRHOIDS 12/30/2006  . DEGENERATIVE JOINT DISEASE 02/10/2007  . INSOMNIA 10/21/2009  . CORNEAL TRANSPLANT 12/30/2006  . BKA, LEFT LEG 12/30/2006    Medications:  Prescriptions prior to admission  Medication Sig Dispense Refill  . aspirin 81 MG tablet Take 81 mg by mouth daily.        Marland Kitchen atenolol (TENORMIN) 25 MG tablet Take 1 tablet (25 mg total) by mouth daily.  30 tablet  0  . ferrous sulfate ([facility] IRON) 325 (65 FE) MG tablet Take 325 mg by mouth daily.        . isosorbide mononitrate (ISMO,MONOKET) 10 MG tablet Take 10 mg by mouth daily.      . Linaclotide (LINZESS) 145 MCG CAPS Take 145 mcg by mouth daily.      Marland Kitchen losartan-hydrochlorothiazide (HYZAAR) 100-25 MG per  tablet Take 1 tablet by mouth daily.  30 tablet  0  . omeprazole (PRILOSEC) 20 MG capsule Take 1 capsule (20 mg total) by mouth daily.  30 capsule  0  . simvastatin (ZOCOR) 40 MG tablet Take 1 tablet (40 mg total) by mouth at bedtime.  30 tablet  0  . traZODone (DESYREL) 100 MG tablet Take 1 tablet (100 mg total) by mouth at bedtime.  30 tablet  3    Assessment: Patient with r/o VTE orders for lovenox sq x1.  Patient with very poor renal function <63mL/min. Every 24hr dosing is needed.  Goal of Therapy:  Anti-Xa level 0.6-1.2 units/ml 4hrs after LMWH dose given Monitor platelets by anticoagulation protocol: Yes   Plan:  Lovenox 60mg  sq x1 (given @0033  8/8) follow up with plan for anticoagulation after x1 dose  Aleene Davidson Crowford 10/17/2012,5:51 AM

## 2012-10-17 NOTE — Progress Notes (Addendum)
ANTICOAGULATION CONSULT NOTE - Initial Consult  Pharmacy Consult for:  Coumadin Indication:  Treatment of DVT  No Known Allergies  Patient Measurements: Height: 5' 6.93" (170 cm) Weight: 134 lb 9.6 oz (61.054 kg) IBW/kg (Calculated) : 61.44   Vital Signs: Temp: 97.5 F (36.4 C) (08/08 1453) Temp src: Oral (08/08 1453) BP: 90/52 mmHg (08/08 1500) Pulse Rate: 64 (08/08 1453)  Labs:  Recent Labs  10/16/12 2045 10/17/12 0043 10/17/12 0645 10/17/12 1140  HGB 11.3*  --   --   --   HCT 33.3*  --   --   --   PLT 203  --   --   --   APTT 31  --   --   --   LABPROT 14.7  --   --   --   INR 1.17  --   --   --   CREATININE 2.96*  --  2.75*  --   TROPONINI <0.30 <0.30 <0.30 <0.30    Estimated Creatinine Clearance: 14.7 ml/min (by C-G formula based on Cr of 2.75).   Medical History: Past Medical History  Diagnosis Date  . ALLERGIC RHINITIS 12/30/2006  . ANEMIA, IRON DEFICIENCY 10/21/2009  . COLONIC POLYPS, HX OF 12/30/2006  . CORONARY ARTERY DISEASE 12/30/2006  . DIABETES MELLITUS, TYPE I 12/30/2006  . DIVERTICULOSIS, COLON 12/30/2006  . HYPERLIPIDEMIA 12/30/2006  . HYPERTENSION 12/30/2006  . PERIPHERAL NEUROPATHY 12/30/2006  . PERIPHERAL VASCULAR DISEASE 12/30/2006  . GOITER 12/30/2006  . VITAMIN B12 DEFICIENCY 09/16/2008  . Carpal tunnel syndrome 12/30/2006  . CATARACTS, BILATERAL 12/30/2006  . HEMORRHOIDS 12/30/2006  . DEGENERATIVE JOINT DISEASE 02/10/2007  . INSOMNIA 10/21/2009  . CORNEAL TRANSPLANT 12/30/2006  . BKA, LEFT LEG 12/30/2006    Medications:  Scheduled:  . ciprofloxacin  400 mg Intravenous QHS  . enoxaparin (LOVENOX) injection  60 mg Subcutaneous Q24H  . feeding supplement  237 mL Oral BID BM  . ferrous sulfate  325 mg Oral Daily  . insulin aspart  0-5 Units Subcutaneous QHS  . insulin aspart  0-9 Units Subcutaneous TID WC  . Linaclotide  145 mcg Oral Daily  . pantoprazole  40 mg Oral Daily  . simvastatin  40 mg Oral QHS  . sodium chloride  500  mL Intravenous Once  . sodium chloride  3 mL Intravenous Q12H  . traZODone  100 mg Oral QHS  . [START ON 10/18/2012] vancomycin  1,000 mg Intravenous Q48H    Assessment:  Asked to assist with Coumadin therapy for this 79 year-old AA female with DVT of right LE, currently receiving Lovenox.  Today's VQ scan result is very low probability of pulmonary embolism.  Note that VTE diagnosis requires a minimum of 5-day overlap of Coumadin with Lovenox.  The INR drawn 10/16/12 was reported as 1.17.  Goal of Therapy:  INR 2-3   Plan:   Begin with conservative dose due to advanced age - 3 mg today, day 1 of 5-day overlap..  Follow PT/INR daily.  Coumadin education for patient and/or family  Zella Ball.Ph. 10/17/2012 7:03 PM

## 2012-10-17 NOTE — Progress Notes (Addendum)
INITIAL NUTRITION ASSESSMENT  Pt meets criteria for severe MALNUTRITION in the context of chronic illness as evidenced by <75% estimated energy intake with 23.4% weight loss in the past 4 months per daughter's report.  DOCUMENTATION CODES Per approved criteria  -Severe malnutrition in the context of chronic illness   INTERVENTION: - Ensure Complete BID - Educated daughter on high calorie/protein diet and provided handouts of this information - Will continue to monitor   NUTRITION DIAGNOSIS: Inadequate oral intake related to poor appetite as evidenced by <50% meal intake.   Goal: Pt to consume >90% of meals/supplements  Monitor:  Weights, labs, intake  Reason for Assessment: Nutrition risk   79 y.o. female  Admitting Dx: Hyponatremia  ASSESSMENT: Pt with history of type 1 DM, was taken off insulin secondary to recurrent hypoglycemia 6 months ago. Pt with history of CKD, peripheral vascular disease s/p left BKA, presenting with right lateral malleolar osteomyelitis.   Pt was out of room during RD visit, daughter in room. Reports pt with poor appetite since being taken off insulin. States pt eats 3 meals/day PTA but only a few bites at each meal. Reports 41 pound unintended weight loss in the past 4 months. Daughter has been giving pt Ensure mixed with ice cream or yogurt, however states pt will not consume more than 4 ounces of this at a time. States pt has dentures but does not wear them so she needs soft foods.   Pt with low sodium, elevated BUN/Cr with low GFR, CBG elevated this morning.    Lab Results  Component Value Date   HGBA1C 6.6* 10/17/2012    Height: Ht Readings from Last 1 Encounters:  10/17/12 5' 6.93" (1.7 m)    Weight: Wt Readings from Last 1 Encounters:  10/16/12 134 lb 9.6 oz (61.054 kg)    Ideal Body Weight: 127 lb (adjusted for left BKA)  % Ideal Body Weight: 106%  Wt Readings from Last 10 Encounters:  10/16/12 134 lb 9.6 oz (61.054 kg)   11/23/11 174 lb (78.926 kg)  09/22/11 179 lb 12.8 oz (81.557 kg)  12/07/10 184 lb 12.8 oz (83.825 kg)  10/13/10 188 lb 1.9 oz (85.331 kg)  12/12/09 236 lb 9.6 oz (107.321 kg)  10/21/09 192 lb (87.091 kg)  09/16/08 203 lb (92.08 kg)  04/29/08 199 lb (90.266 kg)  02/27/07 212 lb 9.6 oz (96.435 kg)    Usual Body Weight: 175 lb in April 2014 per daughter  % Usual Body Weight: 76%  BMI:  Body mass index is 21.13 kg/(m^2).  Estimated Nutritional Needs: Kcal: 1550-1850 Protein: 75-85g Fluid: 1.5-1.8L/day  Skin: diabetic ulcer on right ankle   Diet Order: Carb Control  EDUCATION NEEDS: -Education needs addressed - discussed high calorie/protein diet with daughter and provided handout of this information    Intake/Output Summary (Last 24 hours) at 10/17/12 1404 Last data filed at 10/17/12 1300  Gross per 24 hour  Intake 1674.17 ml  Output      0 ml  Net 1674.17 ml    Last BM: PTA  Labs:   Recent Labs Lab 10/16/12 2045 10/17/12 0645  NA 131* 131*  K 3.9 3.6  CL 96 98  CO2 21 22  BUN 47* 46*  CREATININE 2.96* 2.75*  CALCIUM 8.9 8.2*  GLUCOSE 207* 190*    CBG (last 3)   Recent Labs  10/17/12 0044  GLUCAP 152*    Scheduled Meds: . aspirin EC  81 mg Oral Daily  . ciprofloxacin  400 mg Intravenous QHS  . enoxaparin (LOVENOX) injection  60 mg Subcutaneous Q24H  . ferrous sulfate  325 mg Oral Daily  . insulin aspart  0-5 Units Subcutaneous QHS  . insulin aspart  0-9 Units Subcutaneous TID WC  . isosorbide mononitrate  10 mg Oral Daily  . Linaclotide  145 mcg Oral Daily  . pantoprazole  40 mg Oral Daily  . simvastatin  40 mg Oral QHS  . sodium chloride  500 mL Intravenous Once  . sodium chloride  3 mL Intravenous Q12H  . traZODone  100 mg Oral QHS  . [START ON 10/18/2012] vancomycin  1,000 mg Intravenous Q48H    Continuous Infusions: . sodium chloride 50 mL/hr at 10/17/12 0815    Past Medical History  Diagnosis Date  . ALLERGIC RHINITIS 12/30/2006   . ANEMIA, IRON DEFICIENCY 10/21/2009  . COLONIC POLYPS, HX OF 12/30/2006  . CORONARY ARTERY DISEASE 12/30/2006  . DIABETES MELLITUS, TYPE I 12/30/2006  . DIVERTICULOSIS, COLON 12/30/2006  . HYPERLIPIDEMIA 12/30/2006  . HYPERTENSION 12/30/2006  . PERIPHERAL NEUROPATHY 12/30/2006  . PERIPHERAL VASCULAR DISEASE 12/30/2006  . GOITER 12/30/2006  . VITAMIN B12 DEFICIENCY 09/16/2008  . Carpal tunnel syndrome 12/30/2006  . CATARACTS, BILATERAL 12/30/2006  . HEMORRHOIDS 12/30/2006  . DEGENERATIVE JOINT DISEASE 02/10/2007  . INSOMNIA 10/21/2009  . CORNEAL TRANSPLANT 12/30/2006  . BKA, LEFT LEG 12/30/2006    Past Surgical History  Procedure Laterality Date  . Appendectomy    . Cholecystectomy    . Abdominal hysterectomy  1960's    and bso  . Below knee leg amputation  [address] [place], [place], [place] [phone] Pager [phone] After Hours Pager

## 2012-10-17 NOTE — Progress Notes (Signed)
ANTIBIOTIC CONSULT NOTE - INITIAL  Pharmacy Consult for vancomycin, ciprofloxacin Indication: Osteomyelitis  No Known Allergies  Patient Measurements: Height: 5' 6.93" (170 cm) Weight: 134 lb 9.6 oz (61.054 kg) IBW/kg (Calculated) : 61.44 Adjusted Body Weight:   Vital Signs: Temp: 97.7 F (36.5 C) (08/08 0542) Temp src: Oral (08/08 0542) BP: 79/44 mmHg (08/08 0542) Pulse Rate: 63 (08/08 0542) Intake/Output from previous day:   Intake/Output from this shift:    Labs:  Recent Labs  10/16/12 2045  WBC 9.2  HGB 11.3*  PLT 203  CREATININE 2.96*   Estimated Creatinine Clearance: 13.6 ml/min (by C-G formula based on Cr of 2.96). No results found for this basename: VANCOTROUGH, Leodis Binet, VANCORANDOM, GENTTROUGH, GENTPEAK, GENTRANDOM, TOBRATROUGH, TOBRAPEAK, TOBRARND, AMIKACINPEAK, AMIKACINTROU, AMIKACIN,  in the last 72 hours   Microbiology: Recent Results (from the past 720 hour(s))  MRSA PCR SCREENING     Status: None   Collection Time    10/17/12  2:34 AM      Result Value Range Status   MRSA by PCR NEGATIVE  NEGATIVE Final   Comment:            The GeneXpert MRSA Assay (FDA     approved for NASAL specimens     only), is one component of a     comprehensive MRSA colonization     surveillance program. It is not     intended to diagnose MRSA     infection nor to guide or     monitor treatment for     MRSA infections.    Medical History: Past Medical History  Diagnosis Date  . ALLERGIC RHINITIS 12/30/2006  . ANEMIA, IRON DEFICIENCY 10/21/2009  . COLONIC POLYPS, HX OF 12/30/2006  . CORONARY ARTERY DISEASE 12/30/2006  . DIABETES MELLITUS, TYPE I 12/30/2006  . DIVERTICULOSIS, COLON 12/30/2006  . HYPERLIPIDEMIA 12/30/2006  . HYPERTENSION 12/30/2006  . PERIPHERAL NEUROPATHY 12/30/2006  . PERIPHERAL VASCULAR DISEASE 12/30/2006  . GOITER 12/30/2006  . VITAMIN B12 DEFICIENCY 09/16/2008  . Carpal tunnel syndrome 12/30/2006  . CATARACTS, BILATERAL 12/30/2006  .  HEMORRHOIDS 12/30/2006  . DEGENERATIVE JOINT DISEASE 02/10/2007  . INSOMNIA 10/21/2009  . CORNEAL TRANSPLANT 12/30/2006  . BKA, LEFT LEG 12/30/2006    Medications:  Anti-infectives   Start     Dose/Rate Route Frequency Ordered Stop   10/18/12 2200  vancomycin (VANCOCIN) IVPB 1000 mg/200 mL premix     1,000 mg 200 mL/hr over 60 Minutes Intravenous Every 48 hours 10/17/12 0548     10/17/12 2200  ciprofloxacin (CIPRO) IVPB 400 mg     400 mg 200 mL/hr over 60 Minutes Intravenous Daily at bedtime 10/17/12 0548     10/16/12 2300  vancomycin (VANCOCIN) IVPB 1000 mg/200 mL premix     1,000 mg 200 mL/hr over 60 Minutes Intravenous  Once 10/16/12 2257 10/17/12 0323   10/16/12 2300  ciprofloxacin (CIPRO) IVPB 400 mg     400 mg 200 mL/hr over 60 Minutes Intravenous  Once 10/16/12 2257 10/17/12 0055     Assessment: Patient with Osteomyelitis and First dose of antibiotics already given in ED.  Patient with very poor renal function <20 ml/min.  Goal of Therapy:  Vancomycin trough level 15-20 mcg/ml Ciprofloxacin dosed based on patient weight and renal function   Plan:  Measure antibiotic drug levels at steady state Follow up culture results Vancomycin 1gm iv q48hr (next dose 8/9 2200) Cipro 400mg  iv q24hr  Darlina Guys, Jacquenette Shone Crowford 10/17/2012,5:48 AM

## 2012-10-17 NOTE — Consult Note (Signed)
WOC consult: Osteomyelitis is beyond [facility] scope of practice. Please refer to ortho team for assessment and plan of care. Thank-you, Tarry Fountain [place], CWOCN

## 2012-10-17 NOTE — Progress Notes (Signed)
Right lower extremity venous duplex completed.  Right:  DVT noted in the common femoral vein and saphenofemoral junction.  Partial DVT in the popliteal vein.  There appears to be superficial thrombus in the proximal greater saphenous vein.  No Baker's cyst.  Left:  Negative for DVT in the common femoral and femoral vein.

## 2012-10-17 NOTE — Consult Note (Signed)
ORTHOPAEDIC CONSULTATION  REQUESTING PHYSICIAN: Alba Cory, MD  Chief Complaint: R ankle fracture and pressure wound  HPI: Angelica Lawrence is a 79 y.o. female who complains of  Pressure sore over lateral ankle. She does not recall a fall.   Past Medical History  Diagnosis Date  . ALLERGIC RHINITIS 12/30/2006  . ANEMIA, IRON DEFICIENCY 10/21/2009  . COLONIC POLYPS, HX OF 12/30/2006  . CORONARY ARTERY DISEASE 12/30/2006  . DIABETES MELLITUS, TYPE I 12/30/2006  . DIVERTICULOSIS, COLON 12/30/2006  . HYPERLIPIDEMIA 12/30/2006  . HYPERTENSION 12/30/2006  . PERIPHERAL NEUROPATHY 12/30/2006  . PERIPHERAL VASCULAR DISEASE 12/30/2006  . GOITER 12/30/2006  . VITAMIN B12 DEFICIENCY 09/16/2008  . Carpal tunnel syndrome 12/30/2006  . CATARACTS, BILATERAL 12/30/2006  . HEMORRHOIDS 12/30/2006  . DEGENERATIVE JOINT DISEASE 02/10/2007  . INSOMNIA 10/21/2009  . CORNEAL TRANSPLANT 12/30/2006  . BKA, LEFT LEG 12/30/2006   Past Surgical History  Procedure Laterality Date  . Appendectomy    . Cholecystectomy    . Abdominal hysterectomy  1960's    and bso  . Below knee leg amputation  1989    L   History   Social History  . Marital Status: Widowed    Spouse Name: N/A    Number of Children: N/A  . Years of Education: N/A   Occupational History  . Retired    Social History Main Topics  . Smoking status: Never Smoker   . Smokeless tobacco: None  . Alcohol Use: No  . Drug Use: No  . Sexually Active: None   Other Topics Concern  . None   Social History Narrative  . None   Family History  Problem Relation Age of Onset  . Cancer Neg Hx   . Heart disease Neg Hx    No Known Allergies Prior to Admission medications   Medication Sig Start Date End Date Taking? Authorizing Provider  aspirin 81 MG tablet Take 81 mg by mouth daily.     Yes Historical Provider, MD  atenolol (TENORMIN) 25 MG tablet Take 1 tablet (25 mg total) by mouth daily. 08/18/12  Yes [name], MD    ferrous sulfate ([facility] IRON) 325 (65 FE) MG tablet Take 325 mg by mouth daily.     Yes Historical Provider, MD  isosorbide mononitrate (ISMO,MONOKET) 10 MG tablet Take 10 mg by mouth daily.   Yes Historical Provider, MD  Linaclotide (LINZESS) 145 MCG CAPS Take 145 mcg by mouth daily.   Yes Historical Provider, MD  losartan-hydrochlorothiazide (HYZAAR) 100-25 MG per tablet Take 1 tablet by mouth daily. 08/18/12  Yes [name], MD  omeprazole (PRILOSEC) 20 MG capsule Take 1 capsule (20 mg total) by mouth daily. 08/18/12  Yes [name], MD  simvastatin (ZOCOR) 40 MG tablet Take 1 tablet (40 mg total) by mouth at bedtime. 08/18/12  Yes [name], MD  traZODone (DESYREL) 100 MG tablet Take 1 tablet (100 mg total) by mouth at bedtime. 04/17/12  Yes [name], MD   Dg Chest 1 View  10/16/2012   *RADIOLOGY REPORT*  Clinical Data: Chest pain.  Weakness.  CHEST - 1 VIEW  Comparison: Chest x-ray 05/27/2004.  Findings: Lung volumes are normal.  No consolidative airspace disease.  No pleural effusions.  No pneumothorax.  No pulmonary nodule or mass noted.  Pulmonary vasculature and the cardiomediastinal silhouette are within normal limits. Atherosclerosis in the thoracic aorta.  Status post median sternotomy for CABG.  Post traumatic deformity of the left humeral  head and neck.  IMPRESSION: 1. No radiographic evidence of acute cardiopulmonary disease. 2.  Atherosclerosis.   Original Report Authenticated By: Trudie Reed, M.D.   Dg Ankle Complete Right  10/16/2012   *RADIOLOGY REPORT*  Clinical Data: Diabetic ulcer, lateral malleolus  RIGHT ANKLE - COMPLETE 3+ VIEW  Comparison: None.  Findings: Bones are osteopenic.  Soft tissue defect over the lateral malleolus compatible with ulceration.  There is underlying thinning of the cortex and subcortical bone loss of the lateral malleolus suspicious for early osteomyelitis.  There is also cortical step off on the oblique view compatible with a right distal fibula  fracture, nondisplaced.  Distal tibia, talus and calcaneus appear intact.  Grossly normal alignment.  Peripheral vessel calcifications evident.  Right fifth toe amputation also present.  IMPRESSION: Lateral malleolar soft tissue ulceration with underlying cortical thinning and subcortical bone loss of the lateral malleolus with an associated nondisplaced fracture.  Appearance is concerning for osteomyelitis.  Osteopenia  Peripheral atherosclerosis  Previous right fifth toe amputation   Original Report Authenticated By: Judie Petit. Shick, M.D.   Nm Pulmonary Perf And Vent  10/17/2012   *RADIOLOGY REPORT*  Clinical Data: Elevated D-dimer.  Pulmonary embolism.  NM PULMONARY VENTILATION AND PERFUSION SCAN  Radiopharmaceutical: CURIE MAA TECHNETIUM TO 38M ALBUMIN AGGREGATED 43.9 mCi technetium 99 DTPA .  Comparison: Chest radiograph 10/16/2012.  Findings: The patient was unable to raise arms over head.  This degrades the lateral views on this study.  There are no segmental perfusion defects.  Mildly heterogeneous perfusion is present in the right lung.  The similar changes are present in the left upper lobe on the anterior view.  Radiotracer is present in the trachea on the ventilation study.  No corresponding ventilation defects are identified.  IMPRESSION: Very low probability of pulmonary embolism (0% to 9%).   Original Report Authenticated By: Andreas Newport, M.D.    Positive ROS: All other systems have been reviewed and were otherwise negative with the exception of those mentioned in the HPI and as above.  Physical Exam: Filed Vitals:   10/17/12 1500  BP: 90/52  Pulse:   Temp:   Resp:    General: Alert, no acute distress Cardiovascular: No pedal edema Respiratory: No cyanosis, no use of accessory musculature GI: No organomegaly, abdomen is soft and non-tender Skin: No lesions in the area of chief complaint Neurologic: Sensation intact distally Psychiatric: oriented to self and place Lymphatic: No  axillary or cervical lymphadenopathy  MUSCULOSKELETAL:  RLE: no TTP, decreased sensation globally. Wiggles toes, no TA. 1+DP. 3cm Lateral ankle wound with fibrinous base, no active drainiage, no visible deep structures. Crepitous at Lateral mal Other extremities are atraumatic with painless ROM and NVI.  Assessment: Lat mal ankle fracture minimally displaced, age undetermined, Lateral ankle wound  Plan: Non op care of fracture and wound for now. Preassure realief dressing and Boot for foot Wound care c/s for care and f/u as outpatient Nutrition optimization Weight Bearing Status: NWB RLE PT/OT VTE px: SCD's and no contraindication to Chemical px. F/u with me in 2 wks for re-eval of fracture. F/u with wound care team for dressing management.   Margarita Rana, D, MD Cell [phone]   10/17/2012 5:01 PM

## 2012-10-17 NOTE — Progress Notes (Signed)
Positive for dvt in left leg Dr Sunnie Nielsen informed and pharm here and informed.

## 2012-10-17 NOTE — Progress Notes (Signed)
[facility] PROGRESS NOTE  Angelica Lawrence ZOX:[id number] DOB: 27-Oct-1926 DOA: 10/16/2012 PCP: [name], MD  Assessment/Plan: 1-R lateral malleolar osteomyelitis/fracture: CAM walker at bedside per ortho. Continue with  vanc and cipro for MRSA and gram negative coverage in setting of DM. Ortho recommend medical management. Will ask ID consult in am.   2-Elevated d dimer: Doppler positive for DVT right LE. Continue with Lovenox. If no surgery planned will start  coumadin per pharmacy. VQ scan low probability.  3-Anemia: likely anemia of chronic disease. Stable. Continue to follow.  4-Acute on CKD: Baseline Cr 1.2-1.7. Cr on admission at 2.9. Continue with IV fluids. Avoid hypotension.  5-Hypotension : in setting of infection. Respond to IV fluids. Monitor. Hold nitrates.   Code Status: DNR Family Communication: none at bedside.  Disposition Plan: to be determine   Consultants:  Dr Eulah Pont  Procedures:  Doppler Right Lower extremity positive for DVT  Antibiotics:  Vancomycin 8-7  Ciprofloxacin 8-7  HPI/Subjective: Feeling well, tolerating diet. No pain on right LE.   Objective: Filed Vitals:   10/17/12 1500  BP: 90/52  Pulse:   Temp:   Resp:     Intake/Output Summary (Last 24 hours) at 10/17/12 1756 Last data filed at 10/17/12 1724  Gross per 24 hour  Intake 1674.17 ml  Output      2 ml  Net 1672.17 ml   Filed Weights   10/16/12 2325  Weight: 61.054 kg (134 lb 9.6 oz)    Exam:   General:  No distress.   Cardiovascular: S 1, S 2 RRR  Respiratory: CTA  Abdomen: Bs present, soft, NT  Musculoskeletal: Right LE with malleolar ulcer, redness.   Data Reviewed: Basic Metabolic Panel:  Recent Labs Lab 10/16/12 2045 10/17/12 0645  NA 131* 131*  K 3.9 3.6  CL 96 98  CO2 21 22  GLUCOSE 207* 190*  BUN 47* 46*  CREATININE 2.96* 2.75*  CALCIUM 8.9 8.2*   Liver Function Tests:  Recent Labs Lab 10/16/12 2045  AST 14  ALT 6  ALKPHOS 69   BILITOT 0.8  PROT 6.4  ALBUMIN 2.5*   No results found for this basename: LIPASE, AMYLASE,  in the last 168 hours No results found for this basename: AMMONIA,  in the last 168 hours CBC:  Recent Labs Lab 10/16/12 2045  WBC 9.2  NEUTROABS 6.5  HGB 11.3*  HCT 33.3*  MCV 88.8  PLT 203   Cardiac Enzymes:  Recent Labs Lab 10/16/12 2045 10/17/12 0043 10/17/12 0645 10/17/12 1140  TROPONINI <0.30 <0.30 <0.30 <0.30   BNP (last 3 results)  Recent Labs  10/16/12 2045  PROBNP 1528.0*   CBG:  Recent Labs Lab 10/17/12 0044 10/17/12 0808 10/17/12 1156 10/17/12 1653  GLUCAP 152* 184* 159* 129*    Recent Results (from the past 240 hour(s))  MRSA PCR SCREENING     Status: None   Collection Time    10/17/12  2:34 AM      Result Value Range Status   MRSA by PCR NEGATIVE  NEGATIVE Final   Comment:            The GeneXpert MRSA Assay (FDA     approved for NASAL specimens     only), is one component of a     comprehensive MRSA colonization     surveillance program. It is not     intended to diagnose MRSA     infection nor to guide or  monitor treatment for     MRSA infections.     Studies: Dg Chest 1 View  10/16/2012   *RADIOLOGY REPORT*  Clinical Data: Chest pain.  Weakness.  CHEST - 1 VIEW  Comparison: Chest x-ray 05/27/2004.  Findings: Lung volumes are normal.  No consolidative airspace disease.  No pleural effusions.  No pneumothorax.  No pulmonary nodule or mass noted.  Pulmonary vasculature and the cardiomediastinal silhouette are within normal limits. Atherosclerosis in the thoracic aorta.  Status post median sternotomy for CABG.  Post traumatic deformity of the left humeral head and neck.  IMPRESSION: 1. No radiographic evidence of acute cardiopulmonary disease. 2.  Atherosclerosis.   Original Report Authenticated By: Trudie Reed, M.D.   Dg Ankle Complete Right  10/16/2012   *RADIOLOGY REPORT*  Clinical Data: Diabetic ulcer, lateral malleolus  RIGHT ANKLE  - COMPLETE 3+ VIEW  Comparison: None.  Findings: Bones are osteopenic.  Soft tissue defect over the lateral malleolus compatible with ulceration.  There is underlying thinning of the cortex and subcortical bone loss of the lateral malleolus suspicious for early osteomyelitis.  There is also cortical step off on the oblique view compatible with a right distal fibula fracture, nondisplaced.  Distal tibia, talus and calcaneus appear intact.  Grossly normal alignment.  Peripheral vessel calcifications evident.  Right fifth toe amputation also present.  IMPRESSION: Lateral malleolar soft tissue ulceration with underlying cortical thinning and subcortical bone loss of the lateral malleolus with an associated nondisplaced fracture.  Appearance is concerning for osteomyelitis.  Osteopenia  Peripheral atherosclerosis  Previous right fifth toe amputation   Original Report Authenticated By: Judie Petit. Shick, M.D.   Nm Pulmonary Perf And Vent  10/17/2012   *RADIOLOGY REPORT*  Clinical Data: Elevated D-dimer.  Pulmonary embolism.  NM PULMONARY VENTILATION AND PERFUSION SCAN  Radiopharmaceutical: CURIE MAA TECHNETIUM TO 48M ALBUMIN AGGREGATED 43.9 mCi technetium 99 DTPA .  Comparison: Chest radiograph 10/16/2012.  Findings: The patient was unable to raise arms over head.  This degrades the lateral views on this study.  There are no segmental perfusion defects.  Mildly heterogeneous perfusion is present in the right lung.  The similar changes are present in the left upper lobe on the anterior view.  Radiotracer is present in the trachea on the ventilation study.  No corresponding ventilation defects are identified.  IMPRESSION: Very low probability of pulmonary embolism (0% to 9%).   Original Report Authenticated By: Andreas Newport, M.D.    Scheduled Meds: . aspirin EC  81 mg Oral Daily  . ciprofloxacin  400 mg Intravenous QHS  . enoxaparin (LOVENOX) injection  60 mg Subcutaneous Q24H  . feeding supplement  237 mL Oral BID  BM  . ferrous sulfate  325 mg Oral Daily  . insulin aspart  0-5 Units Subcutaneous QHS  . insulin aspart  0-9 Units Subcutaneous TID WC  . isosorbide mononitrate  10 mg Oral Daily  . Linaclotide  145 mcg Oral Daily  . pantoprazole  40 mg Oral Daily  . simvastatin  40 mg Oral QHS  . sodium chloride  500 mL Intravenous Once  . sodium chloride  3 mL Intravenous Q12H  . traZODone  100 mg Oral QHS  . [START ON 10/18/2012] vancomycin  1,000 mg Intravenous Q48H   Continuous Infusions: . sodium chloride 50 mL/hr at 10/17/12 0815    Principal Problem:   Hyponatremia Active Problems:   Anemia   Closed fibular fracture   Decubitus ulcer, ankle   Hyperglycemia  Malnutrition   Protein-calorie malnutrition, severe    Time spent: 35 minutes.     Alexandre Faries  [facility] Pager [phone]. If 7PM-7AM, please contact night-coverage at [email or web address], password [email or web address] 10/17/2012, 5:56 PM  LOS: 1 day

## 2012-10-17 NOTE — Progress Notes (Signed)
PHARMACY - LOVENOX  See previous Pharmacy note from 05:50 this am for full details.   Results of doppler noted: +DVT in right leg. Given one dose of Lovenox 1mg /kg around midnight, now to continue given +DVT.  Plan:  Since CrCl is < 58ml/min, will continue with Lovenox 60mg  sq q24h.  Follow up plans for long-term anticoagulation.  Loralee Pacas, PharmD, BCPS Pager: [phone] 10/17/2012 9:36 AM

## 2012-10-18 DIAGNOSIS — M869 Osteomyelitis, unspecified: Secondary | ICD-10-CM

## 2012-10-18 DIAGNOSIS — L97309 Non-pressure chronic ulcer of unspecified ankle with unspecified severity: Secondary | ICD-10-CM

## 2012-10-18 DIAGNOSIS — E1169 Type 2 diabetes mellitus with other specified complication: Secondary | ICD-10-CM

## 2012-10-18 DIAGNOSIS — N189 Chronic kidney disease, unspecified: Secondary | ICD-10-CM

## 2012-10-18 DIAGNOSIS — M9080 Osteopathy in diseases classified elsewhere, unspecified site: Secondary | ICD-10-CM

## 2012-10-18 DIAGNOSIS — N179 Acute kidney failure, unspecified: Secondary | ICD-10-CM

## 2012-10-18 LAB — GLUCOSE, CAPILLARY: Glucose-Capillary: 137 mg/dL — ABNORMAL HIGH (ref 70–99)

## 2012-10-18 LAB — CBC
HCT: 29.5 % — ABNORMAL LOW (ref 36.0–46.0)
Hemoglobin: 9.8 g/dL — ABNORMAL LOW (ref 12.0–15.0)
MCHC: 33.2 g/dL (ref 30.0–36.0)
MCV: 89.9 fL (ref 78.0–100.0)
RDW: 13.7 % (ref 11.5–15.5)

## 2012-10-18 LAB — BASIC METABOLIC PANEL
BUN: 40 mg/dL — ABNORMAL HIGH (ref 6–23)
Creatinine, Ser: 2.02 mg/dL — ABNORMAL HIGH (ref 0.50–1.10)
GFR calc Af Amer: 25 mL/min — ABNORMAL LOW (ref >90)
GFR calc non Af Amer: 21 mL/min — ABNORMAL LOW (ref >90)
Glucose, Bld: 151 mg/dL — ABNORMAL HIGH (ref 70–99)

## 2012-10-18 MED ORDER — WARFARIN SODIUM 5 MG PO TABS
5.0000 mg | ORAL_TABLET | Freq: Once | ORAL | Status: AC
  Administered 2012-10-18: 5 mg via ORAL
  Filled 2012-10-18: qty 1

## 2012-10-18 MED ORDER — VANCOMYCIN HCL IN DEXTROSE 750-5 MG/150ML-% IV SOLN
750.0000 mg | INTRAVENOUS | Status: DC
  Administered 2012-10-18 – 2012-10-21 (×4): 750 mg via INTRAVENOUS
  Filled 2012-10-18 (×4): qty 150

## 2012-10-18 MED ORDER — DEXTROSE 5 % IV SOLN
1.0000 g | INTRAVENOUS | Status: DC
  Administered 2012-10-18 – 2012-10-21 (×4): 1 g via INTRAVENOUS
  Filled 2012-10-18 (×4): qty 1

## 2012-10-18 MED ORDER — WARFARIN - PHARMACIST DOSING INPATIENT: Freq: Every day | Status: DC

## 2012-10-18 NOTE — Consult Note (Addendum)
[facility]    Date of Admission:  10/16/2012  Date of Consult:  10/18/2012  Reason for Consult:R lateral malleolar osteomyelitis/fracture  Referring Physician: Dr. Sunnie Nielsen   HPI: Angelica Lawrence is an 79 y.o. female DM, CKD, peripheral vascular disease status post left BKA,presenting with right lateral malleolar osteomyelitis. Per family, patient to see her PCP earlier in the week and he noticed a right lateral malleolus ulcer. Affected area seemed to progressively worsen over the course of the week albeit mild. She had films that showed:  IMPRESSION:  Lateral malleolar soft tissue ulceration with underlying cortical  thinning and subcortical bone loss of the lateral malleolus with an  associated nondisplaced fracture. Appearance is concerning for  osteomyelitis.  Dr. Eulah Pont saw the patient and recommended local wound care and antibiotics. The patient has also been found to have a DVT in this side.       Past Medical History  Diagnosis Date  . ALLERGIC RHINITIS 12/30/2006  . ANEMIA, IRON DEFICIENCY 10/21/2009  . COLONIC POLYPS, HX OF 12/30/2006  . CORONARY ARTERY DISEASE 12/30/2006  . DIABETES MELLITUS, TYPE I 12/30/2006  . DIVERTICULOSIS, COLON 12/30/2006  . HYPERLIPIDEMIA 12/30/2006  . HYPERTENSION 12/30/2006  . PERIPHERAL NEUROPATHY 12/30/2006  . PERIPHERAL VASCULAR DISEASE 12/30/2006  . GOITER 12/30/2006  . VITAMIN B12 DEFICIENCY 09/16/2008  . Carpal tunnel syndrome 12/30/2006  . CATARACTS, BILATERAL 12/30/2006  . HEMORRHOIDS 12/30/2006  . DEGENERATIVE JOINT DISEASE 02/10/2007  . INSOMNIA 10/21/2009  . CORNEAL TRANSPLANT 12/30/2006  . BKA, LEFT LEG 12/30/2006    Past Surgical History  Procedure Laterality Date  . Appendectomy    . Cholecystectomy    . Abdominal hysterectomy  1960's    and bso  . Below knee leg amputation  1989    L  ergies:   No Known Allergies   Medications: I have reviewed patients current medications as  documented in Epic Anti-infectives   Start     Dose/Rate Route Frequency Ordered Stop   10/18/12 1400  [MAR Hold]  vancomycin (VANCOCIN) IVPB 1000 mg/200 mL premix  Status:  Discontinued     (On MAR Hold since 10/18/12 0733)   1,000 mg 200 mL/hr over 60 Minutes Intravenous Every 48 hours 10/17/12 0548 10/18/12 1200   10/18/12 1300  ceFEPIme (MAXIPIME) 1 g in dextrose 5 % 50 mL IVPB     1 g 100 mL/hr over 30 Minutes Intravenous Every 24 hours 10/18/12 1158     10/18/12 1230  vancomycin (VANCOCIN) IVPB 750 mg/150 ml premix     750 mg 150 mL/hr over 60 Minutes Intravenous Every 24 hours 10/18/12 1201     10/17/12 2200  [MAR Hold]  ciprofloxacin (CIPRO) IVPB 400 mg  Status:  Discontinued     (On MAR Hold since 10/18/12 0733)   400 mg 200 mL/hr over 60 Minutes Intravenous Daily at bedtime 10/17/12 0548 10/18/12 1059   10/16/12 2300  vancomycin (VANCOCIN) IVPB 1000 mg/200 mL premix     1,000 mg 200 mL/hr over 60 Minutes Intravenous  Once 10/16/12 2257 10/17/12 0323   10/16/12 2300  ciprofloxacin (CIPRO) IVPB 400 mg     400 mg 200 mL/hr over 60 Minutes Intravenous  Once 10/16/12 2257 10/17/12 0055      Social History:  reports that she has never smoked. She does not have any smokeless tobacco history on file. She reports that she does not drink alcohol or use illicit drugs.  Family History  Problem  Relation Age of Onset  . Cancer Neg Hx   . Heart disease Neg Hx     As in HPI and primary teams notes otherwise 12 point review of systems is negative  Blood pressure 96/68, pulse 62, temperature 98.6 F (37 C), temperature source Oral, resp. rate 18, height 5' 6.93" (1.7 m), weight 134 lb 9.6 oz (61.054 kg), SpO2 98.00%. General: Alert and awake, oriented x3, not in any acute distress. HEENT: anicteric sclera, pupils reactive to light and accommodation, EOMI, oropharynx clear and without exudate CVS regular rate, normal r,  no murmur rubs or gallops Chest: clear to auscultation  bilaterally, no wheezing, rales or rhonchi Abdomen: soft nontender, nondistended, normal bowel sounds, Extremities large open ulcer on right ankle  Skin: no rashes Neuro: no focal deficits other than poor sensation in foot   Results for orders placed during the hospital encounter of 10/16/12 (from the past 48 hour(s))  CBC WITH DIFFERENTIAL     Status: Abnormal   Collection Time    10/16/12  8:45 PM      Result Value Range   WBC 9.2  4.0 - 10.5 K/uL   RBC 3.75 (*) 3.87 - 5.11 MIL/uL   Hemoglobin 11.3 (*) 12.0 - 15.0 g/dL   HCT 29.5 (*) 62.1 - 30.8 %   MCV 88.8  78.0 - 100.0 fL   MCH 30.1  26.0 - 34.0 pg   MCHC 33.9  30.0 - 36.0 g/dL   RDW 65.7  84.6 - 96.2 %   Platelets 203  150 - 400 K/uL   Neutrophils Relative % 71  43 - 77 %   Neutro Abs 6.5  1.7 - 7.7 K/uL   Lymphocytes Relative 21  12 - 46 %   Lymphs Abs 1.9  0.7 - 4.0 K/uL   Monocytes Relative 8  3 - 12 %   Monocytes Absolute 0.7  0.1 - 1.0 K/uL   Eosinophils Relative 1  0 - 5 %   Eosinophils Absolute 0.1  0.0 - 0.7 K/uL   Basophils Relative 0  0 - 1 %   Basophils Absolute 0.0  0.0 - 0.1 K/uL  COMPREHENSIVE METABOLIC PANEL     Status: Abnormal   Collection Time    10/16/12  8:45 PM      Result Value Range   Sodium 131 (*) 135 - 145 mEq/L   Potassium 3.9  3.5 - 5.1 mEq/L   Chloride 96  96 - 112 mEq/L   CO2 21  19 - 32 mEq/L   Glucose, Bld 207 (*) 70 - 99 mg/dL   BUN 47 (*) 6 - 23 mg/dL   Creatinine, Ser 9.52 (*) 0.50 - 1.10 mg/dL   Calcium 8.9  8.4 - 84.1 mg/dL   Total Protein 6.4  6.0 - 8.3 g/dL   Albumin 2.5 (*) 3.5 - 5.2 g/dL   AST 14  0 - 37 U/L   ALT 6  0 - 35 U/L   Alkaline Phosphatase 69  39 - 117 U/L   Total Bilirubin 0.8  0.3 - 1.2 mg/dL   GFR calc non Af Amer 14 (*) >90 mL/min   GFR calc Af Amer 16 (*) >90 mL/min   Comment:            The eGFR has been calculated     using the CKD EPI equation.     This calculation has not been     validated in all clinical  situations.     eGFR's  persistently     <90 mL/min signify     possible Chronic Kidney Disease.  APTT     Status: None   Collection Time    10/16/12  8:45 PM      Result Value Range   aPTT 31  24 - 37 seconds  PROTIME-INR     Status: None   Collection Time    10/16/12  8:45 PM      Result Value Range   Prothrombin Time 14.7  11.6 - 15.2 seconds   INR 1.17  0.00 - 1.49  SEDIMENTATION RATE     Status: Abnormal   Collection Time    10/16/12  8:45 PM      Result Value Range   Sed Rate 55 (*) 0 - 22 mm/hr  TROPONIN I     Status: None   Collection Time    10/16/12  8:45 PM      Result Value Range   Troponin I <0.30  <0.30 ng/mL   Comment:            Due to the release kinetics of cTnI,     a negative result within the first hours     of the onset of symptoms does not rule out     myocardial infarction with certainty.     If myocardial infarction is still suspected,     repeat the test at appropriate intervals.  D-DIMER, QUANTITATIVE     Status: Abnormal   Collection Time    10/16/12  8:45 PM      Result Value Range   D-Dimer, Quant 7.56 (*) 0.00 - 0.48 ug/mL-FEU   Comment:            AT THE INHOUSE ESTABLISHED CUTOFF     VALUE OF 0.48 ug/mL FEU,     THIS ASSAY HAS BEEN DOCUMENTED     IN THE LITERATURE TO HAVE     A SENSITIVITY AND NEGATIVE     PREDICTIVE VALUE OF AT LEAST     98 TO 99%.  THE TEST RESULT     SHOULD BE CORRELATED WITH     AN ASSESSMENT OF THE CLINICAL     PROBABILITY OF DVT / VTE.  PRO B NATRIURETIC PEPTIDE     Status: Abnormal   Collection Time    10/16/12  8:45 PM      Result Value Range   Pro B Natriuretic peptide (BNP) 1528.0 (*) 0 - 450 pg/mL  HEMOGLOBIN A1C     Status: Abnormal   Collection Time    10/17/12 12:43 AM      Result Value Range   Hemoglobin A1C 6.6 (*) <5.7 %   Comment: (NOTE)                                                                               According to the ADA Clinical Practice Recommendations for 2011, when     HbA1c is used as a  screening test:      >=6.5%   Diagnostic of Diabetes Mellitus               (if abnormal result  is confirmed)     5.7-6.4%   Increased risk of developing Diabetes Mellitus     References:Diagnosis and Classification of Diabetes Mellitus,Diabetes     Care,2011,34(Suppl 1):S62-S69 and Standards of Medical Care in             Diabetes - 2011,Diabetes Care,2011,34 (Suppl 1):S11-S61.   Mean Plasma Glucose 143 (*) <117 mg/dL   Comment: Performed at [facility]  TROPONIN I     Status: None   Collection Time    10/17/12 12:43 AM      Result Value Range   Troponin I <0.30  <0.30 ng/mL   Comment:            Due to the release kinetics of cTnI,     a negative result within the first hours     of the onset of symptoms does not rule out     myocardial infarction with certainty.     If myocardial infarction is still suspected,     repeat the test at appropriate intervals.  CULTURE, BLOOD (ROUTINE X 2)     Status: None   Collection Time    10/17/12 12:43 AM      Result Value Range   Specimen Description BLOOD RIGHT ANTECUBITAL     Special Requests BOTTLES DRAWN AEROBIC AND ANAEROBIC 5CC     Culture  Setup Time       Value: 10/17/2012 05:01     Performed at [facility]   Culture       Value:        BLOOD CULTURE RECEIVED NO GROWTH TO DATE CULTURE WILL BE HELD FOR 5 DAYS BEFORE ISSUING A FINAL NEGATIVE REPORT     Performed at [facility]   Report Status PENDING    GLUCOSE, CAPILLARY     Status: Abnormal   Collection Time    10/17/12 12:44 AM      Result Value Range   Glucose-Capillary 152 (*) 70 - 99 mg/dL   Comment 1 Notify RN    WOUND CULTURE     Status: None   Collection Time    10/17/12 12:51 AM      Result Value Range   Specimen Description FOOT     Special Requests NONE     Gram Stain PENDING     Culture       Value: Culture reincubated for better growth     Performed at [facility]   Report Status PENDING    CULTURE, BLOOD (ROUTINE X 2)      Status: None   Collection Time    10/17/12 12:55 AM      Result Value Range   Specimen Description BLOOD RIGHT HAND     Special Requests BOTTLES DRAWN AEROBIC AND ANAEROBIC 1CC     Culture  Setup Time       Value: 10/17/2012 05:01     Performed at [facility]   Culture       Value:        BLOOD CULTURE RECEIVED NO GROWTH TO DATE CULTURE WILL BE HELD FOR 5 DAYS BEFORE ISSUING A FINAL NEGATIVE REPORT     Performed at [facility]   Report Status PENDING    MRSA PCR SCREENING     Status: None   Collection Time    10/17/12  2:34 AM      Result Value Range   MRSA by PCR NEGATIVE  NEGATIVE   Comment:  The GeneXpert MRSA Assay (FDA     approved for NASAL specimens     only), is one component of a     comprehensive MRSA colonization     surveillance program. It is not     intended to diagnose MRSA     infection nor to guide or     monitor treatment for     MRSA infections.  TROPONIN I     Status: None   Collection Time    10/17/12  6:45 AM      Result Value Range   Troponin I <0.30  <0.30 ng/mL   Comment:            Due to the release kinetics of cTnI,     a negative result within the first hours     of the onset of symptoms does not rule out     myocardial infarction with certainty.     If myocardial infarction is still suspected,     repeat the test at appropriate intervals.  BASIC METABOLIC PANEL     Status: Abnormal   Collection Time    10/17/12  6:45 AM      Result Value Range   Sodium 131 (*) 135 - 145 mEq/L   Potassium 3.6  3.5 - 5.1 mEq/L   Chloride 98  96 - 112 mEq/L   CO2 22  19 - 32 mEq/L   Glucose, Bld 190 (*) 70 - 99 mg/dL   BUN 46 (*) 6 - 23 mg/dL   Creatinine, Ser 4.09 (*) 0.50 - 1.10 mg/dL   Calcium 8.2 (*) 8.4 - 10.5 mg/dL   GFR calc non Af Amer 15 (*) >90 mL/min   GFR calc Af Amer 17 (*) >90 mL/min   Comment:            The eGFR has been calculated     using the CKD EPI equation.     This calculation has not been      validated in all clinical     situations.     eGFR's persistently     <90 mL/min signify     possible Chronic Kidney Disease.  GLUCOSE, CAPILLARY     Status: Abnormal   Collection Time    10/17/12  8:08 AM      Result Value Range   Glucose-Capillary 184 (*) 70 - 99 mg/dL  TROPONIN I     Status: None   Collection Time    10/17/12 11:40 AM      Result Value Range   Troponin I <0.30  <0.30 ng/mL   Comment:            Due to the release kinetics of cTnI,     a negative result within the first hours     of the onset of symptoms does not rule out     myocardial infarction with certainty.     If myocardial infarction is still suspected,     repeat the test at appropriate intervals.  GLUCOSE, CAPILLARY     Status: Abnormal   Collection Time    10/17/12 11:56 AM      Result Value Range   Glucose-Capillary 159 (*) 70 - 99 mg/dL   Comment 1 Notify RN     Comment 2 Documented in Chart    GLUCOSE, CAPILLARY     Status: Abnormal   Collection Time    10/17/12  4:53 PM      Result Value Range  Glucose-Capillary 129 (*) 70 - 99 mg/dL  GLUCOSE, CAPILLARY     Status: Abnormal   Collection Time    10/17/12  9:44 PM      Result Value Range   Glucose-Capillary 127 (*) 70 - 99 mg/dL   Comment 1 Notify RN    GLUCOSE, CAPILLARY     Status: Abnormal   Collection Time    10/18/12  7:55 AM      Result Value Range   Glucose-Capillary 137 (*) 70 - 99 mg/dL   Comment 1 Notify RN    CBC     Status: Abnormal   Collection Time    10/18/12 10:48 AM      Result Value Range   WBC 7.7  4.0 - 10.5 K/uL   RBC 3.28 (*) 3.87 - 5.11 MIL/uL   Hemoglobin 9.8 (*) 12.0 - 15.0 g/dL   HCT 30.8 (*) 65.7 - 84.6 %   MCV 89.9  78.0 - 100.0 fL   MCH 29.9  26.0 - 34.0 pg   MCHC 33.2  30.0 - 36.0 g/dL   RDW 96.2  95.2 - 84.1 %   Platelets 218  150 - 400 K/uL  BASIC METABOLIC PANEL     Status: Abnormal   Collection Time    10/18/12 10:48 AM      Result Value Range   Sodium 128 (*) 135 - 145 mEq/L   Potassium  3.8  3.5 - 5.1 mEq/L   Chloride 96  96 - 112 mEq/L   CO2 20  19 - 32 mEq/L   Glucose, Bld 151 (*) 70 - 99 mg/dL   BUN 40 (*) 6 - 23 mg/dL   Creatinine, Ser 3.24 (*) 0.50 - 1.10 mg/dL   Calcium 8.2 (*) 8.4 - 10.5 mg/dL   GFR calc non Af Amer 21 (*) >90 mL/min   GFR calc Af Amer 25 (*) >90 mL/min   Comment:            The eGFR has been calculated     using the CKD EPI equation.     This calculation has not been     validated in all clinical     situations.     eGFR's persistently     <90 mL/min signify     possible Chronic Kidney Disease.  GLUCOSE, CAPILLARY     Status: Abnormal   Collection Time    10/18/12 11:46 AM      Result Value Range   Glucose-Capillary 156 (*) 70 - 99 mg/dL   Comment 1 Notify RN        Component Value Date/Time   SDES BLOOD RIGHT HAND 10/17/2012 0055   SPECREQUEST BOTTLES DRAWN AEROBIC AND ANAEROBIC 1CC 10/17/2012 0055   CULT  Value:        BLOOD CULTURE RECEIVED NO GROWTH TO DATE CULTURE WILL BE HELD FOR 5 DAYS BEFORE ISSUING A FINAL NEGATIVE REPORT Performed at [facility] 10/17/2012 0055   REPTSTATUS PENDING 10/17/2012 0055   Dg Chest 1 View  10/16/2012   *RADIOLOGY REPORT*  Clinical Data: Chest pain.  Weakness.  CHEST - 1 VIEW  Comparison: Chest x-ray 05/27/2004.  Findings: Lung volumes are normal.  No consolidative airspace disease.  No pleural effusions.  No pneumothorax.  No pulmonary nodule or mass noted.  Pulmonary vasculature and the cardiomediastinal silhouette are within normal limits. Atherosclerosis in the thoracic aorta.  Status post median sternotomy for CABG.  Post traumatic deformity of the left humeral  head and neck.  IMPRESSION: 1. No radiographic evidence of acute cardiopulmonary disease. 2.  Atherosclerosis.   Original Report Authenticated By: Trudie Reed, M.D.   Dg Ankle Complete Right  10/16/2012   *RADIOLOGY REPORT*  Clinical Data: Diabetic ulcer, lateral malleolus  RIGHT ANKLE - COMPLETE 3+ VIEW  Comparison: None.  Findings:  Bones are osteopenic.  Soft tissue defect over the lateral malleolus compatible with ulceration.  There is underlying thinning of the cortex and subcortical bone loss of the lateral malleolus suspicious for early osteomyelitis.  There is also cortical step off on the oblique view compatible with a right distal fibula fracture, nondisplaced.  Distal tibia, talus and calcaneus appear intact.  Grossly normal alignment.  Peripheral vessel calcifications evident.  Right fifth toe amputation also present.  IMPRESSION: Lateral malleolar soft tissue ulceration with underlying cortical thinning and subcortical bone loss of the lateral malleolus with an associated nondisplaced fracture.  Appearance is concerning for osteomyelitis.  Osteopenia  Peripheral atherosclerosis  Previous right fifth toe amputation   Original Report Authenticated By: Judie Petit. Shick, M.D.   Nm Pulmonary Perf And Vent  10/17/2012   *RADIOLOGY REPORT*  Clinical Data: Elevated D-dimer.  Pulmonary embolism.  NM PULMONARY VENTILATION AND PERFUSION SCAN  Radiopharmaceutical: CURIE MAA TECHNETIUM TO 53M ALBUMIN AGGREGATED 43.9 mCi technetium 99 DTPA .  Comparison: Chest radiograph 10/16/2012.  Findings: The patient was unable to raise arms over head.  This degrades the lateral views on this study.  There are no segmental perfusion defects.  Mildly heterogeneous perfusion is present in the right lung.  The similar changes are present in the left upper lobe on the anterior view.  Radiotracer is present in the trachea on the ventilation study.  No corresponding ventilation defects are identified.  IMPRESSION: Very low probability of pulmonary embolism (0% to 9%).   Original Report Authenticated By: Andreas Newport, M.D.     Recent Results (from the past 720 hour(s))  CULTURE, BLOOD (ROUTINE X 2)     Status: None   Collection Time    10/17/12 12:43 AM      Result Value Range Status   Specimen Description BLOOD RIGHT ANTECUBITAL   Final   Special  Requests BOTTLES DRAWN AEROBIC AND ANAEROBIC 5CC   Final   Culture  Setup Time     Final   Value: 10/17/2012 05:01     Performed at [facility]   Culture     Final   Value:        BLOOD CULTURE RECEIVED NO GROWTH TO DATE CULTURE WILL BE HELD FOR 5 DAYS BEFORE ISSUING A FINAL NEGATIVE REPORT     Performed at [facility]   Report Status PENDING   Incomplete  WOUND CULTURE     Status: None   Collection Time    10/17/12 12:51 AM      Result Value Range Status   Specimen Description FOOT   Final   Special Requests NONE   Final   Gram Stain PENDING   Incomplete   Culture     Final   Value: Culture reincubated for better growth     Performed at [facility]   Report Status PENDING   Incomplete  CULTURE, BLOOD (ROUTINE X 2)     Status: None   Collection Time    10/17/12 12:55 AM      Result Value Range Status   Specimen Description BLOOD RIGHT HAND   Final   Special Requests BOTTLES  DRAWN AEROBIC AND ANAEROBIC 1CC   Final   Culture  Setup Time     Final   Value: 10/17/2012 05:01     Performed at [facility]   Culture     Final   Value:        BLOOD CULTURE RECEIVED NO GROWTH TO DATE CULTURE WILL BE HELD FOR 5 DAYS BEFORE ISSUING A FINAL NEGATIVE REPORT     Performed at [facility]   Report Status PENDING   Incomplete  MRSA PCR SCREENING     Status: None   Collection Time    10/17/12  2:34 AM      Result Value Range Status   MRSA by PCR NEGATIVE  NEGATIVE Final   Comment:            The GeneXpert MRSA Assay (FDA     approved for NASAL specimens     only), is one component of a     comprehensive MRSA colonization     surveillance program. It is not     intended to diagnose MRSA     infection nor to guide or     monitor treatment for     MRSA infections.     Impression/Recommendation  79 yo with DM, CKD, prior BKA on left side, now with osteomyelitis and fracture of right malleolus   #1 Osteomyelitis of the right  malleolus:  This is NOT CURABLE with IV and protracted oral antibiotics and I have made this clear to the patient and the daughter  We can certainly ATTEMPT NON-TARGETTED IV abx followed by non-targetted oral suppressive abx but there will remain high risk of loosing control of this infection, including bacteremia etc.   At this time Orthopedics is NOT recommending surgical intervention and I imagine this is due to concerns about risk of morbidity of the procedure which would leave her without a leg on either side. That being said she is minimally mobile at present per the daughter  For now continue with broad spectrum abx (vanco and now cefepime)  Would recommend Orthopedics have discussions with family re reasons for not operating, risks benefits because I do not want to be trying to extrapolate and speculate when talking to the family  I spent greater than 60 minutes with the patient including greater than 50% of time in face to face counsel of the patient and in coordination of their care.   Thank you so much for this interesting consult  [facility] [phone] (pager) [phone] (office) 10/18/2012, 2:44 PM  Paulette Blanch Dam 10/18/2012, 2:44 PM

## 2012-10-18 NOTE — Progress Notes (Signed)
ANTIBIOTIC CONSULT NOTE - INITIAL  Pharmacy Consult for Cefepime, Vancomycin Indication: Osteomyelitis  No Known Allergies  Patient Measurements: Height: 5' 6.93" (170 cm) Weight: 134 lb 9.6 oz (61.054 kg) IBW/kg (Calculated) : 61.44 Adjusted Body Weight:   Vital Signs: Temp: 98.6 F (37 C) (08/09 0600) Temp src: Oral (08/09 0600) BP: 96/68 mmHg (08/09 0600) Pulse Rate: 62 (08/09 0600) Intake/Output from previous day: 08/08 0701 - 08/09 0700 In: 440 [P.O.:120; I.V.:320] Out: 2 [Stool:2] Intake/Output from this shift: Total I/O In: 1552.1 [P.O.:240; I.V.:1112.1; IV Piggyback:200] Out: 100 [Urine:100]  Labs:  Recent Labs  10/16/12 2045 10/17/12 0645 10/18/12 1048  WBC 9.2  --  7.7  HGB 11.3*  --  9.8*  PLT 203  --  218  CREATININE 2.96* 2.75* 2.02*   Estimated Creatinine Clearance: 20 ml/min (by C-G formula based on Cr of 2.02). No results found for this basename: VANCOTROUGH, Leodis Binet, VANCORANDOM, GENTTROUGH, GENTPEAK, GENTRANDOM, TOBRATROUGH, TOBRAPEAK, TOBRARND, AMIKACINPEAK, AMIKACINTROU, AMIKACIN,  in the last 72 hours   Microbiology: Recent Results (from the past 720 hour(s))  MRSA PCR SCREENING     Status: None   Collection Time    10/17/12  2:34 AM      Result Value Range Status   MRSA by PCR NEGATIVE  NEGATIVE Final   Comment:            The GeneXpert MRSA Assay (FDA     approved for NASAL specimens     only), is one component of a     comprehensive MRSA colonization     surveillance program. It is not     intended to diagnose MRSA     infection nor to guide or     monitor treatment for     MRSA infections.    Medical History: Past Medical History  Diagnosis Date  . ALLERGIC RHINITIS 12/30/2006  . ANEMIA, IRON DEFICIENCY 10/21/2009  . COLONIC POLYPS, HX OF 12/30/2006  . CORONARY ARTERY DISEASE 12/30/2006  . DIABETES MELLITUS, TYPE I 12/30/2006  . DIVERTICULOSIS, COLON 12/30/2006  . HYPERLIPIDEMIA 12/30/2006  . HYPERTENSION 12/30/2006  .  PERIPHERAL NEUROPATHY 12/30/2006  . PERIPHERAL VASCULAR DISEASE 12/30/2006  . GOITER 12/30/2006  . VITAMIN B12 DEFICIENCY 09/16/2008  . Carpal tunnel syndrome 12/30/2006  . CATARACTS, BILATERAL 12/30/2006  . HEMORRHOIDS 12/30/2006  . DEGENERATIVE JOINT DISEASE 02/10/2007  . INSOMNIA 10/21/2009  . CORNEAL TRANSPLANT 12/30/2006  . BKA, LEFT LEG 12/30/2006   Assessment: 79 y.o. year old female with significant past medical history of diabetes, CKD, peripheral vascular disease status post left BKA, on IV antibiotics for right lateral malleolar osteomylelitis.  Today, cipro d/c'd and cefepime consult per pharmacy ordered.  Renal function improving.  WBCs decreasing.   D#2 Antibiotics 8/8 >> vanc >> 8/8 >> cipro >> 8/8 8/9 >> Cefepime >>   Tmax: AF WBCs: WNL Renal: SCr 2.02, CrCl ~ 20 ml/min, N = 23 CKD: baseline SCr 1.2-1.7  8/8 blood x2: collected 8/8 foot wound: collected  Dose changes/drug level info:   Goal of Therapy:  Vancomycin trough level 15-20 mcg/ml  Plan:  1.  Change to vancomycin 750mg  IV q 24 hours due to improved renal function  2.  Cefepime 1 gram IV q 24 hours.  3.  F/u renal fxn, clinical course.   Haynes Hoehn, PharmD 10/18/2012 11:57 AM  Pager: [phone]

## 2012-10-18 NOTE — Progress Notes (Signed)
RN spoke with patient's daughter-n-law and son regarding patient's status per the patient's request.

## 2012-10-18 NOTE — Progress Notes (Signed)
[facility] PROGRESS NOTE  Angelica Lawrence EXB:[id number] DOB: [birth year] DOA: 10/16/2012 PCP: [name], MD  Assessment/Plan: 1-R lateral malleolar osteomyelitis/fracture: CAM walker at bedside per ortho. Continue with  vanc  day 3. Ortho recommend medical management. Cipro change to cefepime. Appreciate Dr Daiva Eves help.   2-Right: DVT noted in the common femoral vein and saphenofemoral junction. Partial DVT in the popliteal vein. There appears to be superficial thrombus in the proximal greater saphenous vein. Doppler positive for DVT right LE. Continue with Lovenox.  VQ scan low probability. Continue with coumadin. Day 3 Lovenox.   3-Anemia: likely anemia of chronic disease. Continue to follow.  4-Acute on CKD: Baseline Cr 1.2-1.7. Cr on admission at 2.9. Continue with IV fluids. Avoid hypotension. Cr decrease to 2.0 5-Hypotension : in setting of infection. Respond to IV fluids. Monitor. Hold nitrates.  6-Hyponatremia: Continue with NS IV fluids.   Code Status: DNR Family Communication: none at bedside.  Disposition Plan: to be determine   Consultants:  Dr Eulah Pont  Procedures:  Doppler Right Lower extremity positive for DVT  Antibiotics:  Vancomycin 8-7  Ciprofloxacin 8-7  HPI/Subjective: Feeling well, tolerating diet. No pain on right LE.   Objective: Filed Vitals:   10/18/12 0600  BP: 96/68  Pulse: 62  Temp: 98.6 F (37 C)  Resp: 18    Intake/Output Summary (Last 24 hours) at 10/18/12 1219 Last data filed at 10/18/12 0837  Gross per 24 hour  Intake 1872.08 ml  Output    102 ml  Net 1770.08 ml   Filed Weights   10/16/12 2325  Weight: 61.054 kg (134 lb 9.6 oz)    Exam:   General:  No distress.   Cardiovascular: S 1, S 2 RRR  Respiratory: CTA  Abdomen: Bs present, soft, NT  Musculoskeletal: Right LE with malleolar ulcer, redness.   Data Reviewed: Basic Metabolic Panel:  Recent Labs Lab 10/16/12 2045 10/17/12 0645 10/18/12 1048  NA  131* 131* 128*  K 3.9 3.6 3.8  CL 96 98 96  CO2 21 22 20   GLUCOSE 207* 190* 151*  BUN 47* 46* 40*  CREATININE 2.96* 2.75* 2.02*  CALCIUM 8.9 8.2* 8.2*   Liver Function Tests:  Recent Labs Lab 10/16/12 2045  AST 14  ALT 6  ALKPHOS 69  BILITOT 0.8  PROT 6.4  ALBUMIN 2.5*   No results found for this basename: LIPASE, AMYLASE,  in the last 168 hours No results found for this basename: AMMONIA,  in the last 168 hours CBC:  Recent Labs Lab 10/16/12 2045 10/18/12 1048  WBC 9.2 7.7  NEUTROABS 6.5  --   HGB 11.3* 9.8*  HCT 33.3* 29.5*  MCV 88.8 89.9  PLT 203 218   Cardiac Enzymes:  Recent Labs Lab 10/16/12 2045 10/17/12 0043 10/17/12 0645 10/17/12 1140  TROPONINI <0.30 <0.30 <0.30 <0.30   BNP (last 3 results)  Recent Labs  10/16/12 2045  PROBNP 1528.0*   CBG:  Recent Labs Lab 10/17/12 1156 10/17/12 1653 10/17/12 2144 10/18/12 0755 10/18/12 1146  GLUCAP 159* 129* 127* 137* 156*    Recent Results (from the past 240 hour(s))  MRSA PCR SCREENING     Status: None   Collection Time    10/17/12  2:34 AM      Result Value Range Status   MRSA by PCR NEGATIVE  NEGATIVE Final   Comment:            The GeneXpert MRSA Assay (FDA  approved for NASAL specimens     only), is one component of a     comprehensive MRSA colonization     surveillance program. It is not     intended to diagnose MRSA     infection nor to guide or     monitor treatment for     MRSA infections.     Studies: Dg Chest 1 View  10/16/2012   *RADIOLOGY REPORT*  Clinical Data: Chest pain.  Weakness.  CHEST - 1 VIEW  Comparison: Chest x-ray 05/27/2004.  Findings: Lung volumes are normal.  No consolidative airspace disease.  No pleural effusions.  No pneumothorax.  No pulmonary nodule or mass noted.  Pulmonary vasculature and the cardiomediastinal silhouette are within normal limits. Atherosclerosis in the thoracic aorta.  Status post median sternotomy for CABG.  Post traumatic deformity  of the left humeral head and neck.  IMPRESSION: 1. No radiographic evidence of acute cardiopulmonary disease. 2.  Atherosclerosis.   Original Report Authenticated By: Trudie Reed, M.D.   Dg Ankle Complete Right  10/16/2012   *RADIOLOGY REPORT*  Clinical Data: Diabetic ulcer, lateral malleolus  RIGHT ANKLE - COMPLETE 3+ VIEW  Comparison: None.  Findings: Bones are osteopenic.  Soft tissue defect over the lateral malleolus compatible with ulceration.  There is underlying thinning of the cortex and subcortical bone loss of the lateral malleolus suspicious for early osteomyelitis.  There is also cortical step off on the oblique view compatible with a right distal fibula fracture, nondisplaced.  Distal tibia, talus and calcaneus appear intact.  Grossly normal alignment.  Peripheral vessel calcifications evident.  Right fifth toe amputation also present.  IMPRESSION: Lateral malleolar soft tissue ulceration with underlying cortical thinning and subcortical bone loss of the lateral malleolus with an associated nondisplaced fracture.  Appearance is concerning for osteomyelitis.  Osteopenia  Peripheral atherosclerosis  Previous right fifth toe amputation   Original Report Authenticated By: Judie Petit. Shick, M.D.   Nm Pulmonary Perf And Vent  10/17/2012   *RADIOLOGY REPORT*  Clinical Data: Elevated D-dimer.  Pulmonary embolism.  NM PULMONARY VENTILATION AND PERFUSION SCAN  Radiopharmaceutical: CURIE MAA TECHNETIUM TO 8M ALBUMIN AGGREGATED 43.9 mCi technetium 99 DTPA .  Comparison: Chest radiograph 10/16/2012.  Findings: The patient was unable to raise arms over head.  This degrades the lateral views on this study.  There are no segmental perfusion defects.  Mildly heterogeneous perfusion is present in the right lung.  The similar changes are present in the left upper lobe on the anterior view.  Radiotracer is present in the trachea on the ventilation study.  No corresponding ventilation defects are identified.   IMPRESSION: Very low probability of pulmonary embolism (0% to 9%).   Original Report Authenticated By: Andreas Newport, M.D.    Scheduled Meds: . ceFEPime (MAXIPIME) IV  1 g Intravenous Q24H  . [MAR HOLD] enoxaparin (LOVENOX) injection  60 mg Subcutaneous Q24H  . [facility] HOLD] feeding supplement  237 mL Oral BID BM  . [facility] HOLD] ferrous sulfate  325 mg Oral Daily  . [MAR HOLD] insulin aspart  0-5 Units Subcutaneous QHS  . [MAR HOLD] insulin aspart  0-9 Units Subcutaneous TID WC  . [facility] HOLD] Linaclotide  145 mcg Oral Daily  . [MAR HOLD] pantoprazole  40 mg Oral Daily  . [facility] HOLD] simvastatin  40 mg Oral QHS  . [MAR HOLD] sodium chloride  500 mL Intravenous Once  . [MAR HOLD] sodium chloride  3 mL Intravenous Q12H  . [facility] HOLD]  traZODone  100 mg Oral QHS  . vancomycin  750 mg Intravenous Q24H  . warfarin  5 mg Oral ONCE-1800  . [facility] HOLD] warfarin   Does not apply Once  . Warfarin - Pharmacist Dosing Inpatient   Does not apply q1800   Continuous Infusions: . sodium chloride 75 mL/hr at 10/17/12 2355    Principal Problem:   Hyponatremia Active Problems:   Anemia   Closed fibular fracture   Decubitus ulcer, ankle   Hyperglycemia   Malnutrition   Protein-calorie malnutrition, severe   Acute on chronic renal failure    Time spent: 35 minutes.     REGALADO,BELKYS  [facility] Pager [phone]. If 7PM-7AM, please contact night-coverage at [email or web address], password [facility] 10/18/2012, 12:19 PM  LOS: 2 days

## 2012-10-18 NOTE — Progress Notes (Signed)
ANTICOAGULATION CONSULT NOTE - Initial Consult  Pharmacy Consult for:  Coumadin Indication:  Treatment of DVT  No Known Allergies  Patient Measurements: Height: 5' 6.93" (170 cm) Weight: 134 lb 9.6 oz (61.054 kg) IBW/kg (Calculated) : 61.44   Vital Signs: Temp: 98.6 F (37 C) (08/09 0600) Temp src: Oral (08/09 0600) BP: 96/68 mmHg (08/09 0600) Pulse Rate: 62 (08/09 0600)  Labs:  Recent Labs  10/16/12 2045 10/17/12 0043 10/17/12 0645 10/17/12 1140 10/18/12 1048  HGB 11.3*  --   --   --  9.8*  HCT 33.3*  --   --   --  29.5*  PLT 203  --   --   --  218  APTT 31  --   --   --   --   LABPROT 14.7  --   --   --   --   INR 1.17  --   --   --   --   CREATININE 2.96*  --  2.75*  --  2.02*  TROPONINI <0.30 <0.30 <0.30 <0.30  --     Estimated Creatinine Clearance: 20 ml/min (by C-G formula based on Cr of 2.02).   Medical History: Past Medical History  Diagnosis Date  . ALLERGIC RHINITIS 12/30/2006  . ANEMIA, IRON DEFICIENCY 10/21/2009  . COLONIC POLYPS, HX OF 12/30/2006  . CORONARY ARTERY DISEASE 12/30/2006  . DIABETES MELLITUS, TYPE I 12/30/2006  . DIVERTICULOSIS, COLON 12/30/2006  . HYPERLIPIDEMIA 12/30/2006  . HYPERTENSION 12/30/2006  . PERIPHERAL NEUROPATHY 12/30/2006  . PERIPHERAL VASCULAR DISEASE 12/30/2006  . GOITER 12/30/2006  . VITAMIN B12 DEFICIENCY 09/16/2008  . Carpal tunnel syndrome 12/30/2006  . CATARACTS, BILATERAL 12/30/2006  . HEMORRHOIDS 12/30/2006  . DEGENERATIVE JOINT DISEASE 02/10/2007  . INSOMNIA 10/21/2009  . CORNEAL TRANSPLANT 12/30/2006  . BKA, LEFT LEG 12/30/2006    Medications:  Scheduled:  . ceFEPime (MAXIPIME) IV  1 g Intravenous Q24H  . [MAR HOLD] enoxaparin (LOVENOX) injection  60 mg Subcutaneous Q24H  . [facility] HOLD] feeding supplement  237 mL Oral BID BM  . [facility] HOLD] ferrous sulfate  325 mg Oral Daily  . [MAR HOLD] insulin aspart  0-5 Units Subcutaneous QHS  . [MAR HOLD] insulin aspart  0-9 Units Subcutaneous TID WC  . [facility]  HOLD] Linaclotide  145 mcg Oral Daily  . [MAR HOLD] pantoprazole  40 mg Oral Daily  . [facility] HOLD] simvastatin  40 mg Oral QHS  . [MAR HOLD] sodium chloride  500 mL Intravenous Once  . [MAR HOLD] sodium chloride  3 mL Intravenous Q12H  . [facility] HOLD] traZODone  100 mg Oral QHS  . vancomycin  750 mg Intravenous Q24H  . [facility] HOLD] warfarin   Does not apply Once    Assessment:  Asked to assist with Coumadin therapy for this 79 year-old AA female with DVT of right LE (confirmed on doppler), currently receiving Lovenox.  Today's VQ scan result is very low probability of pulmonary embolism.  Note that VTE diagnosis requires a minimum of 5-day overlap of Coumadin with Lovenox.  The INR drawn 10/16/12 was reported as 1.17.  Goal of Therapy:  INR 2-3   Plan:   Warfarin 5 mg dose today.  Follow PT/INR daily.  Coumadin education for patient and/or family to follow.  Clance Boll, PharmD, BCPS Pager: [phone] 10/18/2012 12:09 PM

## 2012-10-19 DIAGNOSIS — S82409A Unspecified fracture of shaft of unspecified fibula, initial encounter for closed fracture: Secondary | ICD-10-CM

## 2012-10-19 LAB — GLUCOSE, CAPILLARY
Glucose-Capillary: 105 mg/dL — ABNORMAL HIGH (ref 70–99)
Glucose-Capillary: 161 mg/dL — ABNORMAL HIGH (ref 70–99)
Glucose-Capillary: 163 mg/dL — ABNORMAL HIGH (ref 70–99)

## 2012-10-19 LAB — BASIC METABOLIC PANEL
BUN: 34 mg/dL — ABNORMAL HIGH (ref 6–23)
Calcium: 8.3 mg/dL — ABNORMAL LOW (ref 8.4–10.5)
GFR calc Af Amer: 35 mL/min — ABNORMAL LOW (ref >90)
GFR calc non Af Amer: 30 mL/min — ABNORMAL LOW (ref >90)
Potassium: 4.1 mEq/L (ref 3.5–5.1)
Sodium: 133 mEq/L — ABNORMAL LOW (ref 135–145)

## 2012-10-19 LAB — PROTIME-INR: INR: 1.25 (ref 0.00–1.49)

## 2012-10-19 MED ORDER — WARFARIN SODIUM 5 MG PO TABS
5.0000 mg | ORAL_TABLET | Freq: Once | ORAL | Status: AC
  Administered 2012-10-19: 5 mg via ORAL
  Filled 2012-10-19: qty 1

## 2012-10-19 NOTE — Progress Notes (Signed)
[facility] PROGRESS NOTE  Angelica Lawrence EAV:[id number] DOB: April 20, 1926 DOA: 10/16/2012 PCP: [name], MD  Assessment/Plan: 1-R lateral malleolar osteomyelitis/fracture: CAM walker at bedside per ortho. Continue with  vanc  day 4. Ortho recommend medical management. Cipro change to cefepime. Appreciate Dr Daiva Eves help. She will need PICC. Will ask ortho to speak with the family.   2-Right: DVT noted in the common femoral vein and saphenofemoral junction. Partial DVT in the popliteal vein. There appears to be superficial thrombus in the proximal greater saphenous vein. Doppler positive for DVT right LE. Continue with Lovenox.  VQ scan low probability. Continue with coumadin. Day 4 Lovenox.   3-Anemia: likely anemia of chronic disease. Continue to follow.  4-Acute on CKD: Baseline Cr 1.2-1.7. Cr on admission at 2.9. Continue with IV fluids. Avoid hypotension. Cr decrease to 1.5 5-Hypotension : in setting of infection. Respond to IV fluids. Monitor. Hold nitrates.  6-Hyponatremia: Continue with NS IV fluids.   Code Status: DNR Family Communication: none at bedside.  Disposition Plan: to be determine   Consultants:  Dr Eulah Pont  Procedures:  Doppler Right Lower extremity positive for DVT  Antibiotics:  Vancomycin 8-7  Ciprofloxacin 8-7  HPI/Subjective: Feeling well, tolerating diet. No pain on right LE.   Objective: Filed Vitals:   10/19/12 0620  BP: 125/79  Pulse: 95  Temp: 99.1 F (37.3 C)  Resp: 16    Intake/Output Summary (Last 24 hours) at 10/19/12 1317 Last data filed at 10/19/12 0800  Gross per 24 hour  Intake 1947.09 ml  Output    200 ml  Net 1747.09 ml   Filed Weights   10/16/12 2325  Weight: 61.054 kg (134 lb 9.6 oz)    Exam:   General:  No distress.   Cardiovascular: S 1, S 2 RRR  Respiratory: CTA  Abdomen: Bs present, soft, NT  Musculoskeletal: Right LE with malleolar ulcer, redness.   Data Reviewed: Basic Metabolic  Panel:  Recent Labs Lab 10/16/12 2045 10/17/12 0645 10/18/12 1048 10/19/12 0944  NA 131* 131* 128* 133*  K 3.9 3.6 3.8 4.1  CL 96 98 96 103  CO2 21 22 20  18*  GLUCOSE 207* 190* 151* 146*  BUN 47* 46* 40* 34*  CREATININE 2.96* 2.75* 2.02* 1.52*  CALCIUM 8.9 8.2* 8.2* 8.3*   Liver Function Tests:  Recent Labs Lab 10/16/12 2045  AST 14  ALT 6  ALKPHOS 69  BILITOT 0.8  PROT 6.4  ALBUMIN 2.5*   No results found for this basename: LIPASE, AMYLASE,  in the last 168 hours No results found for this basename: AMMONIA,  in the last 168 hours CBC:  Recent Labs Lab 10/16/12 2045 10/18/12 1048  WBC 9.2 7.7  NEUTROABS 6.5  --   HGB 11.3* 9.8*  HCT 33.3* 29.5*  MCV 88.8 89.9  PLT 203 218   Cardiac Enzymes:  Recent Labs Lab 10/16/12 2045 10/17/12 0043 10/17/12 0645 10/17/12 1140  TROPONINI <0.30 <0.30 <0.30 <0.30   BNP (last 3 results)  Recent Labs  10/16/12 2045  PROBNP 1528.0*   CBG:  Recent Labs Lab 10/18/12 1146 10/18/12 1609 10/18/12 2221 10/19/12 0741 10/19/12 1124  GLUCAP 156* 158* 137* 105* 161*    Recent Results (from the past 240 hour(s))  CULTURE, BLOOD (ROUTINE X 2)     Status: None   Collection Time    10/17/12 12:43 AM      Result Value Range Status   Specimen Description BLOOD RIGHT ANTECUBITAL  Final   Special Requests BOTTLES DRAWN AEROBIC AND ANAEROBIC 5CC   Final   Culture  Setup Time     Final   Value: 10/17/2012 05:01     Performed at [facility]   Culture     Final   Value:        BLOOD CULTURE RECEIVED NO GROWTH TO DATE CULTURE WILL BE HELD FOR 5 DAYS BEFORE ISSUING A FINAL NEGATIVE REPORT     Performed at [facility]   Report Status PENDING   Incomplete  WOUND CULTURE     Status: None   Collection Time    10/17/12 12:51 AM      Result Value Range Status   Specimen Description FOOT   Final   Special Requests NONE   Final   Gram Stain PENDING   Incomplete   Culture     Final   Value: FEW  STAPHYLOCOCCUS AUREUS     Note: RIFAMPIN AND GENTAMICIN SHOULD NOT BE USED AS SINGLE DRUGS FOR TREATMENT OF STAPH INFECTIONS.     Performed at [facility]   Report Status PENDING   Incomplete  CULTURE, BLOOD (ROUTINE X 2)     Status: None   Collection Time    10/17/12 12:55 AM      Result Value Range Status   Specimen Description BLOOD RIGHT HAND   Final   Special Requests BOTTLES DRAWN AEROBIC AND ANAEROBIC 1CC   Final   Culture  Setup Time     Final   Value: 10/17/2012 05:01     Performed at [facility]   Culture     Final   Value:        BLOOD CULTURE RECEIVED NO GROWTH TO DATE CULTURE WILL BE HELD FOR 5 DAYS BEFORE ISSUING A FINAL NEGATIVE REPORT     Performed at [facility]   Report Status PENDING   Incomplete  MRSA PCR SCREENING     Status: None   Collection Time    10/17/12  2:34 AM      Result Value Range Status   MRSA by PCR NEGATIVE  NEGATIVE Final   Comment:            The GeneXpert MRSA Assay (FDA     approved for NASAL specimens     only), is one component of a     comprehensive MRSA colonization     surveillance program. It is not     intended to diagnose MRSA     infection nor to guide or     monitor treatment for     MRSA infections.     Studies: Nm Pulmonary Perf And Vent  10/17/2012   *RADIOLOGY REPORT*  Clinical Data: Elevated D-dimer.  Pulmonary embolism.  NM PULMONARY VENTILATION AND PERFUSION SCAN  Radiopharmaceutical: CURIE MAA TECHNETIUM TO 84M ALBUMIN AGGREGATED 43.9 mCi technetium 99 DTPA .  Comparison: Chest radiograph 10/16/2012.  Findings: The patient was unable to raise arms over head.  This degrades the lateral views on this study.  There are no segmental perfusion defects.  Mildly heterogeneous perfusion is present in the right lung.  The similar changes are present in the left upper lobe on the anterior view.  Radiotracer is present in the trachea on the ventilation study.  No corresponding ventilation defects are  identified.  IMPRESSION: Very low probability of pulmonary embolism (0% to 9%).   Original Report Authenticated By: Andreas Newport, M.D.  Scheduled Meds: . ceFEPime (MAXIPIME) IV  1 g Intravenous Q24H  . [MAR HOLD] enoxaparin (LOVENOX) injection  60 mg Subcutaneous Q24H  . [facility] HOLD] feeding supplement  237 mL Oral BID BM  . [facility] HOLD] ferrous sulfate  325 mg Oral Daily  . [MAR HOLD] insulin aspart  0-5 Units Subcutaneous QHS  . [MAR HOLD] insulin aspart  0-9 Units Subcutaneous TID WC  . [facility] HOLD] Linaclotide  145 mcg Oral Daily  . [MAR HOLD] pantoprazole  40 mg Oral Daily  . [facility] HOLD] simvastatin  40 mg Oral QHS  . [MAR HOLD] sodium chloride  500 mL Intravenous Once  . [MAR HOLD] sodium chloride  3 mL Intravenous Q12H  . [facility] HOLD] traZODone  100 mg Oral QHS  . vancomycin  750 mg Intravenous Q24H  . [facility] HOLD] warfarin   Does not apply Once  . Warfarin - Pharmacist Dosing Inpatient   Does not apply q1800   Continuous Infusions: . sodium chloride 75 mL/hr at 10/19/12 0532    Principal Problem:   Hyponatremia Active Problems:   Anemia   Closed fibular fracture   Decubitus ulcer, ankle   Hyperglycemia   Malnutrition   Protein-calorie malnutrition, severe   Acute on chronic renal failure    Time spent: 35 minutes.     Detravion Tester  [facility] Pager [phone]. If 7PM-7AM, please contact night-coverage at [email or web address], password [facility] 10/19/2012, 1:17 PM  LOS: 3 days

## 2012-10-19 NOTE — Progress Notes (Signed)
[facility]  Day # 4 vancomycin Day # 2 cefepime  Subjective: No new complaints   Antibiotics:  Anti-infectives   Start     Dose/Rate Route Frequency Ordered Stop   10/18/12 1400  [MAR Hold]  vancomycin (VANCOCIN) IVPB 1000 mg/200 mL premix  Status:  Discontinued     (On MAR Hold since 10/18/12 0733)   1,000 mg 200 mL/hr over 60 Minutes Intravenous Every 48 hours 10/17/12 0548 10/18/12 1200   10/18/12 1300  ceFEPIme (MAXIPIME) 1 g in dextrose 5 % 50 mL IVPB     1 g 100 mL/hr over 30 Minutes Intravenous Every 24 hours 10/18/12 1158     10/18/12 1230  vancomycin (VANCOCIN) IVPB 750 mg/150 ml premix     750 mg 150 mL/hr over 60 Minutes Intravenous Every 24 hours 10/18/12 1201     10/17/12 2200  [MAR Hold]  ciprofloxacin (CIPRO) IVPB 400 mg  Status:  Discontinued     (On MAR Hold since 10/18/12 0733)   400 mg 200 mL/hr over 60 Minutes Intravenous Daily at bedtime 10/17/12 0548 10/18/12 1059   10/16/12 2300  vancomycin (VANCOCIN) IVPB 1000 mg/200 mL premix     1,000 mg 200 mL/hr over 60 Minutes Intravenous  Once 10/16/12 2257 10/17/12 0323   10/16/12 2300  ciprofloxacin (CIPRO) IVPB 400 mg     400 mg 200 mL/hr over 60 Minutes Intravenous  Once 10/16/12 2257 10/17/12 0055      Medications: Scheduled Meds: . ceFEPime (MAXIPIME) IV  1 g Intravenous Q24H  . [MAR HOLD] enoxaparin (LOVENOX) injection  60 mg Subcutaneous Q24H  . [facility] HOLD] feeding supplement  237 mL Oral BID BM  . [facility] HOLD] ferrous sulfate  325 mg Oral Daily  . [MAR HOLD] insulin aspart  0-5 Units Subcutaneous QHS  . [MAR HOLD] insulin aspart  0-9 Units Subcutaneous TID WC  . [facility] HOLD] Linaclotide  145 mcg Oral Daily  . [MAR HOLD] pantoprazole  40 mg Oral Daily  . [facility] HOLD] simvastatin  40 mg Oral QHS  . [MAR HOLD] sodium chloride  500 mL Intravenous Once  . [MAR HOLD] sodium chloride  3 mL Intravenous Q12H  . [facility] HOLD] traZODone  100 mg Oral QHS  . vancomycin  750 mg Intravenous  Q24H  . warfarin  5 mg Oral ONCE-1800  . [facility] HOLD] warfarin   Does not apply Once  . Warfarin - Pharmacist Dosing Inpatient   Does not apply q1800   Continuous Infusions: . sodium chloride 75 mL/hr at 10/19/12 0532   PRN Meds:.[facility] HOLD] HYDROcodone-acetaminophen   Objective: Weight change:   Intake/Output Summary (Last 24 hours) at 10/19/12 1522 Last data filed at 10/19/12 0800  Gross per 24 hour  Intake 1257.09 ml  Output    200 ml  Net 1057.09 ml   Blood pressure 95/60, pulse 84, temperature 98.8 F (37.1 C), temperature source Oral, resp. rate 18, height 5' 6.93" (1.7 m), weight 134 lb 9.6 oz (61.054 kg), SpO2 100.00%. Temp:  [98.7 F (37.1 C)-99.1 F (37.3 C)] 98.8 F (37.1 C) (08/10 1400) Pulse Rate:  [84-95] 84 (08/10 1400) Resp:  [16-18] 18 (08/10 1400) BP: (95-125)/(60-79) 95/60 mmHg (08/10 1400) SpO2:  [100 %] 100 % (08/10 1400)  Physical Exam: General: Alert and awake, oriented x3, not in any acute distress.  HEENT: anicteric sclera, pupils reactive to light and accommodation, EOMI, oropharynx clear and without exudate  CVS regular rate, normal r, no  murmur rubs or gallops  Chest: clear to auscultation bilaterally, no wheezing, rales or rhonchi  Abdomen: soft nontender, nondistended, normal bowel sounds,  Extremities large open ulcer on right ankle , left BKA site is clean and well healed Skin: no rashes  Neuro: no focal deficits other than poor sensation in foot   Lab Results:  Recent Labs  10/16/12 2045 10/18/12 1048  WBC 9.2 7.7  HGB 11.3* 9.8*  HCT 33.3* 29.5*  PLT 203 218    BMET  Recent Labs  10/18/12 1048 10/19/12 0944  NA 128* 133*  K 3.8 4.1  CL 96 103  CO2 20 18*  GLUCOSE 151* 146*  BUN 40* 34*  CREATININE 2.02* 1.52*  CALCIUM 8.2* 8.3*    Micro Results: Recent Results (from the past 240 hour(s))  CULTURE, BLOOD (ROUTINE X 2)     Status: None   Collection Time    10/17/12 12:43 AM      Result Value Range Status    Specimen Description BLOOD RIGHT ANTECUBITAL   Final   Special Requests BOTTLES DRAWN AEROBIC AND ANAEROBIC 5CC   Final   Culture  Setup Time     Final   Value: 10/17/2012 05:01     Performed at [facility]   Culture     Final   Value:        BLOOD CULTURE RECEIVED NO GROWTH TO DATE CULTURE WILL BE HELD FOR 5 DAYS BEFORE ISSUING A FINAL NEGATIVE REPORT     Performed at [facility]   Report Status PENDING   Incomplete  WOUND CULTURE     Status: None   Collection Time    10/17/12 12:51 AM      Result Value Range Status   Specimen Description FOOT   Final   Special Requests NONE   Final   Gram Stain PENDING   Incomplete   Culture     Final   Value: FEW STAPHYLOCOCCUS AUREUS     Note: RIFAMPIN AND GENTAMICIN SHOULD NOT BE USED AS SINGLE DRUGS FOR TREATMENT OF STAPH INFECTIONS.     Performed at [facility]   Report Status PENDING   Incomplete  CULTURE, BLOOD (ROUTINE X 2)     Status: None   Collection Time    10/17/12 12:55 AM      Result Value Range Status   Specimen Description BLOOD RIGHT HAND   Final   Special Requests BOTTLES DRAWN AEROBIC AND ANAEROBIC 1CC   Final   Culture  Setup Time     Final   Value: 10/17/2012 05:01     Performed at [facility]   Culture     Final   Value:        BLOOD CULTURE RECEIVED NO GROWTH TO DATE CULTURE WILL BE HELD FOR 5 DAYS BEFORE ISSUING A FINAL NEGATIVE REPORT     Performed at [facility]   Report Status PENDING   Incomplete  MRSA PCR SCREENING     Status: None   Collection Time    10/17/12  2:34 AM      Result Value Range Status   MRSA by PCR NEGATIVE  NEGATIVE Final   Comment:            The GeneXpert MRSA Assay (FDA     approved for NASAL specimens     only), is one component of a     comprehensive MRSA colonization     surveillance program. It  is not     intended to diagnose MRSA     infection nor to guide or     monitor treatment for     MRSA infections.    Studies/Results: No  results found.    Assessment/Plan: Angelica Lawrence is a 79 y.o. 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   At this time Orthopedics is NOT recommending surgical intervention and I imagine this is due to concerns about risk of morbidity of the procedure which would leave her without a leg on either side.   She has had prosthesis on the left but not used for months due to poor fit and had needed a new one made  For now continue with broad spectrum abx (vanco and now cefepime)  Again would recommend that Orthopedics discuss case with family and patient on Monday. She states her family and in particular daughter think she would be better off with BKA but PATIENT herself is reluctant and again I think thoughtful discussion with family and pt would be helpful.  Dr. Luciana Axe back tomorrow.   LOS: 3 days   Acey Lav 10/19/2012, 3:22 PM

## 2012-10-19 NOTE — Progress Notes (Signed)
ANTICOAGULATION CONSULT NOTE - Follow Up  Pharmacy Consult for:  Coumadin/Lovenox Indication:  Treatment of DVT  No Known Allergies  Patient Measurements: Height: 5' 6.93" (170 cm) Weight: 134 lb 9.6 oz (61.054 kg) IBW/kg (Calculated) : 61.44   Vital Signs: Temp: 99.1 F (37.3 C) (08/10 0620) Temp src: Oral (08/10 0620) BP: 125/79 mmHg (08/10 0620) Pulse Rate: 95 (08/10 0620)  Labs:  Recent Labs  10/16/12 2045 10/17/12 0043 10/17/12 0645 10/17/12 1140 10/18/12 1048 10/19/12 0508 10/19/12 0944  HGB 11.3*  --   --   --  9.8*  --   --   HCT 33.3*  --   --   --  29.5*  --   --   PLT 203  --   --   --  218  --   --   APTT 31  --   --   --   --   --   --   LABPROT 14.7  --   --   --   --  15.4*  --   INR 1.17  --   --   --   --  1.25  --   CREATININE 2.96*  --  2.75*  --  2.02*  --  1.52*  TROPONINI <0.30 <0.30 <0.30 <0.30  --   --   --     Estimated Creatinine Clearance: 26.6 ml/min (by C-G formula based on Cr of 1.52).   Medical History: Past Medical History  Diagnosis Date  . ALLERGIC RHINITIS 12/30/2006  . ANEMIA, IRON DEFICIENCY 10/21/2009  . COLONIC POLYPS, HX OF 12/30/2006  . CORONARY ARTERY DISEASE 12/30/2006  . DIABETES MELLITUS, TYPE I 12/30/2006  . DIVERTICULOSIS, COLON 12/30/2006  . HYPERLIPIDEMIA 12/30/2006  . HYPERTENSION 12/30/2006  . PERIPHERAL NEUROPATHY 12/30/2006  . PERIPHERAL VASCULAR DISEASE 12/30/2006  . GOITER 12/30/2006  . VITAMIN B12 DEFICIENCY 09/16/2008  . Carpal tunnel syndrome 12/30/2006  . CATARACTS, BILATERAL 12/30/2006  . HEMORRHOIDS 12/30/2006  . DEGENERATIVE JOINT DISEASE 02/10/2007  . INSOMNIA 10/21/2009  . CORNEAL TRANSPLANT 12/30/2006  . BKA, LEFT LEG 12/30/2006    Medications:  Scheduled:  . ceFEPime (MAXIPIME) IV  1 g Intravenous Q24H  . [MAR HOLD] enoxaparin (LOVENOX) injection  60 mg Subcutaneous Q24H  . [facility] HOLD] feeding supplement  237 mL Oral BID BM  . [facility] HOLD] ferrous sulfate  325 mg Oral Daily  . [MAR  HOLD] insulin aspart  0-5 Units Subcutaneous QHS  . [MAR HOLD] insulin aspart  0-9 Units Subcutaneous TID WC  . [facility] HOLD] Linaclotide  145 mcg Oral Daily  . [MAR HOLD] pantoprazole  40 mg Oral Daily  . [facility] HOLD] simvastatin  40 mg Oral QHS  . [MAR HOLD] sodium chloride  500 mL Intravenous Once  . [MAR HOLD] sodium chloride  3 mL Intravenous Q12H  . [facility] HOLD] traZODone  100 mg Oral QHS  . vancomycin  750 mg Intravenous Q24H  . [facility] HOLD] warfarin   Does not apply Once  . Warfarin - Pharmacist Dosing Inpatient   Does not apply q1800    Assessment:  Asked to assist with Coumadin therapy for this 79 year-old AA female with DVT of right LE (confirmed on doppler), currently receiving Lovenox.  VQ scan result is very low probability of pulmonary embolism.  Day #2/5 minimal overlap of Lovenox/warfarin.   INR 1.25  Hgb 9.8, Platelets 218K. No bleeding/complications reported.  SCr improving, CrCl ~26 ml/min   Goal of Therapy:  INR 2-3  5 day minimum overlap of warfarin/Lovenox and INR>2 for at least 24 hours prior to discontinuation of Lovenox   Plan:   Warfarin 5 mg dose today.  Continue Lovenox 60 mg SQ q24h.  Follow PT/INR daily.  Coumadin education for patient and/or family to follow.  Clance Boll, PharmD, BCPS Pager: [phone] 10/19/2012 1:16 PM

## 2012-10-20 DIAGNOSIS — M869 Osteomyelitis, unspecified: Secondary | ICD-10-CM

## 2012-10-20 LAB — PROTIME-INR
INR: 1.52 — ABNORMAL HIGH (ref 0.00–1.49)
Prothrombin Time: 17.9 seconds — ABNORMAL HIGH (ref 11.6–15.2)

## 2012-10-20 LAB — CBC
Hemoglobin: 9.5 g/dL — ABNORMAL LOW (ref 12.0–15.0)
MCH: 29.6 pg (ref 26.0–34.0)
MCHC: 33.3 g/dL (ref 30.0–36.0)
Platelets: 222 10*3/uL (ref 150–400)
RDW: 14.1 % (ref 11.5–15.5)

## 2012-10-20 LAB — WOUND CULTURE: Gram Stain: NONE SEEN

## 2012-10-20 LAB — GLUCOSE, CAPILLARY

## 2012-10-20 LAB — BASIC METABOLIC PANEL
Calcium: 8.7 mg/dL (ref 8.4–10.5)
GFR calc Af Amer: 44 mL/min — ABNORMAL LOW (ref >90)
GFR calc non Af Amer: 38 mL/min — ABNORMAL LOW (ref >90)
Glucose, Bld: 147 mg/dL — ABNORMAL HIGH (ref 70–99)
Potassium: 4.2 mEq/L (ref 3.5–5.1)
Sodium: 136 mEq/L (ref 135–145)

## 2012-10-20 MED ORDER — WARFARIN SODIUM 4 MG PO TABS
4.0000 mg | ORAL_TABLET | Freq: Once | ORAL | Status: AC
  Administered 2012-10-20: 4 mg via ORAL
  Filled 2012-10-20: qty 1

## 2012-10-20 MED ORDER — HYDROMORPHONE HCL PF 1 MG/ML IJ SOLN
0.5000 mg | INTRAMUSCULAR | Status: DC | PRN
  Administered 2012-10-21: 0.5 mg via INTRAVENOUS
  Filled 2012-10-20: qty 1

## 2012-10-20 MED ORDER — ENOXAPARIN SODIUM 60 MG/0.6ML ~~LOC~~ SOLN
60.0000 mg | Freq: Two times a day (BID) | SUBCUTANEOUS | Status: DC
  Administered 2012-10-20 – 2012-10-21 (×3): 60 mg via SUBCUTANEOUS
  Filled 2012-10-20 (×6): qty 0.6

## 2012-10-20 NOTE — Progress Notes (Signed)
Came to speak with patient/family about PICC placement. States daughter just left and would be back tomorrow. Patient states she wants to wait until the whole family can discuss before agreeing to anything. Primary RN notified.

## 2012-10-20 NOTE — Progress Notes (Signed)
PHARMACY BRIEF NOTE  As documented in previous note from today, patient apparently did not receive Lovenox yesterday because much of her MAR was in [facility] HOLD] mode.  I contacted [phone] and reported this situation.  They were able to work with nursing staff to rectify this and all ordered medications are now active again.  Elie Goody, PharmD, BCPS Pager: [phone] 10/20/2012  2:52 PM

## 2012-10-20 NOTE — Progress Notes (Signed)
[facility] PROGRESS NOTE  Angelica Lawrence ZOX:[id number] DOB: 1926/10/04 DOA: 10/16/2012 PCP: [name], MD  Assessment/Plan: 1-R lateral malleolar osteomyelitis/fracture: CAM walker at bedside per ortho. Continue with  vanc  day 5. Ortho recommend medical management. Cipro change to cefepime. Appreciate Dr Daiva Eves help. She will need PICC.  ortho to speak with the family today. Will order PICC line if no surgery is planned. Marland Kitchen Antibiotics until sep 19.   2-Right: DVT noted in the common femoral vein and saphenofemoral junction. Partial DVT in the popliteal vein. There appears to be superficial thrombus in the proximal greater saphenous vein. Doppler positive for DVT right LE. Continue with Lovenox.  VQ scan low probability. Continue with coumadin. Day 5 Lovenox. INR at 1.5.  3-Anemia: likely anemia of chronic disease. Continue to follow.  4-Acute on CKD: Baseline Cr 1.2-1.7. Cr on admission at 2.9. Continue with IV fluids. Avoid hypotension. Cr decrease to 1.2 5-Hypotension : in setting of infection. Respond to IV fluids. Monitor. Hold nitrates.  6-Hyponatremia: Continue with NS IV fluids. Resolved.   Code Status: DNR Family Communication: Care discussed with daughter.   Disposition Plan: home tomorrow if no surgery is planned.    Consultants:  Dr Eulah Pont  Procedures:  Doppler Right Lower extremity positive for DVT  Antibiotics:  Vancomycin 8-7  Ciprofloxacin 8-7  HPI/Subjective: Feeling well, tolerating diet. No pain on right LE.   Objective: Filed Vitals:   10/20/12 0550  BP: 112/70  Pulse: 92  Temp: 98.6 F (37 C)  Resp: 18    Intake/Output Summary (Last 24 hours) at 10/20/12 1409 Last data filed at 10/20/12 0835  Gross per 24 hour  Intake   2225 ml  Output    100 ml  Net   2125 ml   Filed Weights   10/16/12 2325  Weight: 61.054 kg (134 lb 9.6 oz)    Exam:   General:  No distress.   Cardiovascular: S 1, S 2 RRR  Respiratory: CTA  Abdomen: Bs  present, soft, NT  Musculoskeletal: Right LE with malleolar ulcer, redness.   Data Reviewed: Basic Metabolic Panel:  Recent Labs Lab 10/16/12 2045 10/17/12 0645 10/18/12 1048 10/19/12 0944 10/20/12 0450  NA 131* 131* 128* 133* 136  K 3.9 3.6 3.8 4.1 4.2  CL 96 98 96 103 106  CO2 21 22 20  18* 22  GLUCOSE 207* 190* 151* 146* 147*  BUN 47* 46* 40* 34* 27*  CREATININE 2.96* 2.75* 2.02* 1.52* 1.27*  CALCIUM 8.9 8.2* 8.2* 8.3* 8.7   Liver Function Tests:  Recent Labs Lab 10/16/12 2045  AST 14  ALT 6  ALKPHOS 69  BILITOT 0.8  PROT 6.4  ALBUMIN 2.5*   No results found for this basename: LIPASE, AMYLASE,  in the last 168 hours No results found for this basename: AMMONIA,  in the last 168 hours CBC:  Recent Labs Lab 10/16/12 2045 10/18/12 1048 10/20/12 0450  WBC 9.2 7.7 6.1  NEUTROABS 6.5  --   --   HGB 11.3* 9.8* 9.5*  HCT 33.3* 29.5* 28.5*  MCV 88.8 89.9 88.8  PLT 203 218 222   Cardiac Enzymes:  Recent Labs Lab 10/16/12 2045 10/17/12 0043 10/17/12 0645 10/17/12 1140  TROPONINI <0.30 <0.30 <0.30 <0.30   BNP (last 3 results)  Recent Labs  10/16/12 2045  PROBNP 1528.0*   CBG:  Recent Labs Lab 10/19/12 1124 10/19/12 1651 10/19/12 2145 10/20/12 0757 10/20/12 1155  GLUCAP 161* 163* 192* 132* 167*  Recent Results (from the past 240 hour(s))  CULTURE, BLOOD (ROUTINE X 2)     Status: None   Collection Time    10/17/12 12:43 AM      Result Value Range Status   Specimen Description BLOOD RIGHT ANTECUBITAL   Final   Special Requests BOTTLES DRAWN AEROBIC AND ANAEROBIC 5CC   Final   Culture  Setup Time     Final   Value: 10/17/2012 05:01     Performed at [facility]   Culture     Final   Value:        BLOOD CULTURE RECEIVED NO GROWTH TO DATE CULTURE WILL BE HELD FOR 5 DAYS BEFORE ISSUING A FINAL NEGATIVE REPORT     Performed at [facility]   Report Status PENDING   Incomplete  WOUND CULTURE     Status: None   Collection  Time    10/17/12 12:51 AM      Result Value Range Status   Specimen Description FOOT   Final   Special Requests NONE   Final   Gram Stain     Final   Value: NO WBC SEEN     NO SQUAMOUS EPITHELIAL CELLS SEEN     RARE GRAM POSITIVE COCCI     IN PAIRS     Performed at [facility]   Culture     Final   Value: FEW STAPHYLOCOCCUS AUREUS     Note: RIFAMPIN AND GENTAMICIN SHOULD NOT BE USED AS SINGLE DRUGS FOR TREATMENT OF STAPH INFECTIONS.     Performed at [facility]   Report Status 10/20/2012 FINAL   Final   Organism ID, Bacteria STAPHYLOCOCCUS AUREUS   Final  CULTURE, BLOOD (ROUTINE X 2)     Status: None   Collection Time    10/17/12 12:55 AM      Result Value Range Status   Specimen Description BLOOD RIGHT HAND   Final   Special Requests BOTTLES DRAWN AEROBIC AND ANAEROBIC 1CC   Final   Culture  Setup Time     Final   Value: 10/17/2012 05:01     Performed at [facility]   Culture     Final   Value:        BLOOD CULTURE RECEIVED NO GROWTH TO DATE CULTURE WILL BE HELD FOR 5 DAYS BEFORE ISSUING A FINAL NEGATIVE REPORT     Performed at [facility]   Report Status PENDING   Incomplete  MRSA PCR SCREENING     Status: None   Collection Time    10/17/12  2:34 AM      Result Value Range Status   MRSA by PCR NEGATIVE  NEGATIVE Final   Comment:            The GeneXpert MRSA Assay (FDA     approved for NASAL specimens     only), is one component of a     comprehensive MRSA colonization     surveillance program. It is not     intended to diagnose MRSA     infection nor to guide or     monitor treatment for     MRSA infections.     Studies: No results found.  Scheduled Meds: . ceFEPime (MAXIPIME) IV  1 g Intravenous Q24H  . enoxaparin (LOVENOX) injection  60 mg Subcutaneous Q12H  . feeding supplement  237 mL Oral BID BM  . ferrous sulfate  325 mg Oral Daily  .  insulin aspart  0-5 Units Subcutaneous QHS  . insulin aspart  0-9 Units  Subcutaneous TID WC  . Linaclotide  145 mcg Oral Daily  . pantoprazole  40 mg Oral Daily  . simvastatin  40 mg Oral QHS  . sodium chloride  500 mL Intravenous Once  . sodium chloride  3 mL Intravenous Q12H  . traZODone  100 mg Oral QHS  . vancomycin  750 mg Intravenous Q24H  . warfarin  4 mg Oral ONCE-1800  . warfarin   Does not apply Once  . Warfarin - Pharmacist Dosing Inpatient   Does not apply q1800   Continuous Infusions: . sodium chloride 75 mL/hr at 10/19/12 1856    Principal Problem:   Hyponatremia Active Problems:   Anemia   Closed fibular fracture   Decubitus ulcer, ankle   Hyperglycemia   Malnutrition   Protein-calorie malnutrition, severe   Acute on chronic renal failure    Time spent: 35 minutes.     Angelica Lawrence,Angelica Lawrence  [facility] Pager [phone]. If 7PM-7AM, please contact night-coverage at [email or web address], password [facility] 10/20/2012, 2:09 PM  LOS: 4 days

## 2012-10-20 NOTE — Progress Notes (Signed)
[facility]  Date of Admission:  10/16/2012  Antibiotics: Vancomycin day 4 Cefepime day 3 cipro x 1 day  Subjective: No complaints, no pain, no fever or chills  Objective: Temp:  [98.6 F (37 C)-99.4 F (37.4 C)] 98.6 F (37 C) (08/11 0550) Pulse Rate:  [84-92] 92 (08/11 0550) Resp:  [18] 18 (08/11 0550) BP: (95-112)/(53-70) 112/70 mmHg (08/11 0550) SpO2:  [100 %] 100 % (08/11 0550)  General: Awake, alert, nad Skin: no rashes Lungs: CTA B Cor: RRR without m Abdomen: soft, nt, nd, +bs Right extremity with 2 cm ulcer, no surrounding erythema; left extremity AKA  Lab Results Lab Results  Component Value Date   WBC 6.1 10/20/2012   HGB 9.5* 10/20/2012   HCT 28.5* 10/20/2012   MCV 88.8 10/20/2012   PLT 222 10/20/2012    Lab Results  Component Value Date   CREATININE 1.27* 10/20/2012   BUN 27* 10/20/2012   NA 136 10/20/2012   K 4.2 10/20/2012   CL 106 10/20/2012   CO2 22 10/20/2012    Lab Results  Component Value Date   ALT 6 10/16/2012   AST 14 10/16/2012   ALKPHOS 69 10/16/2012   BILITOT 0.8 10/16/2012      Microbiology: Recent Results (from the past 240 hour(s))  CULTURE, BLOOD (ROUTINE X 2)     Status: None   Collection Time    10/17/12 12:43 AM      Result Value Range Status   Specimen Description BLOOD RIGHT ANTECUBITAL   Final   Special Requests BOTTLES DRAWN AEROBIC AND ANAEROBIC 5CC   Final   Culture  Setup Time     Final   Value: 10/17/2012 05:01     Performed at [facility]   Culture     Final   Value:        BLOOD CULTURE RECEIVED NO GROWTH TO DATE CULTURE WILL BE HELD FOR 5 DAYS BEFORE ISSUING A FINAL NEGATIVE REPORT     Performed at [facility]   Report Status PENDING   Incomplete  WOUND CULTURE     Status: None   Collection Time    10/17/12 12:51 AM      Result Value Range Status   Specimen Description FOOT   Final   Special Requests NONE   Final   Gram Stain     Final   Value: NO WBC SEEN     NO  SQUAMOUS EPITHELIAL CELLS SEEN     RARE GRAM POSITIVE COCCI     IN PAIRS     Performed at [facility]   Culture     Final   Value: FEW STAPHYLOCOCCUS AUREUS     Note: RIFAMPIN AND GENTAMICIN SHOULD NOT BE USED AS SINGLE DRUGS FOR TREATMENT OF STAPH INFECTIONS.     Performed at [facility]   Report Status 10/20/2012 FINAL   Final   Organism ID, Bacteria STAPHYLOCOCCUS AUREUS   Final  CULTURE, BLOOD (ROUTINE X 2)     Status: None   Collection Time    10/17/12 12:55 AM      Result Value Range Status   Specimen Description BLOOD RIGHT HAND   Final   Special Requests BOTTLES DRAWN AEROBIC AND ANAEROBIC 1CC   Final   Culture  Setup Time     Final   Value: 10/17/2012 05:01     Performed at [facility]  Final   Value:        BLOOD CULTURE RECEIVED NO GROWTH TO DATE CULTURE WILL BE HELD FOR 5 DAYS BEFORE ISSUING A FINAL NEGATIVE REPORT     Performed at [facility]   Report Status PENDING   Incomplete  MRSA PCR SCREENING     Status: None   Collection Time    10/17/12  2:34 AM      Result Value Range Status   MRSA by PCR NEGATIVE  NEGATIVE Final   Comment:            The GeneXpert MRSA Assay (FDA     approved for NASAL specimens     only), is one component of a     comprehensive MRSA colonization     surveillance program. It is not     intended to diagnose MRSA     infection nor to guide or     monitor treatment for     MRSA infections.    Studies/Results: No results found.  Assessment/Plan: 1) osteomyelitis - I would continue with current antibiotics of vancomycin per home health protocol (trough goal 15-20 and cefepime renally doses at 1 gram daily for 6 weeks through September 19th and decision if continuation/suppressive antibiotics indicated can be evaluated at that time.   -weekly cbc, cmp, results to [facility] -we will arrange follow up with [facility] in about 2-3 weeks  Thanks for the consult, call with questions  Staci Righter,  MD [email or web address] [phone] pager   [phone] cell 10/20/2012, 1:10 PM

## 2012-10-20 NOTE — Progress Notes (Signed)
ANTICOAGULATION CONSULT NOTE - Follow Up  Pharmacy Consult for:  Warfarin / Lovenox Indication:  Treatment of DVT  No Known Allergies  Patient Measurements: Height: 5' 6.93" (170 cm) Weight: 134 lb 9.6 oz (61.054 kg) IBW/kg (Calculated) : 61.44   Vital Signs: Temp: 98.6 F (37 C) (08/11 0550) Temp src: Oral (08/11 0550) BP: 112/70 mmHg (08/11 0550) Pulse Rate: 92 (08/11 0550)  Labs:  Recent Labs  10/17/12 1140 10/18/12 1048 10/19/12 0508 10/19/12 0944 10/20/12 0450  HGB  --  9.8*  --   --  9.5*  HCT  --  29.5*  --   --  28.5*  PLT  --  218  --   --  222  LABPROT  --   --  15.4*  --  17.9*  INR  --   --  1.25  --  1.52*  CREATININE  --  2.02*  --  1.52* 1.27*  TROPONINI <0.30  --   --   --   --     Estimated Creatinine Clearance: 31.8 ml/min (by C-G formula based on Cr of 1.27).   Medical History: Past Medical History  Diagnosis Date  . ALLERGIC RHINITIS 12/30/2006  . ANEMIA, IRON DEFICIENCY 10/21/2009  . COLONIC POLYPS, HX OF 12/30/2006  . CORONARY ARTERY DISEASE 12/30/2006  . DIABETES MELLITUS, TYPE I 12/30/2006  . DIVERTICULOSIS, COLON 12/30/2006  . HYPERLIPIDEMIA 12/30/2006  . HYPERTENSION 12/30/2006  . PERIPHERAL NEUROPATHY 12/30/2006  . PERIPHERAL VASCULAR DISEASE 12/30/2006  . GOITER 12/30/2006  . VITAMIN B12 DEFICIENCY 09/16/2008  . Carpal tunnel syndrome 12/30/2006  . CATARACTS, BILATERAL 12/30/2006  . HEMORRHOIDS 12/30/2006  . DEGENERATIVE JOINT DISEASE 02/10/2007  . INSOMNIA 10/21/2009  . CORNEAL TRANSPLANT 12/30/2006  . BKA, LEFT LEG 12/30/2006    Medications:  Scheduled:  . ceFEPime (MAXIPIME) IV  1 g Intravenous Q24H  . enoxaparin (LOVENOX) injection  60 mg Subcutaneous Q12H  . [facility] HOLD] feeding supplement  237 mL Oral BID BM  . [facility] HOLD] ferrous sulfate  325 mg Oral Daily  . [MAR HOLD] insulin aspart  0-5 Units Subcutaneous QHS  . [MAR HOLD] insulin aspart  0-9 Units Subcutaneous TID WC  . [facility] HOLD] Linaclotide  145 mcg Oral Daily   . [MAR HOLD] pantoprazole  40 mg Oral Daily  . [facility] HOLD] simvastatin  40 mg Oral QHS  . [MAR HOLD] sodium chloride  500 mL Intravenous Once  . [MAR HOLD] sodium chloride  3 mL Intravenous Q12H  . [facility] HOLD] traZODone  100 mg Oral QHS  . vancomycin  750 mg Intravenous Q24H  . [facility] HOLD] warfarin   Does not apply Once  . Warfarin - Pharmacist Dosing Inpatient   Does not apply q1800    Assessment:  Asked to assist with warfarin and Lovenox therapy for this 79 year-old AA female with DVT of right LE (confirmed on doppler).  V/Q scan interpreted as very low probability of pulmonary embolism.  Day #3 overlap of Lovenox/warfarin.  Yesterday's Lovenox dose was not administered because many of her medications, including Lovenox, were listed as [MAR HOLD].  Unclear why, but will investigate.  INR responding - 1.52 today.  Warfarin doses administered to date were 5mg  (8/9), 5mg  (8/10).  Hgb, Hct, Pltc stabe. No bleeding reported in chart notes.  SCr continues to improve, now back to baseline.  Estimated CrCl now > 30 mL/min.   Goal of Therapy:   INR 2-3  5 day minimum overlap of warfarin/Lovenox and INR  2 or above on two consecutive days prior to discontinuation of Lovenox   Plan:   Reduce warfarin to 4 mg today.  Increase Lovenox to 60 mg SQ q12h for CrCl > 30 mL/min - next dose this AM.  Follow PT/INR daily.  BMet in AM to follow SCr.  Warfarin education for patient and/or family to follow.  Elie Goody, PharmD, BCPS Pager: [phone] 10/20/2012  8:49 AM

## 2012-10-20 NOTE — Progress Notes (Signed)
Met with pt and daughter Victorino Dike at bedside to discuss [facility] services. They chose [facility] to provide the [facility] services including IV antibiotics. She lives with her but also has the assistance of her father and 2 brothers. Referral mae to [facility].  Algernon Huxley RN BSN  [phone]

## 2012-10-20 NOTE — Evaluation (Signed)
Physical Therapy Evaluation Patient Details Name: Angelica Lawrence MRN: [id number] DOB: 22-Jan-1927 Today's Date: 10/20/2012 Time: 8657-8469 PT Time Calculation (min): 29 min  PT Assessment / Plan / Recommendation History of Present Illness  79 yo female admitted with R ankle osteomyelitis/fracture, R LE DVT. Hx of L BKA-has prosthesis but pt has not been using for last few months.   Clinical Impression  On eval, pt required significant +2 assist for mobility to perform SB transfers bed<>wheelchair. Demonstrates general UE/LE weakness and decreased activity tolerance affecting pt's ability to mobilize. Daughter present during eval and assisted with mobility as well. Discussed current level of care, transfer options (anterior-posterior, scoot), NWB status, and possible need for ST rehab to improve pt's strength/functional mobility and to decrease burden of care on daughter/family. Explained that pt will need to work with HHPT, and likely have updated WB status, before being able to stand safely with family. Both pt and daughter decline SNF at this time-report pt has good family support.    PT Assessment  Patient needs continued PT services    Follow Up Recommendations  SNF (discussed as possible d/c plan but pt/daughter decline). So, recommend Home health PT/OT;Supervision/Assistance - 24 hour    Does the patient have the potential to tolerate intense rehabilitation      Barriers to Discharge        Equipment Recommendations  Hospital bed;Wheelchair;Wheelchair cushion;Sliding board; elevating R LE legrest. May need to consider lift equipment as well. Ambulance transport home.    Recommendations for Other Services     Frequency Min 3X/week    Precautions / Restrictions Precautions Precautions: Fall Restrictions Weight Bearing Restrictions: Yes Other Position/Activity Restrictions: NWB. CAM boot   Pertinent Vitals/Pain "bottom" - pt states bottom pain is worse than R ankle/LE pain.  Unrated.       Mobility  Bed Mobility Bed Mobility: Supine to Sit;Sit to Supine Supine to Sit: HOB elevated;With rails;1: +2 Total assist Supine to Sit: Patient Percentage: 40% Sit to Supine: 1: +2 Total assist;HOB flat Sit to Supine: Patient Percentage: 20% Details for Bed Mobility Assistance: Assist for bil LEs and trunk. Increased time. Utilized bedpad for scooting, positioning.  Transfers Transfers: Lateral/Scoot Transfers Lateral/Scoot Transfers: With [employer];With armrests removed;From elevated surface;1: +2 Total assist Lateral Transfers: Patient Percentage: 20% Details for Transfer Assistance: Sliding board. x2, bed<>wheelchair with L armrest removed. Increased time. Pt with poor use of UEs (due to weakness) to aid with scooting. Utilized bedpad to assist. Assist to shift weight anteriorly and to monitor/guard R LE/foot. Pillow placed under R foot to aid with guarding/support.  Wheelchair Mobility Wheelchair Mobility: No    Exercises     PT Diagnosis: Generalized weakness;Acute pain  PT Problem List: Decreased strength;Decreased activity tolerance;Decreased mobility;Pain;Decreased knowledge of use of DME;Decreased knowledge of precautions PT Treatment Interventions: DME instruction;Functional mobility training;Therapeutic activities;Therapeutic exercise;Patient/family education;Wheelchair mobility training     PT Goals(Current goals can be found in the care plan section) Acute Rehab PT Goals Patient Stated Goal: home PT Goal Formulation: With patient/family Time For Goal Achievement: 11/03/12 Potential to Achieve Goals: Fair  Visit Information  Last PT Received On: 10/20/12 Assistance Needed: +2 History of Present Illness: 79 yo female admitted with R ankle osteomyelitis/fracture, R LE DVT. Hx of L BKA-has prosthesis but pt has not been using for last few months.        Prior Functioning  Home Living Family/patient expects to be discharged to:: Private  residence Living Arrangements: Spouse/significant other;Children Available Help at  Discharge: Family Type of Home: House Home Access: Ramped entrance Home Layout: One level Home Equipment: Walker - 2 wheels;Wheelchair - manual;Bedside commode Prior Function Level of Independence: Needs assistance Gait / Transfers Assistance Needed: total assist for transfers bed<> WC,BSC ADL's / Homemaking Assistance Needed: total assist    Cognition  Cognition Arousal/Alertness: Awake/alert Behavior During Therapy: WFL for tasks assessed/performed Overall Cognitive Status: Within Functional Limits for tasks assessed    Extremity/Trunk Assessment Upper Extremity Assessment Upper Extremity Assessment: Generalized weakness Lower Extremity Assessment Lower Extremity Assessment: LLE deficits/detail;RLE deficits/detail RLE Deficits / Details: in Cam boot. Knee ext 3-/5. Noted R LE externally rotated at rest.  RLE: Unable to fully assess due to immobilization LLE Deficits / Details: L BKA  Cervical / Trunk Assessment Cervical / Trunk Assessment: Kyphotic   Balance Balance Balance Assessed: Yes Static Sitting Balance Static Sitting - Balance Support: Bilateral upper extremity supported;Feet supported Static Sitting - Level of Assistance: 5: Stand by assistance  End of Session PT - End of Session Equipment Utilized During Treatment: Gait belt Activity Tolerance: Patient limited by fatigue;Patient limited by pain Patient left: in bed;with call bell/phone within reach;with family/visitor present  GP     Rebeca Alert, MPT Pager: [phone]

## 2012-10-20 NOTE — Progress Notes (Signed)
Spoke with daughter; states family now leaning towards pt going to rehab

## 2012-10-21 LAB — GLUCOSE, CAPILLARY: Glucose-Capillary: 145 mg/dL — ABNORMAL HIGH (ref 70–99)

## 2012-10-21 LAB — CREATININE, SERUM
Creatinine, Ser: 1.06 mg/dL (ref 0.50–1.10)
GFR calc Af Amer: 54 mL/min — ABNORMAL LOW (ref >90)
GFR calc non Af Amer: 47 mL/min — ABNORMAL LOW (ref >90)

## 2012-10-21 MED ORDER — WARFARIN SODIUM 2 MG PO TABS: 2.0000 mg | ORAL_TABLET | Freq: Every day | ORAL | Status: DC

## 2012-10-21 MED ORDER — SODIUM CHLORIDE 0.9 % IJ SOLN
10.0000 mL | INTRAMUSCULAR | Status: DC | PRN
  Administered 2012-10-21: 10 mL

## 2012-10-21 MED ORDER — VANCOMYCIN HCL IN DEXTROSE 750-5 MG/150ML-% IV SOLN: 750.0000 mg | INTRAVENOUS | Status: DC

## 2012-10-21 MED ORDER — DEXTROSE 5 % IV SOLN: 1.0000 g | INTRAVENOUS | Status: DC

## 2012-10-21 MED ORDER — ENSURE COMPLETE PO LIQD: 237.0000 mL | Freq: Two times a day (BID) | ORAL | Status: DC

## 2012-10-21 MED ORDER — WARFARIN SODIUM 2 MG PO TABS
2.0000 mg | ORAL_TABLET | Freq: Once | ORAL | Status: DC
  Filled 2012-10-21: qty 1

## 2012-10-21 MED ORDER — HYDROCODONE-ACETAMINOPHEN 5-325 MG PO TABS: 1.0000 | ORAL_TABLET | ORAL | Status: DC | PRN

## 2012-10-21 MED ORDER — ENOXAPARIN SODIUM 60 MG/0.6ML ~~LOC~~ SOLN: 60.0000 mg | Freq: Two times a day (BID) | SUBCUTANEOUS | Status: DC

## 2012-10-21 NOTE — Clinical Social Work Placement (Signed)
     Clinical Social Work Department CLINICAL SOCIAL WORK PLACEMENT NOTE 10/21/2012  Patient:  Angelica Lawrence, Angelica Lawrence  Account Number:  [id number] Admit date:  10/16/2012  Clinical Social Worker:  Becky Sax, LCSW  Date/time:  10/21/2012 12:00 M  Clinical Social Work is seeking post-discharge placement for this patient at the following level of care:   SKILLED NURSING   (*CSW will update this form in Epic as items are completed)   10/21/2012  Patient/family provided with [facility] Department of Clinical Social Works list of facilities offering this level of care within the geographic area requested by the patient (or if unable, by the patients family).  10/21/2012  Patient/family informed of their freedom to choose among providers that offer the needed level of care, that participate in Medicare, Medicaid or managed care program needed by the patient, have an available bed and are willing to accept the patient.  10/21/2012  Patient/family informed of [facility] ownership interest in [facility], as well as of the fact that they are under no obligation to receive care at this facility.  PASARR submitted to EDS on 10/21/2012 PASARR number received from EDS on 10/21/2012  FL2 transmitted to all facilities in geographic area requested by pt/family on  10/21/2012 FL2 transmitted to all facilities within larger geographic area on   Patient informed that his/her managed care company has contracts with or will negotiate with  certain facilities, including the following:     Patient/family informed of bed offers received:  10/21/2012 Patient chooses bed at [facility] [place] Physician recommends and patient chooses bed at    Patient to be transferred to [facility] [place] on  10/21/2012 Patient to be transferred to facility by ptar  The following physician request were entered in Epic:   Additional Comments:

## 2012-10-21 NOTE — Progress Notes (Signed)
Peripherally Inserted Central Catheter/Midline Placement  The IV Nurse has discussed with the patient and/or persons authorized to consent for the patient, the purpose of this procedure and the potential benefits and risks involved with this procedure.  The benefits include less needle sticks, lab draws from the catheter and patient may be discharged home with the catheter.  Risks include, but not limited to, infection, bleeding, blood clot (thrombus formation), and puncture of an artery; nerve damage and irregular heat beat.  Alternatives to this procedure were also discussed.  PICC/Midline Placement Documentation  PICC / Midline Single Lumen 10/21/12 PICC Right Basilic (Active)  Dressing Change Due 10/28/12 10/21/2012 12:00 PM       Stacie Glaze Horton 10/21/2012, 12:08 PM

## 2012-10-21 NOTE — Progress Notes (Signed)
ORTHOPAEDIC PROGRESS NOTE  REQUESTING PHYSICIAN: Alba Cory, MD  Chief Complaint: Pressure wound RLE, nondisplaced fibula fracture  HPI: Angelica Lawrence is a 79 y.o. female who complains of  Min pain  Past Medical History  Diagnosis Date  . ALLERGIC RHINITIS 12/30/2006  . ANEMIA, IRON DEFICIENCY 10/21/2009  . COLONIC POLYPS, HX OF 12/30/2006  . CORONARY ARTERY DISEASE 12/30/2006  . DIABETES MELLITUS, TYPE I 12/30/2006  . DIVERTICULOSIS, COLON 12/30/2006  . HYPERLIPIDEMIA 12/30/2006  . HYPERTENSION 12/30/2006  . PERIPHERAL NEUROPATHY 12/30/2006  . PERIPHERAL VASCULAR DISEASE 12/30/2006  . GOITER 12/30/2006  . VITAMIN B12 DEFICIENCY 09/16/2008  . Carpal tunnel syndrome 12/30/2006  . CATARACTS, BILATERAL 12/30/2006  . HEMORRHOIDS 12/30/2006  . DEGENERATIVE JOINT DISEASE 02/10/2007  . INSOMNIA 10/21/2009  . CORNEAL TRANSPLANT 12/30/2006  . BKA, LEFT LEG 12/30/2006   Past Surgical History  Procedure Laterality Date  . Appendectomy    . Cholecystectomy    . Abdominal hysterectomy  1960's    and bso  . Below knee leg amputation  1989    L   History   Social History  . Marital Status: Widowed    Spouse Name: N/A    Number of Children: N/A  . Years of Education: N/A   Occupational History  . Retired    Social History Main Topics  . Smoking status: Never Smoker   . Smokeless tobacco: None  . Alcohol Use: No  . Drug Use: No  . Sexually Active: None   Other Topics Concern  . None   Social History Narrative  . None   Family History  Problem Relation Age of Onset  . Cancer Neg Hx   . Heart disease Neg Hx    No Known Allergies Prior to Admission medications   Medication Sig Start Date End Date Taking? Authorizing Provider  aspirin 81 MG tablet Take 81 mg by mouth daily.     Yes Historical Provider, MD  atenolol (TENORMIN) 25 MG tablet Take 1 tablet (25 mg total) by mouth daily. 08/18/12  Yes [name], MD  ferrous sulfate ([facility] IRON) 325 (65 FE) MG  tablet Take 325 mg by mouth daily.     Yes Historical Provider, MD  isosorbide mononitrate (ISMO,MONOKET) 10 MG tablet Take 10 mg by mouth daily.   Yes Historical Provider, MD  Linaclotide (LINZESS) 145 MCG CAPS Take 145 mcg by mouth daily.   Yes Historical Provider, MD  losartan-hydrochlorothiazide (HYZAAR) 100-25 MG per tablet Take 1 tablet by mouth daily. 08/18/12  Yes [name], MD  omeprazole (PRILOSEC) 20 MG capsule Take 1 capsule (20 mg total) by mouth daily. 08/18/12  Yes [name], MD  simvastatin (ZOCOR) 40 MG tablet Take 1 tablet (40 mg total) by mouth at bedtime. 08/18/12  Yes [name], MD  traZODone (DESYREL) 100 MG tablet Take 1 tablet (100 mg total) by mouth at bedtime. 04/17/12  Yes [name], MD   No results found.  Positive ROS: All other systems have been reviewed and were otherwise negative with the exception of those mentioned in the HPI and as above.  Physical Exam: Filed Vitals:   10/21/12 0616  BP: 120/67  Pulse: 95  Temp: 99.3 F (37.4 C)  Resp: 18   General: Alert, no acute distress Cardiovascular: No pedal edema Respiratory: No cyanosis, no use of accessory musculature GI: No organomegaly, abdomen is soft and non-tender Skin: 3cm pressure wound, dressing C/D/I Neurologic: Sensation intact distally Psychiatric: Patient is oriented  to self and place Lymphatic: No axillary or cervical lymphadenopathy  MUSCULOSKELETAL:  RLE: dressing C/D/I, wiggles toes. Diminished sensation   Assessment: Pressure wound and nondisplaced fibula fracture   Plan: Continue wound care per wound team Weight Bearing Status: WBAT in boot May remove boot for wound care PT VTE px: per primary team, no orthopaedic contraindication to chemical px F/u with me in 2wks for fracture care.    Margarita Rana, D, MD Cell [phone]   10/21/2012 8:39 AM

## 2012-10-21 NOTE — Progress Notes (Signed)
Patient cleared for discharge. Packet copied and placed in [place]. ptar called for transportation. Patient and daughter understand no guarantee of payment.  Vannesa Abair C. Ruffus Kamaka MSW, LCSW [phone]

## 2012-10-21 NOTE — Clinical Social Work Psychosocial (Signed)
     Clinical Social Work Department BRIEF PSYCHOSOCIAL ASSESSMENT 10/21/2012  Patient:  Angelica Lawrence, Angelica Lawrence     Account Number:  [id number]     Admit date:  10/16/2012  Clinical Social Worker:  Hattie Perch  Date/Time:  10/21/2012 12:00 M  Referred by:  Physician  Date Referred:  10/21/2012 Referred for  SNF Placement   Other Referral:   Interview type:  Patient Other interview type:   daughter at bedside.    PSYCHOSOCIAL DATA Living Status:  FAMILY Admitted from facility:   Level of care:   Primary support name:  Pamella Pert Primary support relationship to patient:  CHILD, ADULT Degree of support available:   good    CURRENT CONCERNS Current Concerns  Post-Acute Placement   Other Concerns:    SOCIAL WORK ASSESSMENT / PLAN CSW met with patient and patient's daughter at bedside. patient is alert and oriented X3. patient in need of snf placement. patient and daughter discussed and are agreeable to [facility] pending auth from [employer].   Assessment/plan status:   Other assessment/ plan:   Information/referral to community resources:    PATIENTS/FAMILYS RESPONSE TO PLAN OF CARE: patient is upset at not being able to go home but agreeable to going to [facility] for snf.

## 2012-10-21 NOTE — Progress Notes (Signed)
[facility]  Patient Status:  New patient for [facility] this admission  [facility] is providing the following services:   [facility] will provide RN, PT (if needed), and Home Infusion Services for home IV ABX.  [facility] Hospital Infusion Coordinator will provide in hospital teaching prior to DC home with daughter in order to support independence with  IV ABX at home.  If patient discharges after hours, please call [phone].   Sedalia Muta 10/21/2012, 9:23 AM

## 2012-10-21 NOTE — Progress Notes (Signed)
ANTICOAGULATION CONSULT NOTE - Follow Up  Pharmacy Consult for:  Warfarin / Lovenox Indication:  Treatment of DVT  No Known Allergies  Patient Measurements: Height: 5' 6.93" (170 cm) Weight: 134 lb 9.6 oz (61.054 kg) IBW/kg (Calculated) : 61.44   Vital Signs: Temp: 99.3 F (37.4 C) (08/12 0616) Temp src: Oral (08/12 0616) BP: 120/67 mmHg (08/12 0616) Pulse Rate: 95 (08/12 0616)  Labs:  Recent Labs  10/18/12 1048 10/19/12 0508 10/19/12 0944 10/20/12 0450 10/21/12 0533  HGB 9.8*  --   --  9.5*  --   HCT 29.5*  --   --  28.5*  --   PLT 218  --   --  222  --   LABPROT  --  15.4*  --  17.9* 24.8*  INR  --  1.25  --  1.52* 2.33*  CREATININE 2.02*  --  1.52* 1.27* 1.06    Estimated Creatinine Clearance: 38.1 ml/min (by C-G formula based on Cr of 1.06).   Assessment:  Asked to assist with warfarin and Lovenox therapy for this 79 year-old AA female with DVT of right LE (confirmed on doppler).  V/Q scan interpreted as very low probability of pulmonary embolism.  Day #4 overlap of Lovenox/warfarin.    INR now within therapeutic range at 2.33.  Warfarin doses administered to date were 5mg  (8/9), 5mg  (8/10), 4mg  (8/11).  Large INR jump after 3 doses, will reduce warfarin dose again today  Hgb, Hct, Pltc stabe. No bleeding reported in chart notes.  SCr continues to improve, now back to baseline.  Estimated CrCl remains > 30 mL/min.   Goal of Therapy:   INR 2-3  5 day minimum overlap of warfarin/Lovenox and INR 2 or above on two consecutive days prior to discontinuation of Lovenox   Plan:   Reduce warfarin to 2 mg today.  Continue Lovenox to 60 mg SQ q12h for CrCl > 30 mL/min - next dose this AM.  Follow PT/INR daily.  Thank you for the consult.  Tomi Bamberger, PharmD Clinical Pharmacist Pager: [phone] Pharmacy: [phone] 10/21/2012 7:47 AM

## 2012-10-21 NOTE — Progress Notes (Signed)
ANTIBIOTIC CONSULT NOTE - FOLLOW UP  Pharmacy Consult for cefepime, vancomycin Indication: osteomyelitis  No Known Allergies  Patient Measurements: Height: 5' 6.93" (170 cm) Weight: 134 lb 9.6 oz (61.054 kg) IBW/kg (Calculated) : 61.44   Vital Signs: Temp: 99.3 F (37.4 C) (08/12 0616) Temp src: Oral (08/12 0616) BP: 120/67 mmHg (08/12 0616) Pulse Rate: 95 (08/12 0616) Intake/Output from previous day: 08/11 0701 - 08/12 0700 In: 2355 [P.O.:480; I.V.:1875] Out: 100 [Urine:100] Intake/Output from this shift:    Labs:  Recent Labs  10/19/12 0944 10/20/12 0450 10/21/12 0533  WBC  --  6.1  --   HGB  --  9.5*  --   PLT  --  222  --   CREATININE 1.52* 1.27* 1.06   Estimated Creatinine Clearance: 38.1 ml/min (by C-G formula based on Cr of 1.06).  Recent Labs  10/21/12 1230  VANCOTROUGH 18.9     Microbiology: 8/8 blood x2: ngtd 8/8 foot wound: few MSSA  Anti-infectives: 8/8 >> vanc >> 8/8 >> cipro >> 8/8 8/9 >> Cefepime >>   Assessment: 79 y.o. year old female with significant past medical history of diabetes, CKD, peripheral vascular disease status post left BKA, on IV antibiotics for right lateral malleolar osteomylelitis. Patient is not a surgical candidate and will remain on broad spectrum antibiotics with vancomycin and cefepime for ~6 weeks per ID MD.   Vancomycin trough today in therapeutic range at 18.9 on 750mg  IV q24 hours  Tm/24 h 99.3  WBC remain WNL  SCr at 1.06, baseline 1.3  Patient will be discharged today with home health for IV antibiotic administration   Goal of Therapy:  Vancomycin trough level 15-20 mcg/ml  Plan:  - continue vancomycin 750mg  IV q24h - continue cefepime 1g IV q24h - suggest weekly SCr and vancomycin trough checks while on vancomycin  Thank you for the consult.  Tomi Bamberger, PharmD Clinical Pharmacist Pager: [phone] Pharmacy: [phone] 10/21/2012 1:31 PM

## 2012-10-21 NOTE — Discharge Summary (Signed)
Physician Discharge Summary  Angelica Lawrence ZOX:[id number] DOB: 1927/02/25 DOA: 10/16/2012  PCP: [name], MD  Admit date: 10/16/2012 Discharge date: 10/21/2012  Time spent: 35 minutes  Recommendations for Outpatient Follow-up:  1. Needs daily INR and adjustment of coumadin.  2. Needs IV antibiotics until Sept 19. Needs to follow with ID for further oral antibiotics after that.  3. Needs to follow up with urology for urinary retention. Need voiding trial.   Discharge Diagnoses:    R lateral malleolar osteomyelitis/fracture   Acute on chronic renal failure   Right DVT noted in the common femoral vein and saphenofemoral junction.   Hyponatremia   Anemia   Closed fibular fracture   Decubitus ulcer, ankle   Hyperglycemia   Malnutrition   Protein-calorie malnutrition, severe   Acute on chronic renal failure   Discharge Condition: Stable.   Diet recommendation: Hear healthy.   Filed Weights   10/16/12 2325  Weight: 61.054 kg (134 lb 9.6 oz)    History of present illness:  History of Present Illness:This is a 79 y.o. year old female with significant past medical history of diabetes, CKD, peripheral vascular disease status post left BKA,presenting with right lateral malleolar osteomyelitis. Per family, patient to see her PCP earlier in the week and he noticed a right lateral malleolus ulcer. Affected area seemed to progressively worsen over the course of the week albeit mild. Patient states that patient denies any fevers or chills. Ulcer is been present for an extended period of time however is unaware of how long. no known trauma. baseline type I diabetic. Was taken off of insulin secondary to recurrent hypoglycemia 6 months ago.  patient presents to the ER. Had an x-ray done of the right ankle showing osteomyelitic changes. Orthopedics was counseled Delbert Harness). Preliminary plan is for wound care antibiotics per report.  Pt also had transient episode of CP and SOB on presentation  that self resolved. Had an elevated d dimer in setting of renal failure. CP and SOB self resolved. Satting well on RA with no supplemental O2.    Hospital Course:  1-R lateral malleolar osteomyelitis/fracture: Patient admitted with right malleolar ulcer, diagnosed with osteomyelitis. CAM walker per ortho. Continue with vanc day 6. Ortho recommend medical management. Cipro change to cefepime. Appreciate Dr Daiva Eves help. Ortho spoke with daughter, no surgery planned at this time. Antibiotics until sep 19. She will need to follow up with ID and Dr Eulah Pont.   2-Right: DVT noted in the common femoral vein and saphenofemoral junction. Partial DVT in the popliteal vein. There appears to be superficial thrombus in the proximal greater saphenous vein. Doppler positive for DVT right LE. Continue with Lovenox. VQ scan low probability. Continue with coumadin. Day 6 Lovenox. INR at 2.3. Need Lovenox until 8-13. Need daily INR.   3-Anemia: likely anemia of chronic disease. Continue to follow.  4-Acute on CKD: Baseline Cr 1.2-1.7. Cr on admission at 2.9. Continue with IV fluids. Avoid hypotension. Cr decrease to 1.2 . Hold ACE at discharge.  5-Hypotension : in setting of infection. Respond to IV fluids. Resolved. Resume B blocker and nitrates. Hold for Hypotension.  6-Hyponatremia: Continue with NS IV fluids. Resolved.  7-Urine retention: Patient had 900 urine retention. Foley in place. Will arrange follow up with urology.   Procedures: Doppler Right Lower extremity positive for DVT   Consultations: Dr Eulah Pont Dr Luciana Axe.    Discharge Exam: Filed Vitals:   10/21/12 0616  BP: 120/67  Pulse: 95  Temp: 99.3  F (37.4 C)  Resp: 18    General: No distress.  Cardiovascular: S 1, S 2 RRR Respiratory: CTA  Discharge Instructions  Discharge Orders   Future Appointments Provider Department Dept Phone   11/19/2012 2:00 PM Judyann Munson, MD [facility] [phone]    Future Orders Complete By Expires     Diet - low sodium heart healthy  As directed     Increase activity slowly  As directed         Medication List    STOP taking these medications       aspirin 81 MG tablet     losartan-hydrochlorothiazide 100-25 MG per tablet  Commonly known as:  HYZAAR      TAKE these medications       atenolol 25 MG tablet  Commonly known as:  TENORMIN  Take 1 tablet (25 mg total) by mouth daily.     [employer] IRON 325 (65 FE) MG tablet  Generic drug:  ferrous sulfate  Take 325 mg by mouth daily.     dextrose 5 % SOLN 50 mL with ceFEPIme 1 G SOLR 1 g  Inject 1 g into the vein daily.     enoxaparin 60 MG/0.6ML injection  Commonly known as:  LOVENOX  Inject 0.6 mLs (60 mg total) into the skin every 12 (twelve) hours.     feeding supplement Liqd  Take 237 mLs by mouth 2 (two) times daily between meals.     HYDROcodone-acetaminophen 5-325 MG per tablet  Commonly known as:  NORCO/VICODIN  Take 1 tablet by mouth every 4 (four) hours as needed.     isosorbide mononitrate 10 MG tablet  Commonly known as:  ISMO,MONOKET  Take 10 mg by mouth daily.     LINZESS 145 MCG Caps capsule  Generic drug:  Linaclotide  Take 145 mcg by mouth daily.     omeprazole 20 MG capsule  Commonly known as:  PRILOSEC  Take 1 capsule (20 mg total) by mouth daily.     simvastatin 40 MG tablet  Commonly known as:  ZOCOR  Take 1 tablet (40 mg total) by mouth at bedtime.     traZODone 100 MG tablet  Commonly known as:  DESYREL  Take 1 tablet (100 mg total) by mouth at bedtime.     Vancomycin 750 MG/150ML Soln  Commonly known as:  VANCOCIN  Inject 150 mLs (750 mg total) into the vein daily.     warfarin 2 MG tablet  Commonly known as:  COUMADIN  Take 1 tablet (2 mg total) by mouth daily.       No Known Allergies     Follow-up Information   Follow up with [facility] In 2 weeks.   Contact information:   [address] [place]  [address] [phone]       The results of significant diagnostics from this hospitalization (including imaging, microbiology, ancillary and laboratory) are listed below for reference.    Significant Diagnostic Studies: Dg Chest 1 View  10/16/2012   *RADIOLOGY REPORT*  Clinical Data: Chest pain.  Weakness.  CHEST - 1 VIEW  Comparison: Chest x-ray 05/27/2004.  Findings: Lung volumes are normal.  No consolidative airspace disease.  No pleural effusions.  No pneumothorax.  No pulmonary nodule or mass noted.  Pulmonary vasculature and the cardiomediastinal silhouette are within normal limits. Atherosclerosis in the thoracic aorta.  Status post median sternotomy for CABG.  Post traumatic deformity  of the left humeral head and neck.  IMPRESSION: 1. No radiographic evidence of acute cardiopulmonary disease. 2.  Atherosclerosis.   Original Report Authenticated By: Trudie Reed, M.D.   Dg Ankle Complete Right  10/16/2012   *RADIOLOGY REPORT*  Clinical Data: Diabetic ulcer, lateral malleolus  RIGHT ANKLE - COMPLETE 3+ VIEW  Comparison: None.  Findings: Bones are osteopenic.  Soft tissue defect over the lateral malleolus compatible with ulceration.  There is underlying thinning of the cortex and subcortical bone loss of the lateral malleolus suspicious for early osteomyelitis.  There is also cortical step off on the oblique view compatible with a right distal fibula fracture, nondisplaced.  Distal tibia, talus and calcaneus appear intact.  Grossly normal alignment.  Peripheral vessel calcifications evident.  Right fifth toe amputation also present.  IMPRESSION: Lateral malleolar soft tissue ulceration with underlying cortical thinning and subcortical bone loss of the lateral malleolus with an associated nondisplaced fracture.  Appearance is concerning for osteomyelitis.  Osteopenia  Peripheral atherosclerosis  Previous right fifth toe amputation   Original Report Authenticated By: Judie Petit. Shick, M.D.   Nm Pulmonary  Perf And Vent  10/17/2012   *RADIOLOGY REPORT*  Clinical Data: Elevated D-dimer.  Pulmonary embolism.  NM PULMONARY VENTILATION AND PERFUSION SCAN  Radiopharmaceutical: CURIE MAA TECHNETIUM TO 24M ALBUMIN AGGREGATED 43.9 mCi technetium 99 DTPA .  Comparison: Chest radiograph 10/16/2012.  Findings: The patient was unable to raise arms over head.  This degrades the lateral views on this study.  There are no segmental perfusion defects.  Mildly heterogeneous perfusion is present in the right lung.  The similar changes are present in the left upper lobe on the anterior view.  Radiotracer is present in the trachea on the ventilation study.  No corresponding ventilation defects are identified.  IMPRESSION: Very low probability of pulmonary embolism (0% to 9%).   Original Report Authenticated By: Andreas Newport, M.D.    Microbiology: Recent Results (from the past 240 hour(s))  CULTURE, BLOOD (ROUTINE X 2)     Status: None   Collection Time    10/17/12 12:43 AM      Result Value Range Status   Specimen Description BLOOD RIGHT ANTECUBITAL   Final   Special Requests BOTTLES DRAWN AEROBIC AND ANAEROBIC 5CC   Final   Culture  Setup Time     Final   Value: 10/17/2012 05:01     Performed at [facility]   Culture     Final   Value:        BLOOD CULTURE RECEIVED NO GROWTH TO DATE CULTURE WILL BE HELD FOR 5 DAYS BEFORE ISSUING A FINAL NEGATIVE REPORT     Performed at [facility]   Report Status PENDING   Incomplete  WOUND CULTURE     Status: None   Collection Time    10/17/12 12:51 AM      Result Value Range Status   Specimen Description FOOT   Final   Special Requests NONE   Final   Gram Stain     Final   Value: NO WBC SEEN     NO SQUAMOUS EPITHELIAL CELLS SEEN     RARE GRAM POSITIVE COCCI     IN PAIRS     Performed at [facility]   Culture     Final   Value: FEW STAPHYLOCOCCUS AUREUS     Note: RIFAMPIN AND GENTAMICIN SHOULD NOT BE USED AS SINGLE DRUGS FOR TREATMENT  OF STAPH INFECTIONS.  Performed at [facility]   Report Status 10/20/2012 FINAL   Final   Organism ID, Bacteria STAPHYLOCOCCUS AUREUS   Final  CULTURE, BLOOD (ROUTINE X 2)     Status: None   Collection Time    10/17/12 12:55 AM      Result Value Range Status   Specimen Description BLOOD RIGHT HAND   Final   Special Requests BOTTLES DRAWN AEROBIC AND ANAEROBIC 1CC   Final   Culture  Setup Time     Final   Value: 10/17/2012 05:01     Performed at [facility]   Culture     Final   Value:        BLOOD CULTURE RECEIVED NO GROWTH TO DATE CULTURE WILL BE HELD FOR 5 DAYS BEFORE ISSUING A FINAL NEGATIVE REPORT     Performed at [facility]   Report Status PENDING   Incomplete  MRSA PCR SCREENING     Status: None   Collection Time    10/17/12  2:34 AM      Result Value Range Status   MRSA by PCR NEGATIVE  NEGATIVE Final   Comment:            The GeneXpert MRSA Assay (FDA     approved for NASAL specimens     only), is one component of a     comprehensive MRSA colonization     surveillance program. It is not     intended to diagnose MRSA     infection nor to guide or     monitor treatment for     MRSA infections.     Labs: Basic Metabolic Panel:  Recent Labs Lab 10/16/12 2045 10/17/12 0645 10/18/12 1048 10/19/12 0944 10/20/12 0450 10/21/12 0533  NA 131* 131* 128* 133* 136  --   K 3.9 3.6 3.8 4.1 4.2  --   CL 96 98 96 103 106  --   CO2 21 22 20  18* 22  --   GLUCOSE 207* 190* 151* 146* 147*  --   BUN 47* 46* 40* 34* 27*  --   CREATININE 2.96* 2.75* 2.02* 1.52* 1.27* 1.06  CALCIUM 8.9 8.2* 8.2* 8.3* 8.7  --    Liver Function Tests:  Recent Labs Lab 10/16/12 2045  AST 14  ALT 6  ALKPHOS 69  BILITOT 0.8  PROT 6.4  ALBUMIN 2.5*   No results found for this basename: LIPASE, AMYLASE,  in the last 168 hours No results found for this basename: AMMONIA,  in the last 168 hours CBC:  Recent Labs Lab 10/16/12 2045 10/18/12 1048  10/20/12 0450  WBC 9.2 7.7 6.1  NEUTROABS 6.5  --   --   HGB 11.3* 9.8* 9.5*  HCT 33.3* 29.5* 28.5*  MCV 88.8 89.9 88.8  PLT 203 218 222   Cardiac Enzymes:  Recent Labs Lab 10/16/12 2045 10/17/12 0043 10/17/12 0645 10/17/12 1140  TROPONINI <0.30 <0.30 <0.30 <0.30   BNP: BNP (last 3 results)  Recent Labs  10/16/12 2045  PROBNP 1528.0*   CBG:  Recent Labs Lab 10/20/12 1155 10/20/12 1735 10/20/12 2114 10/21/12 0755 10/21/12 1224  GLUCAP 167* 151* 183* 96 145*       Signed:  Xaden Kaufman  [facility] 10/21/2012, 12:37 PM

## 2012-10-23 ENCOUNTER — Non-Acute Institutional Stay (SKILLED_NURSING_FACILITY): Payer: Medicare Other | Admitting: Internal Medicine

## 2012-10-23 DIAGNOSIS — R269 Unspecified abnormalities of gait and mobility: Secondary | ICD-10-CM

## 2012-10-23 DIAGNOSIS — K219 Gastro-esophageal reflux disease without esophagitis: Secondary | ICD-10-CM

## 2012-10-23 DIAGNOSIS — M86171 Other acute osteomyelitis, right ankle and foot: Secondary | ICD-10-CM

## 2012-10-23 DIAGNOSIS — M86179 Other acute osteomyelitis, unspecified ankle and foot: Secondary | ICD-10-CM

## 2012-10-23 DIAGNOSIS — E785 Hyperlipidemia, unspecified: Secondary | ICD-10-CM

## 2012-10-23 DIAGNOSIS — R531 Weakness: Secondary | ICD-10-CM | POA: Insufficient documentation

## 2012-10-23 DIAGNOSIS — I2510 Atherosclerotic heart disease of native coronary artery without angina pectoris: Secondary | ICD-10-CM

## 2012-10-23 DIAGNOSIS — I82401 Acute embolism and thrombosis of unspecified deep veins of right lower extremity: Secondary | ICD-10-CM

## 2012-10-23 DIAGNOSIS — I82409 Acute embolism and thrombosis of unspecified deep veins of unspecified lower extremity: Secondary | ICD-10-CM | POA: Insufficient documentation

## 2012-10-23 DIAGNOSIS — D509 Iron deficiency anemia, unspecified: Secondary | ICD-10-CM

## 2012-10-23 DIAGNOSIS — R2681 Unsteadiness on feet: Secondary | ICD-10-CM

## 2012-10-23 DIAGNOSIS — R5381 Other malaise: Secondary | ICD-10-CM

## 2012-10-23 DIAGNOSIS — G4700 Insomnia, unspecified: Secondary | ICD-10-CM

## 2012-10-23 LAB — CULTURE, BLOOD (ROUTINE X 2): Culture: NO GROWTH

## 2012-10-23 NOTE — Progress Notes (Signed)
Patient ID: Angelica Lawrence, female   DOB: [birth year], 79 y.o.   MRN: [id number]  Angelica Lawrence living [place]  PCP: Angelica Belling, MD  Code Status: full code  No Known Allergies  Chief Complaint: new admit post hospitalization  HPI:  79 y/o female patient is here for STR post hospital admission 10/16/12- 10/21/12. She has hx of dm, PVD s/p left BKA, CKD and presented to the ED with right lateral malleolar osteomyelitis. Orthopedic was consulted. ID was also consulted. Antibiotics were started. Right popliteal vein dvt was also noted and she was started on lovenox and coumadin. Off lovenox now.  She was seen in her room today. She complaints of pain in her buttock area and her right ankle. She also has been constipated. Denies any other complaints. She has been evaluated by therapy team  Review of Systems  Constitutional: Negative for fever, chills and diaphoresis.  HENT: Negative for hearing loss and sore throat.   Eyes: Negative for blurred vision, double vision and photophobia.  Respiratory: Negative for cough and shortness of breath.   Cardiovascular: Negative for chest pain and palpitations.  Gastrointestinal: Negative for heartburn.  Genitourinary: Negative for dysuria.  Musculoskeletal: Negative for myalgias and falls.  Skin: Negative for itching and rash.  Neurological: Positive for weakness. Negative for dizziness and headaches.  Psychiatric/Behavioral: Negative for depression and memory loss.     Past Medical History  Diagnosis Date  . ALLERGIC RHINITIS 12/30/2006  . ANEMIA, IRON DEFICIENCY 10/21/2009  . COLONIC POLYPS, HX OF 12/30/2006  . CORONARY ARTERY DISEASE 12/30/2006  . DIABETES MELLITUS, TYPE I 12/30/2006  . DIVERTICULOSIS, COLON 12/30/2006  . HYPERLIPIDEMIA 12/30/2006  . HYPERTENSION 12/30/2006  . PERIPHERAL NEUROPATHY 12/30/2006  . PERIPHERAL VASCULAR DISEASE 12/30/2006  . GOITER 12/30/2006  . VITAMIN B12 DEFICIENCY 09/16/2008  . Carpal tunnel syndrome 12/30/2006  .  CATARACTS, BILATERAL 12/30/2006  . HEMORRHOIDS 12/30/2006  . DEGENERATIVE JOINT DISEASE 02/10/2007  . INSOMNIA 10/21/2009  . CORNEAL TRANSPLANT 12/30/2006  . BKA, LEFT LEG 12/30/2006   Past Surgical History  Procedure Laterality Date  . Appendectomy    . Cholecystectomy    . Abdominal hysterectomy  1960's    and bso  . Below knee leg amputation  1989    L   Social History:   reports that she has never smoked. She does not have any smokeless tobacco history on file. She reports that she does not drink alcohol or use illicit drugs.  Family History  Problem Relation Age of Onset  . Cancer Neg Hx   . Heart disease Neg Hx     Medications: Patient's Medications  New Prescriptions   No medications on file  Previous Medications   ATENOLOL (TENORMIN) 25 MG TABLET    Take 1 tablet (25 mg total) by mouth daily.   DEXTROSE 5 % SOLN 50 ML WITH CEFEPIME 1 G SOLR 1 G    Inject 1 g into the vein daily.   FEEDING SUPPLEMENT (ENSURE COMPLETE) LIQD    Take 237 mLs by mouth 2 (two) times daily between meals.   FERROUS SULFATE ([employer] IRON) 325 (65 FE) MG TABLET    Take 325 mg by mouth daily.     HYDROCODONE-ACETAMINOPHEN (NORCO/VICODIN) 5-325 MG PER TABLET    Take 1 tablet by mouth every 4 (four) hours as needed.   ISOSORBIDE MONONITRATE (ISMO,MONOKET) 10 MG TABLET    Take 10 mg by mouth daily.   LINACLOTIDE (LINZESS) 145 MCG CAPS    Take 145 mcg  by mouth daily.   OMEPRAZOLE (PRILOSEC) 20 MG CAPSULE    Take 1 capsule (20 mg total) by mouth daily.   SIMVASTATIN (ZOCOR) 40 MG TABLET    Take 1 tablet (40 mg total) by mouth at bedtime.   TRAZODONE (DESYREL) 100 MG TABLET    Take 1 tablet (100 mg total) by mouth at bedtime.   VANCOMYCIN (VANCOCIN) 750 MG/150ML SOLN    Inject 150 mLs (750 mg total) into the vein daily.   WARFARIN (COUMADIN) 2 MG TABLET    Take 1 tablet (2 mg total) by mouth daily.  Modified Medications   No medications on file  Discontinued Medications   ENOXAPARIN (LOVENOX) 60  MG/0.6ML INJECTION    Inject 0.6 mLs (60 mg total) into the skin every 12 (twelve) hours.     Physical Exam: Filed Vitals:   10/23/12 1339  BP: 138/80  Pulse: 84  Temp: 97.4 F (36.3 C)  Resp: 18  Height: 5\' 6"  (1.676 m)  Weight: 148 lb (67.132 kg)  SpO2: 96%   gen- elderly female, in no acute distress HEENT- no pallor, mmm, no LAD CVS- ns 1,s2, rrr respi- CTAB, no wheeze or crackles abdo- bs+, soft, non tender Ext- right malleolus dressing in place, left BKA, able to move her UE but weakness present Skin- stage 2 in lumbosacral area, dressing clean. Has foley and picc line right arm. Site clean Neuro- aao x 2, no focal deficit Psych- calm this visit   Labs reviewed: Basic Metabolic Panel:  Recent Labs  96/04/54 1048 10/19/12 0944 10/20/12 0450 10/21/12 0533  NA 128* 133* 136  --   K 3.8 4.1 4.2  --   CL 96 103 106  --   CO2 20 18* 22  --   GLUCOSE 151* 146* 147*  --   BUN 40* 34* 27*  --   CREATININE 2.02* 1.52* 1.27* 1.06  CALCIUM 8.2* 8.3* 8.7  --    Liver Function Tests:  Recent Labs  12/14/11 0935 10/16/12 2045  AST 21 14  ALT 12 6  ALKPHOS 76 69  BILITOT 0.4 0.8  PROT 6.3 6.4  ALBUMIN 3.5 2.5*   No results found for this basename: LIPASE, AMYLASE,  in the last 8760 hours No results found for this basename: AMMONIA,  in the last 8760 hours CBC:  Recent Labs  12/14/11 0935 07/04/12 1643 10/16/12 2045 10/18/12 1048 10/20/12 0450  WBC 7.1 7.2 9.2 7.7 6.1  NEUTROABS 4.9 4.9 6.5  --   --   HGB 12.1 11.3* 11.3* 9.8* 9.5*  HCT 37.4 33.8* 33.3* 29.5* 28.5*  MCV 92.3 90.9 88.8 89.9 88.8  PLT 174 174 203 218 222   Cardiac Enzymes:  Recent Labs  10/17/12 0043 10/17/12 0645 10/17/12 1140  TROPONINI <0.30 <0.30 <0.30   BNP: No components found with this basename: POCBNP,  CBG:  Recent Labs  10/20/12 2114 10/21/12 0755 10/21/12 1224  GLUCAP 183* 96 145*    Radiological Exams:  Dg Chest 1 View  10/16/2012   *RADIOLOGY REPORT*   Clinical Data: Chest pain.  Weakness.  CHEST - 1 VIEW  Comparison: Chest x-ray 05/27/2004.  Findings: Lung volumes are normal.  No consolidative airspace disease.  No pleural effusions.  No pneumothorax.  No pulmonary nodule or mass noted.  Pulmonary vasculature and the cardiomediastinal silhouette are within normal limits. Atherosclerosis in the thoracic aorta.  Status post median sternotomy for CABG.  Post traumatic deformity of the left humeral head and neck.  IMPRESSION: 1. No radiographic evidence of acute cardiopulmonary disease. 2.  Atherosclerosis.   Original Report Authenticated By: Trudie Reed, M.D.   Dg Ankle Complete Right  10/16/2012   *RADIOLOGY REPORT*  Clinical Data: Diabetic ulcer, lateral malleolus  RIGHT ANKLE - COMPLETE 3+ VIEW  Comparison: None.  Findings: Bones are osteopenic.  Soft tissue defect over the lateral malleolus compatible with ulceration.  There is underlying thinning of the cortex and subcortical bone loss of the lateral malleolus suspicious for early osteomyelitis.  There is also cortical step off on the oblique view compatible with a right distal fibula fracture, nondisplaced.  Distal tibia, talus and calcaneus appear intact.  Grossly normal alignment.  Peripheral vessel calcifications evident.  Right fifth toe amputation also present.  IMPRESSION: Lateral malleolar soft tissue ulceration with underlying cortical thinning and subcortical bone loss of the lateral malleolus with an associated nondisplaced fracture.  Appearance is concerning for osteomyelitis.  Osteopenia  Peripheral atherosclerosis  Previous right fifth toe amputation   Original Report Authenticated By: Judie Petit. Shick, M.D.   Nm Pulmonary Perf And Vent  10/17/2012   *RADIOLOGY REPORT*  Clinical Data: Elevated D-dimer.  Pulmonary embolism.  NM PULMONARY VENTILATION AND PERFUSION SCAN  Radiopharmaceutical: CURIE MAA TECHNETIUM TO 38M ALBUMIN AGGREGATED 43.9 mCi technetium 99 DTPA .  Comparison: Chest  radiograph 10/16/2012.  Findings: The patient was unable to raise arms over head.  This degrades the lateral views on this study.  There are no segmental perfusion defects.  Mildly heterogeneous perfusion is present in the right lung.  The similar changes are present in the left upper lobe on the anterior view.  Radiotracer is present in the trachea on the ventilation study.  No corresponding ventilation defects are identified.  IMPRESSION: Very low probability of pulmonary embolism (0% to 9%).   Original Report Authenticated By: Andreas Newport, M.D.    Assessment/Plan  Osteomyelitis-Continue iv cefepime and vancomycin until 11/27/12 and has follow up with ID. Adjust vancomycin dose renally. Remains afebrile. Monitor wbc and temp curve. Pain not under control. Will change norco 5-325 to 1 tab every 12 hours and then 1 tab every 6 hr as needed for breakthrough pain and reassess  Gait instability- with her recent deconditioning and right ankle osteomyelitis, will need to work with PT/OT team for gait stability training and muscle exercises for strengthening. Continue ensure supplement. Will adjust her pain meds  Generalized weakness- post infection and hospitalization, has foley catheter and picc line, continue skin care. Has poor nutritional status. Also has pressure ulcer and will provide pressure ulcer prophylaxis. Will order air mattress to prevent further skin breakdown. Encourage po intake. Check prealbumin level. Continue 2 cal supplement for now  Anemia- low hb/hct on discharge from hospital. Check cbc. Continue ferrous sulfate  Urinary retention- continue foley catheter, to see urology for voiding trial  dvt- continue coumadin, goal inr 2-3, fall precautions  GERD- coninue prilosec for now  Hyperlipidemia- continue zocor for now and monitor  Insomnia- continue trazodone for now, monitor   CAD- remains chest pain free. Continue atenolol, isosorbide mononitrate and  statin  Diverticulosis- continue linzess home regimen for now  Labs/tests ordered- cbc, cmp, prealbumin

## 2012-10-23 NOTE — Progress Notes (Signed)
Patient ID: Angelica Lawrence, female   DOB: 1926-12-21, 79 y.o.   MRN: [id number]  Constipation- will add miralax 17 g daily for now and reassess

## 2012-10-28 ENCOUNTER — Encounter: Payer: Self-pay | Admitting: Internal Medicine

## 2012-10-28 NOTE — Progress Notes (Deleted)
Patient ID: Angelica Lawrence, female   DOB: September 21, 1926, 79 y.o.   MRN: [id number] Location:  [facility] Provider:  Gwenith Spitz. Renato Gails, D.O., C.M.D.  Code Status:  ***  Chief Complaint  Patient presents with  . Acute Visit    inadequate pain relief    HPI:  *** Review of Systems:  ROS  Medications: Patient's Medications  New Prescriptions   No medications on file  Previous Medications   ATENOLOL (TENORMIN) 25 MG TABLET    Take 1 tablet (25 mg total) by mouth daily.   DEXTROSE 5 % SOLN 50 ML WITH CEFEPIME 1 G SOLR 1 G    Inject 1 g into the vein daily.   FEEDING SUPPLEMENT (ENSURE COMPLETE) LIQD    Take 237 mLs by mouth 2 (two) times daily between meals.   FERROUS SULFATE ([employer] IRON) 325 (65 FE) MG TABLET    Take 325 mg by mouth daily.     HYDROCODONE-ACETAMINOPHEN (NORCO/VICODIN) 5-325 MG PER TABLET    Take 1 tablet by mouth every 4 (four) hours as needed.   ISOSORBIDE MONONITRATE (ISMO,MONOKET) 10 MG TABLET    Take 10 mg by mouth daily.   LINACLOTIDE (LINZESS) 145 MCG CAPS    Take 145 mcg by mouth daily.   OMEPRAZOLE (PRILOSEC) 20 MG CAPSULE    Take 1 capsule (20 mg total) by mouth daily.   SIMVASTATIN (ZOCOR) 40 MG TABLET    Take 1 tablet (40 mg total) by mouth at bedtime.   TRAZODONE (DESYREL) 100 MG TABLET    Take 1 tablet (100 mg total) by mouth at bedtime.   VANCOMYCIN (VANCOCIN) 750 MG/150ML SOLN    Inject 150 mLs (750 mg total) into the vein daily.   WARFARIN (COUMADIN) 2 MG TABLET    Take 1 tablet (2 mg total) by mouth daily.  Modified Medications   No medications on file  Discontinued Medications   No medications on file    Physical Exam: There were no vitals filed for this visit. Physical Exam   Labs reviewed: Basic Metabolic Panel:  Recent Labs  65/78/46 1048 10/19/12 0944 10/20/12 0450 10/21/12 0533  NA 128* 133* 136  --   K 3.8 4.1 4.2  --   CL 96 103 106  --   CO2 20 18* 22  --   GLUCOSE 151* 146* 147*  --   BUN 40* 34* 27*  --    CREATININE 2.02* 1.52* 1.27* 1.06  CALCIUM 8.2* 8.3* 8.7  --     Liver Function Tests:  Recent Labs  12/14/11 0935 10/16/12 2045  AST 21 14  ALT 12 6  ALKPHOS 76 69  BILITOT 0.4 0.8  PROT 6.3 6.4  ALBUMIN 3.5 2.5*    CBC:  Recent Labs  12/14/11 0935 07/04/12 1643 10/16/12 2045 10/18/12 1048 10/20/12 0450  WBC 7.1 7.2 9.2 7.7 6.1  NEUTROABS 4.9 4.9 6.5  --   --   HGB 12.1 11.3* 11.3* 9.8* 9.5*  HCT 37.4 33.8* 33.3* 29.5* 28.5*  MCV 92.3 90.9 88.8 89.9 88.8  PLT 174 174 203 218 222    Significant Diagnostic Results: ***  Assessment/Plan No problem-specific assessment & plan notes found for this encounter.   Family/ staff Communication: ***  Goals of care: ***  Labs/tests ordered:  ***

## 2012-11-12 ENCOUNTER — Non-Acute Institutional Stay (SKILLED_NURSING_FACILITY): Payer: Medicare Other | Admitting: Adult Health

## 2012-11-12 DIAGNOSIS — I82409 Acute embolism and thrombosis of unspecified deep veins of unspecified lower extremity: Secondary | ICD-10-CM

## 2012-11-13 ENCOUNTER — Ambulatory Visit (INDEPENDENT_AMBULATORY_CARE_PROVIDER_SITE_OTHER): Payer: Medicare Other | Admitting: Internal Medicine

## 2012-11-13 VITALS — BP 97/65 | HR 86 | Temp 98.3°F

## 2012-11-13 DIAGNOSIS — M86171 Other acute osteomyelitis, right ankle and foot: Secondary | ICD-10-CM

## 2012-11-13 DIAGNOSIS — M86179 Other acute osteomyelitis, unspecified ankle and foot: Secondary | ICD-10-CM

## 2012-11-13 NOTE — Progress Notes (Signed)
Patient ID: Angelica Lawrence, female   DOB: 1926/10/13, 79 y.o.   MRN: [id number]         [facility] for Infectious Disease  Patient Active Problem List   Diagnosis Date Noted  . Gait instability 10/23/2012  . Generalized weakness 10/23/2012  . Osteomyelitis of ankle or foot, right, acute 10/23/2012  . DVT (deep venous thrombosis) 10/23/2012  . Anemia 10/17/2012  . Closed fibular fracture 10/17/2012  . Decubitus ulcer, ankle 10/17/2012  . Hyperglycemia 10/17/2012  . Hyponatremia 10/17/2012  . Malnutrition 10/17/2012  . Protein-calorie malnutrition, severe 10/17/2012  . Acute on chronic renal failure 10/17/2012  . Diverticulitis of cecum 09/20/2011  . DM (diabetes mellitus) 09/20/2011  . Dehydration, mild 09/20/2011  . Type I (juvenile type) diabetes mellitus with renal manifestations, not stated as uncontrolled 07/23/2011  . ANEMIA, IRON DEFICIENCY 10/21/2009  . INSOMNIA 10/21/2009  . EDEMA 09/01/2009  . UNSPECIFIED DISORDER OF LIVER 04/12/2009  . VITAMIN B12 DEFICIENCY 09/16/2008  . DEGENERATIVE JOINT DISEASE 02/10/2007  . GOITER 12/30/2006  . HYPERLIPIDEMIA 12/30/2006  . CARPAL TUNNEL SYNDROME 12/30/2006  . PERIPHERAL NEUROPATHY 12/30/2006  . CATARACTS, BILATERAL 12/30/2006  . HYPERTENSION 12/30/2006  . CORONARY ARTERY DISEASE 12/30/2006  . PERIPHERAL VASCULAR DISEASE 12/30/2006  . HEMORRHOIDS 12/30/2006  . ALLERGIC RHINITIS 12/30/2006  . DIVERTICULOSIS, COLON 12/30/2006  . COLONIC POLYPS, HX OF 12/30/2006  . CORNEAL TRANSPLANT 12/30/2006  . BKA, LEFT LEG 12/30/2006    Patient's Medications  New Prescriptions   No medications on file  Previous Medications   ATENOLOL (TENORMIN) 25 MG TABLET    Take 1 tablet (25 mg total) by mouth daily.   DEXTROSE 5 % SOLN 50 ML WITH CEFEPIME 1 G SOLR 1 G    Inject 1 g into the vein daily.   FEEDING SUPPLEMENT (ENSURE COMPLETE) LIQD    Take 237 mLs by mouth 2 (two) times daily between meals.   FERROUS SULFATE ([employer] IRON) 325 (65  FE) MG TABLET    Take 325 mg by mouth daily.     HYDROCODONE-ACETAMINOPHEN (NORCO/VICODIN) 5-325 MG PER TABLET    Take 1 tablet by mouth every 4 (four) hours as needed.   ISOSORBIDE MONONITRATE (ISMO,MONOKET) 10 MG TABLET    Take 10 mg by mouth daily.   LINACLOTIDE (LINZESS) 145 MCG CAPS    Take 145 mcg by mouth daily.   OMEPRAZOLE (PRILOSEC) 20 MG CAPSULE    Take 1 capsule (20 mg total) by mouth daily.   SIMVASTATIN (ZOCOR) 40 MG TABLET    Take 1 tablet (40 mg total) by mouth at bedtime.   TRAZODONE (DESYREL) 100 MG TABLET    Take 1 tablet (100 mg total) by mouth at bedtime.   VANCOMYCIN (VANCOCIN) 750 MG/150ML SOLN    Inject 150 mLs (750 mg total) into the vein daily.   WARFARIN (COUMADIN) 2 MG TABLET    Take 1 tablet (2 mg total) by mouth daily.  Modified Medications   No medications on file  Discontinued Medications   No medications on file    Subjective: Angelica Lawrence is in with her daughter for her hospital followup visit. She is an 79 year old with peripheral vascular disease who is previously undergone a left BKA and several months ago developed an ulcer on her right lateral ankle. She was admitted to the hospital one month ago. She was found to have a distal right fibular fracture and evidence of early osteomyelitis underlying the ulcer. She was seen by Dr. Renaye Rakers (orthopedics)  and measured the ulcer at 3 cm in diameter. She was seen by my partner, Dr. Daiva Eves who felt like the infected ulcer and osteomyelitis were probably not curable with medical therapy alone but she was felt not to be a good candidate for BKA. She was treated with empiric IV vancomycin and cefepime and has now completed 4 weeks of therapy. She's not aware of how her wound is doing. She says she has no pain. She denies having any problems tolerating the PICC or her antibiotics. She has not had any fever or diarrhea. Her daughter has seen the wound on a regular basis over the last several months and says that it is looking  worse. Apparently she was seen by Dr. Eulah Pont in his office recently where an x-ray was obtained. The daughter recalls being told that the fracture was healing.  Review of Systems: Pertinent items are noted in HPI.  Past Medical History  Diagnosis Date  . ALLERGIC RHINITIS 12/30/2006  . ANEMIA, IRON DEFICIENCY 10/21/2009  . COLONIC POLYPS, HX OF 12/30/2006  . CORONARY ARTERY DISEASE 12/30/2006  . DIABETES MELLITUS, TYPE I 12/30/2006  . DIVERTICULOSIS, COLON 12/30/2006  . HYPERLIPIDEMIA 12/30/2006  . HYPERTENSION 12/30/2006  . PERIPHERAL NEUROPATHY 12/30/2006  . PERIPHERAL VASCULAR DISEASE 12/30/2006  . GOITER 12/30/2006  . VITAMIN B12 DEFICIENCY 09/16/2008  . Carpal tunnel syndrome 12/30/2006  . CATARACTS, BILATERAL 12/30/2006  . HEMORRHOIDS 12/30/2006  . DEGENERATIVE JOINT DISEASE 02/10/2007  . INSOMNIA 10/21/2009  . CORNEAL TRANSPLANT 12/30/2006  . BKA, LEFT LEG 12/30/2006    History  Substance Use Topics  . Smoking status: Never Smoker   . Smokeless tobacco: Not on file  . Alcohol Use: No    Family History  Problem Relation Age of Onset  . Cancer Neg Hx   . Heart disease Neg Hx     No Known Allergies  Objective: Temp: 98.3 F (36.8 C) (09/04 1022) Temp src: Oral (09/04 1022) BP: 97/65 mmHg (09/04 1022) Pulse Rate: 86 (09/04 1022)  General: She is in good spirits and in no distress sitting in her wheelchair Skin: Right arm PICC site appears normal Lungs: Clear Cor: Regular S1 and S2 no murmurs She has a circular ulcer over her right lateral malleolus measuring 4-1/2 cm in diameter. There is some serosanguineous drainage on the gauze dressing. There is a shaggy brown exudate covering the entire ulcer.  No results found for this basename: CRP   Lab Results  Component Value Date   ESRSEDRATE 55* 10/16/2012      Assessment: It appears that her nonhealing right ankle ulcer is worsening with wound care and empiric IV antibiotics. I will repeat her inflammatory  markers today and obtain records from Dr. Eulah Pont. We will see her back in 2 weeks to make a decision about continued antibiotic therapy. I discussed the difficulty of healing this type of infection with antibiotics with her daughter.  Plan: 1. Continue IV vancomycin and cefepime for now 2. Check sedimentation rate and C-reactive protein 3. Obtain results of outpatient x-ray done at Dr. Greig Right office 4. Followup in 2 weeks   Cliffton Asters, MD [facility] for Infectious Disease [facility] [phone] pager   [phone] cell 11/13/2012, 11:22 AM

## 2012-11-14 ENCOUNTER — Telehealth: Payer: Self-pay | Admitting: *Deleted

## 2012-11-14 LAB — SEDIMENTATION RATE: Sed Rate: 45 mm/h — ABNORMAL HIGH (ref 0–22)

## 2012-11-14 NOTE — Telephone Encounter (Signed)
Message left for medical records to fax notes to [facility] for Dr. Orvan Falconer.

## 2012-11-19 ENCOUNTER — Inpatient Hospital Stay: Payer: Medicare Other | Admitting: Internal Medicine

## 2012-12-02 ENCOUNTER — Ambulatory Visit (INDEPENDENT_AMBULATORY_CARE_PROVIDER_SITE_OTHER): Payer: Medicare Other | Admitting: Internal Medicine

## 2012-12-02 ENCOUNTER — Encounter: Payer: Self-pay | Admitting: Internal Medicine

## 2012-12-02 VITALS — BP 110/71 | HR 88 | Temp 98.1°F

## 2012-12-02 DIAGNOSIS — M86179 Other acute osteomyelitis, unspecified ankle and foot: Secondary | ICD-10-CM

## 2012-12-02 DIAGNOSIS — Z23 Encounter for immunization: Secondary | ICD-10-CM

## 2012-12-02 DIAGNOSIS — M86171 Other acute osteomyelitis, right ankle and foot: Secondary | ICD-10-CM

## 2012-12-02 NOTE — Progress Notes (Signed)
Patient ID: Angelica Lawrence, female   DOB: December 29, 1926, 79 y.o.   MRN: [id number]         [facility] for Infectious Disease  Patient Active Problem List   Diagnosis Date Noted  . Gait instability 10/23/2012  . Generalized weakness 10/23/2012  . Osteomyelitis of ankle or foot, right, acute 10/23/2012  . DVT (deep venous thrombosis) 10/23/2012  . Anemia 10/17/2012  . Closed fibular fracture 10/17/2012  . Decubitus ulcer, ankle 10/17/2012  . Hyperglycemia 10/17/2012  . Hyponatremia 10/17/2012  . Malnutrition 10/17/2012  . Protein-calorie malnutrition, severe 10/17/2012  . Acute on chronic renal failure 10/17/2012  . Diverticulitis of cecum 09/20/2011  . DM (diabetes mellitus) 09/20/2011  . Dehydration, mild 09/20/2011  . Type I (juvenile type) diabetes mellitus with renal manifestations, not stated as uncontrolled 07/23/2011  . ANEMIA, IRON DEFICIENCY 10/21/2009  . INSOMNIA 10/21/2009  . EDEMA 09/01/2009  . UNSPECIFIED DISORDER OF LIVER 04/12/2009  . VITAMIN B12 DEFICIENCY 09/16/2008  . DEGENERATIVE JOINT DISEASE 02/10/2007  . GOITER 12/30/2006  . HYPERLIPIDEMIA 12/30/2006  . CARPAL TUNNEL SYNDROME 12/30/2006  . PERIPHERAL NEUROPATHY 12/30/2006  . CATARACTS, BILATERAL 12/30/2006  . HYPERTENSION 12/30/2006  . CORONARY ARTERY DISEASE 12/30/2006  . PERIPHERAL VASCULAR DISEASE 12/30/2006  . HEMORRHOIDS 12/30/2006  . ALLERGIC RHINITIS 12/30/2006  . DIVERTICULOSIS, COLON 12/30/2006  . COLONIC POLYPS, HX OF 12/30/2006  . CORNEAL TRANSPLANT 12/30/2006  . BKA, LEFT LEG 12/30/2006    Patient's Medications  New Prescriptions   No medications on file  Previous Medications   ATENOLOL (TENORMIN) 25 MG TABLET    Take 1 tablet (25 mg total) by mouth daily.   FEEDING SUPPLEMENT (ENSURE COMPLETE) LIQD    Take 237 mLs by mouth 2 (two) times daily between meals.   FERROUS SULFATE ([employer] IRON) 325 (65 FE) MG TABLET    Take 325 mg by mouth daily.     HYDROCODONE-ACETAMINOPHEN  (NORCO/VICODIN) 5-325 MG PER TABLET    Take 1 tablet by mouth every 4 (four) hours as needed.   ISOSORBIDE MONONITRATE (ISMO,MONOKET) 10 MG TABLET    Take 10 mg by mouth daily.   LINACLOTIDE (LINZESS) 145 MCG CAPS    Take 145 mcg by mouth daily.   OMEPRAZOLE (PRILOSEC) 20 MG CAPSULE    Take 1 capsule (20 mg total) by mouth daily.   SIMVASTATIN (ZOCOR) 40 MG TABLET    Take 1 tablet (40 mg total) by mouth at bedtime.   TRAZODONE (DESYREL) 100 MG TABLET    Take 1 tablet (100 mg total) by mouth at bedtime.   WARFARIN (COUMADIN) 2 MG TABLET    Take 1 tablet (2 mg total) by mouth daily.  Modified Medications   No medications on file  Discontinued Medications   DEXTROSE 5 % SOLN 50 ML WITH CEFEPIME 1 G SOLR 1 G    Inject 1 g into the vein daily.   VANCOMYCIN (VANCOCIN) 750 MG/150ML SOLN    Inject 150 mLs (750 mg total) into the vein daily.    Subjective: Angelica Lawrence is in for her followup visit. She is now completed nearly 7 weeks of IV vancomycin and cefepime as empiric therapy for right ankle osteomyelitis. She's had no problems tolerating her PICC or the antibiotics. Yesterday she began having some right lower leg pain. She states that she thought that was a good sign because she had not been having any pain at all related to her chronic right ankle ulcer. She has not had any fever, chills  or sweats that she knows of. Review of Systems: Pertinent items are noted in HPI.  Past Medical History  Diagnosis Date  . ALLERGIC RHINITIS 12/30/2006  . ANEMIA, IRON DEFICIENCY 10/21/2009  . COLONIC POLYPS, HX OF 12/30/2006  . CORONARY ARTERY DISEASE 12/30/2006  . DIABETES MELLITUS, TYPE I 12/30/2006  . DIVERTICULOSIS, COLON 12/30/2006  . HYPERLIPIDEMIA 12/30/2006  . HYPERTENSION 12/30/2006  . PERIPHERAL NEUROPATHY 12/30/2006  . PERIPHERAL VASCULAR DISEASE 12/30/2006  . GOITER 12/30/2006  . VITAMIN B12 DEFICIENCY 09/16/2008  . Carpal tunnel syndrome 12/30/2006  . CATARACTS, BILATERAL 12/30/2006  .  HEMORRHOIDS 12/30/2006  . DEGENERATIVE JOINT DISEASE 02/10/2007  . INSOMNIA 10/21/2009  . CORNEAL TRANSPLANT 12/30/2006  . BKA, LEFT LEG 12/30/2006    History  Substance Use Topics  . Smoking status: Never Smoker   . Smokeless tobacco: Not on file  . Alcohol Use: No    Family History  Problem Relation Age of Onset  . Cancer Neg Hx   . Heart disease Neg Hx     No Known Allergies  Objective: Temp: 98.1 F (36.7 C) (09/23 1035) Temp src: Oral (09/23 1035) BP: 110/71 mmHg (09/23 1035) Pulse Rate: 88 (09/23 1035)  General: She is in good spirits seated in a wheelchair Skin: Right arm PICC site appears normal Right ankle: She continues to have a large ulcer over the right lateral malleolus. It measures 4.5 cm in diameter which is unchanged from her last visit here. There shaggy yellow tissue covering approximately 80% of the base of the ulcer. There is some serous drainage on gauze dressing without any odor.  Lab Results  Component Value Date   CRP 1.6* 11/13/2012   Lab Results  Component Value Date   ESRSEDRATE 45* 11/13/2012    A note from Dr. Margarita Rana dated September 8 commented that her three-view ankle films demonstrated slow healing of the lateral malleolus fracture  Assessment: She has received nearly 7 weeks of empiric antibiotic therapy for right ankle osteomyelitis. Unfortunately, although the fracture site is healing she has been unable to heal her chronic ulcer. I will stop her antibiotics today and have the PICC removed. She has been referred to Dr. Aldean Baker for further evaluation of the ulcer.  Plan: 1. Repeat sedimentation rate and C-reactive protein 2. Discontinue vancomycin and cefepime and removed PICC 3. Influenza vaccination provided today 4. Followup here as needed   Cliffton Asters, MD [facility] for Infectious Disease [facility] [phone] pager   [phone] cell 12/02/2012, 10:59 AM

## 2012-12-02 NOTE — Progress Notes (Signed)
RN received verbal order to discontinue the patient's PICC line.  Patient identified with name and date of birth. PICC dressing removed, site unremarkable.  PICC line removed using sterile procedure @ 1130. PICC length equal to that noted in patient's hospital chart of 40 cm. Sterile petroleum gauze + sterile 4X4 applied to PICC site, pressure applied for 10 minutes and covered with Medipore tape as a pressure dressing. Patient tolerated procedure without complaints.  Patient instructed to limit use of arm for 1 hour. Patient instructed that the pressure dressing should remain in place for 24 hours. Patient verbalized understanding of these instructions.

## 2012-12-03 ENCOUNTER — Non-Acute Institutional Stay (SKILLED_NURSING_FACILITY): Payer: Medicare Other | Admitting: Adult Health

## 2012-12-03 DIAGNOSIS — I10 Essential (primary) hypertension: Secondary | ICD-10-CM

## 2012-12-03 DIAGNOSIS — I82401 Acute embolism and thrombosis of unspecified deep veins of right lower extremity: Secondary | ICD-10-CM

## 2012-12-03 DIAGNOSIS — E785 Hyperlipidemia, unspecified: Secondary | ICD-10-CM

## 2012-12-03 DIAGNOSIS — I2510 Atherosclerotic heart disease of native coronary artery without angina pectoris: Secondary | ICD-10-CM

## 2012-12-03 DIAGNOSIS — G4700 Insomnia, unspecified: Secondary | ICD-10-CM

## 2012-12-03 DIAGNOSIS — I82409 Acute embolism and thrombosis of unspecified deep veins of unspecified lower extremity: Secondary | ICD-10-CM

## 2012-12-03 DIAGNOSIS — D509 Iron deficiency anemia, unspecified: Secondary | ICD-10-CM

## 2012-12-09 ENCOUNTER — Ambulatory Visit (HOSPITAL_COMMUNITY): Admission: RE | Admit: 2012-12-09 | Payer: Medicare Other | Source: Ambulatory Visit

## 2012-12-09 ENCOUNTER — Other Ambulatory Visit (HOSPITAL_COMMUNITY): Payer: Self-pay | Admitting: Orthopedic Surgery

## 2012-12-09 NOTE — Progress Notes (Addendum)
I spoke with Alona Bene, patients nurse at [facility] and gave instructions for Npo after midnght.  AM medication wirh a sip of water Isorbide an Omeprazole; Vicodin if needed.  Coumadin has not been stopped, i instructed to not take tonight.  Pt is playing bingo at the current time, I was instructed to call her back after 6pm.

## 2012-12-10 ENCOUNTER — Encounter (HOSPITAL_COMMUNITY): Payer: Self-pay | Admitting: *Deleted

## 2012-12-10 ENCOUNTER — Ambulatory Visit (HOSPITAL_COMMUNITY): Payer: Medicare Other | Admitting: Anesthesiology

## 2012-12-10 ENCOUNTER — Inpatient Hospital Stay (HOSPITAL_COMMUNITY)
Admission: RE | Admit: 2012-12-10 | Discharge: 2012-12-12 | DRG: 617 | Disposition: A | Discharge status: Skilled Nursing Facility | Payer: Medicare Other | Source: Ambulatory Visit | Attending: Orthopedic Surgery | Admitting: Orthopedic Surgery

## 2012-12-10 ENCOUNTER — Encounter (HOSPITAL_COMMUNITY)
Admission: RE | Disposition: A | Discharge status: Skilled Nursing Facility | Payer: Self-pay | Source: Ambulatory Visit | Attending: Orthopedic Surgery

## 2012-12-10 ENCOUNTER — Encounter (HOSPITAL_COMMUNITY): Payer: Self-pay | Admitting: Anesthesiology

## 2012-12-10 ENCOUNTER — Ambulatory Visit (HOSPITAL_COMMUNITY): Payer: Medicare Other

## 2012-12-10 DIAGNOSIS — Z951 Presence of aortocoronary bypass graft: Secondary | ICD-10-CM

## 2012-12-10 DIAGNOSIS — I10 Essential (primary) hypertension: Secondary | ICD-10-CM | POA: Diagnosis present

## 2012-12-10 DIAGNOSIS — E1069 Type 1 diabetes mellitus with other specified complication: Principal | ICD-10-CM | POA: Diagnosis present

## 2012-12-10 DIAGNOSIS — E1142 Type 2 diabetes mellitus with diabetic polyneuropathy: Secondary | ICD-10-CM | POA: Diagnosis present

## 2012-12-10 DIAGNOSIS — M9080 Osteopathy in diseases classified elsewhere, unspecified site: Secondary | ICD-10-CM | POA: Diagnosis present

## 2012-12-10 DIAGNOSIS — M869 Osteomyelitis, unspecified: Secondary | ICD-10-CM | POA: Diagnosis present

## 2012-12-10 DIAGNOSIS — S88119A Complete traumatic amputation at level between knee and ankle, unspecified lower leg, initial encounter: Secondary | ICD-10-CM

## 2012-12-10 DIAGNOSIS — I739 Peripheral vascular disease, unspecified: Secondary | ICD-10-CM | POA: Diagnosis present

## 2012-12-10 DIAGNOSIS — M86171 Other acute osteomyelitis, right ankle and foot: Secondary | ICD-10-CM

## 2012-12-10 DIAGNOSIS — E785 Hyperlipidemia, unspecified: Secondary | ICD-10-CM | POA: Diagnosis present

## 2012-12-10 DIAGNOSIS — L97309 Non-pressure chronic ulcer of unspecified ankle with unspecified severity: Secondary | ICD-10-CM | POA: Diagnosis present

## 2012-12-10 DIAGNOSIS — M1990 Unspecified osteoarthritis, unspecified site: Secondary | ICD-10-CM | POA: Diagnosis present

## 2012-12-10 DIAGNOSIS — I2510 Atherosclerotic heart disease of native coronary artery without angina pectoris: Secondary | ICD-10-CM | POA: Diagnosis present

## 2012-12-10 DIAGNOSIS — E1049 Type 1 diabetes mellitus with other diabetic neurological complication: Secondary | ICD-10-CM | POA: Diagnosis present

## 2012-12-10 DIAGNOSIS — G609 Hereditary and idiopathic neuropathy, unspecified: Secondary | ICD-10-CM | POA: Diagnosis present

## 2012-12-10 HISTORY — DX: Acute embolism and thrombosis of unspecified deep veins of unspecified lower extremity: I82.409

## 2012-12-10 HISTORY — DX: Cerebral infarction, unspecified: I63.9

## 2012-12-10 HISTORY — PX: AMPUTATION: SHX166

## 2012-12-10 HISTORY — DX: Type 2 diabetes mellitus without complications: E11.9

## 2012-12-10 HISTORY — PX: BELOW KNEE LEG AMPUTATION: SUR23

## 2012-12-10 LAB — CBC
Hemoglobin: 10.1 g/dL — ABNORMAL LOW (ref 12.0–15.0)
MCH: 31.8 pg (ref 26.0–34.0)
Platelets: 233 10*3/uL (ref 150–400)
RBC: 3.18 MIL/uL — ABNORMAL LOW (ref 3.87–5.11)
RDW: 15.4 % (ref 11.5–15.5)

## 2012-12-10 LAB — PROTIME-INR
INR: 2.02 — ABNORMAL HIGH (ref 0.00–1.49)
Prothrombin Time: 22.2 seconds — ABNORMAL HIGH (ref 11.6–15.2)

## 2012-12-10 LAB — BASIC METABOLIC PANEL
Calcium: 8.5 mg/dL (ref 8.4–10.5)
GFR calc Af Amer: 68 mL/min — ABNORMAL LOW (ref >90)
GFR calc non Af Amer: 59 mL/min — ABNORMAL LOW (ref >90)
Glucose, Bld: 83 mg/dL (ref 70–99)
Potassium: 4.9 mEq/L (ref 3.5–5.1)
Sodium: 129 mEq/L — ABNORMAL LOW (ref 135–145)

## 2012-12-10 LAB — GLUCOSE, CAPILLARY
Glucose-Capillary: 115 mg/dL — ABNORMAL HIGH (ref 70–99)
Glucose-Capillary: 70 mg/dL (ref 70–99)
Glucose-Capillary: 94 mg/dL (ref 70–99)

## 2012-12-10 LAB — SURGICAL PCR SCREEN
MRSA, PCR: NEGATIVE
Staphylococcus aureus: NEGATIVE

## 2012-12-10 LAB — APTT: aPTT: 33 seconds (ref 24–37)

## 2012-12-10 SURGERY — AMPUTATION BELOW KNEE
Anesthesia: General | Site: Leg Lower | Laterality: Right | Wound class: Dirty or Infected

## 2012-12-10 MED ORDER — ENSURE COMPLETE PO LIQD
237.0000 mL | Freq: Two times a day (BID) | ORAL | Status: DC
  Administered 2012-12-11 (×2): 237 mL via ORAL

## 2012-12-10 MED ORDER — HYDROMORPHONE HCL PF 1 MG/ML IJ SOLN
0.5000 mg | INTRAMUSCULAR | Status: DC | PRN
  Administered 2012-12-10: 0.5 mg via INTRAVENOUS
  Administered 2012-12-11: 1 mg via INTRAVENOUS
  Administered 2012-12-11: 0.5 mg via INTRAVENOUS
  Filled 2012-12-10 (×3): qty 1

## 2012-12-10 MED ORDER — METOCLOPRAMIDE HCL 10 MG PO TABS
5.0000 mg | ORAL_TABLET | Freq: Three times a day (TID) | ORAL | Status: DC | PRN
  Administered 2012-12-12: 10 mg via ORAL
  Filled 2012-12-10: qty 1

## 2012-12-10 MED ORDER — PROPOFOL 10 MG/ML IV BOLUS
INTRAVENOUS | Status: DC | PRN
  Administered 2012-12-10: 100 mg via INTRAVENOUS

## 2012-12-10 MED ORDER — ONDANSETRON HCL 4 MG/2ML IJ SOLN
4.0000 mg | Freq: Four times a day (QID) | INTRAMUSCULAR | Status: DC | PRN
  Administered 2012-12-10 – 2012-12-12 (×3): 4 mg via INTRAVENOUS
  Filled 2012-12-10 (×2): qty 2

## 2012-12-10 MED ORDER — HYDROMORPHONE HCL PF 1 MG/ML IJ SOLN
INTRAMUSCULAR | Status: AC
  Filled 2012-12-10: qty 1

## 2012-12-10 MED ORDER — ONDANSETRON HCL 4 MG PO TABS: 4.0000 mg | ORAL_TABLET | Freq: Four times a day (QID) | ORAL | Status: DC | PRN

## 2012-12-10 MED ORDER — CEFAZOLIN SODIUM 1-5 GM-% IV SOLN
1.0000 g | Freq: Four times a day (QID) | INTRAVENOUS | Status: AC
  Administered 2012-12-10 – 2012-12-11 (×3): 1 g via INTRAVENOUS
  Filled 2012-12-10 (×3): qty 50

## 2012-12-10 MED ORDER — DEXTROSE 50 % IV SOLN
25.0000 mL | Freq: Once | INTRAVENOUS | Status: AC
  Administered 2012-12-10: 25 mL via INTRAVENOUS

## 2012-12-10 MED ORDER — LACTATED RINGERS IV SOLN
INTRAVENOUS | Status: DC | PRN
  Administered 2012-12-10: 12:00:00 via INTRAVENOUS

## 2012-12-10 MED ORDER — FERROUS SULFATE 325 (65 FE) MG PO TABS
325.0000 mg | ORAL_TABLET | Freq: Every day | ORAL | Status: DC
  Administered 2012-12-11 – 2012-12-12 (×2): 325 mg via ORAL
  Filled 2012-12-10 (×3): qty 1

## 2012-12-10 MED ORDER — SODIUM CHLORIDE 0.9 % IV SOLN
INTRAVENOUS | Status: DC
  Administered 2012-12-11: 14:00:00 via INTRAVENOUS

## 2012-12-10 MED ORDER — HYDROMORPHONE HCL PF 1 MG/ML IJ SOLN
INTRAMUSCULAR | Status: AC
  Administered 2012-12-10: 0.5 mg via INTRAVENOUS
  Filled 2012-12-10: qty 1

## 2012-12-10 MED ORDER — ONDANSETRON HCL 4 MG/2ML IJ SOLN
INTRAMUSCULAR | Status: AC
  Filled 2012-12-10: qty 2

## 2012-12-10 MED ORDER — LINACLOTIDE 145 MCG PO CAPS
145.0000 ug | ORAL_CAPSULE | Freq: Every day | ORAL | Status: DC
  Administered 2012-12-11: 145 ug via ORAL
  Filled 2012-12-10 (×2): qty 1

## 2012-12-10 MED ORDER — SIMVASTATIN 40 MG PO TABS
40.0000 mg | ORAL_TABLET | Freq: Every day | ORAL | Status: DC
  Administered 2012-12-10 – 2012-12-11 (×2): 40 mg via ORAL
  Filled 2012-12-10 (×3): qty 1

## 2012-12-10 MED ORDER — WARFARIN SODIUM 2 MG PO TABS
2.0000 mg | ORAL_TABLET | Freq: Every day | ORAL | Status: DC
  Administered 2012-12-10 – 2012-12-11 (×2): 2 mg via ORAL
  Filled 2012-12-10 (×3): qty 1

## 2012-12-10 MED ORDER — MUPIROCIN 2 % EX OINT
TOPICAL_OINTMENT | Freq: Two times a day (BID) | CUTANEOUS | Status: DC
  Administered 2012-12-10: 11:00:00 via NASAL
  Filled 2012-12-10: qty 22

## 2012-12-10 MED ORDER — TRAZODONE HCL 100 MG PO TABS
100.0000 mg | ORAL_TABLET | Freq: Every day | ORAL | Status: DC
  Administered 2012-12-10 – 2012-12-11 (×2): 100 mg via ORAL
  Filled 2012-12-10 (×3): qty 1

## 2012-12-10 MED ORDER — PANTOPRAZOLE SODIUM 40 MG PO TBEC
40.0000 mg | DELAYED_RELEASE_TABLET | Freq: Every day | ORAL | Status: DC
  Administered 2012-12-11: 40 mg via ORAL
  Filled 2012-12-10: qty 1

## 2012-12-10 MED ORDER — WARFARIN - PHARMACIST DOSING INPATIENT: Freq: Every day | Status: DC

## 2012-12-10 MED ORDER — 0.9 % SODIUM CHLORIDE (POUR BTL) OPTIME
TOPICAL | Status: DC | PRN
  Administered 2012-12-10: 1000 mL

## 2012-12-10 MED ORDER — ATENOLOL 25 MG PO TABS
25.0000 mg | ORAL_TABLET | Freq: Every day | ORAL | Status: DC
  Filled 2012-12-10 (×2): qty 1

## 2012-12-10 MED ORDER — LACTATED RINGERS IV SOLN
INTRAVENOUS | Status: DC
  Administered 2012-12-10: 11:00:00 via INTRAVENOUS

## 2012-12-10 MED ORDER — FENTANYL CITRATE 0.05 MG/ML IJ SOLN
INTRAMUSCULAR | Status: DC | PRN
  Administered 2012-12-10: 50 ug via INTRAVENOUS

## 2012-12-10 MED ORDER — METOCLOPRAMIDE HCL 5 MG/ML IJ SOLN: 5.0000 mg | Freq: Three times a day (TID) | INTRAMUSCULAR | Status: DC | PRN

## 2012-12-10 MED ORDER — CEFAZOLIN SODIUM-DEXTROSE 2-3 GM-% IV SOLR
INTRAVENOUS | Status: AC
  Administered 2012-12-10: 2 g via INTRAVENOUS
  Filled 2012-12-10: qty 50

## 2012-12-10 MED ORDER — OXYCODONE-ACETAMINOPHEN 5-325 MG PO TABS
1.0000 | ORAL_TABLET | ORAL | Status: DC | PRN
  Administered 2012-12-11 – 2012-12-12 (×3): 1 via ORAL
  Filled 2012-12-10 (×4): qty 1

## 2012-12-10 MED ORDER — MUPIROCIN 2 % EX OINT
TOPICAL_OINTMENT | CUTANEOUS | Status: AC
  Filled 2012-12-10: qty 22

## 2012-12-10 MED ORDER — DEXTROSE 50 % IV SOLN
INTRAVENOUS | Status: AC
  Filled 2012-12-10: qty 50

## 2012-12-10 MED ORDER — CEFAZOLIN SODIUM-DEXTROSE 2-3 GM-% IV SOLR: 2.0000 g | INTRAVENOUS | Status: DC

## 2012-12-10 MED ORDER — ISOSORBIDE MONONITRATE 10 MG PO TABS
10.0000 mg | ORAL_TABLET | Freq: Every day | ORAL | Status: DC
  Filled 2012-12-10 (×2): qty 1

## 2012-12-10 MED ORDER — HYDROMORPHONE HCL PF 1 MG/ML IJ SOLN
0.2500 mg | INTRAMUSCULAR | Status: DC | PRN
  Administered 2012-12-10 (×3): 0.5 mg via INTRAVENOUS

## 2012-12-10 MED ORDER — LIDOCAINE HCL (CARDIAC) 20 MG/ML IV SOLN
INTRAVENOUS | Status: DC | PRN
  Administered 2012-12-10: 50 mg via INTRAVENOUS

## 2012-12-10 MED ORDER — HYDROCODONE-ACETAMINOPHEN 5-325 MG PO TABS
1.0000 | ORAL_TABLET | ORAL | Status: DC | PRN
  Administered 2012-12-11: 1 via ORAL
  Filled 2012-12-10: qty 1

## 2012-12-10 SURGICAL SUPPLY — 45 items
BANDAGE ESMARK 6X9 LF (GAUZE/BANDAGES/DRESSINGS) ×1 IMPLANT
BANDAGE GAUZE ELAST BULKY 4 IN (GAUZE/BANDAGES/DRESSINGS) ×4 IMPLANT
BLADE SAW RECIP 87.9 MT (BLADE) ×2 IMPLANT
BLADE SURG 21 STRL SS (BLADE) ×2 IMPLANT
BNDG CMPR 9X6 STRL LF SNTH (GAUZE/BANDAGES/DRESSINGS)
BNDG COHESIVE 6X5 TAN STRL LF (GAUZE/BANDAGES/DRESSINGS) ×4 IMPLANT
BNDG ESMARK 6X9 LF (GAUZE/BANDAGES/DRESSINGS)
CLOTH BEACON ORANGE TIMEOUT ST (SAFETY) ×2 IMPLANT
COVER SURGICAL LIGHT HANDLE (MISCELLANEOUS) ×2 IMPLANT
CUFF TOURNIQUET SINGLE 34IN LL (TOURNIQUET CUFF) ×1 IMPLANT
CUFF TOURNIQUET SINGLE 44IN (TOURNIQUET CUFF) IMPLANT
DRAIN PENROSE 1/2X12 LTX STRL (WOUND CARE) IMPLANT
DRAPE EXTREMITY T 121X128X90 (DRAPE) ×2 IMPLANT
DRAPE PROXIMA HALF (DRAPES) ×4 IMPLANT
DRAPE U-SHAPE 47X51 STRL (DRAPES) ×4 IMPLANT
DRSG ADAPTIC 3X8 NADH LF (GAUZE/BANDAGES/DRESSINGS) ×2 IMPLANT
DRSG PAD ABDOMINAL 8X10 ST (GAUZE/BANDAGES/DRESSINGS) ×2 IMPLANT
DURAPREP 26ML APPLICATOR (WOUND CARE) ×2 IMPLANT
ELECT REM PT RETURN 9FT ADLT (ELECTROSURGICAL) ×2
ELECTRODE REM PT RTRN 9FT ADLT (ELECTROSURGICAL) ×1 IMPLANT
EVACUATOR 1/8 PVC DRAIN (DRAIN) IMPLANT
GLOVE BIOGEL PI IND STRL 9 (GLOVE) ×1 IMPLANT
GLOVE BIOGEL PI INDICATOR 9 (GLOVE) ×1
GLOVE SURG ORTHO 9.0 STRL STRW (GLOVE) ×2 IMPLANT
GOWN PREVENTION PLUS XLARGE (GOWN DISPOSABLE) ×2 IMPLANT
GOWN SRG XL XLNG 56XLVL 4 (GOWN DISPOSABLE) ×1 IMPLANT
GOWN STRL NON-REIN XL XLG LVL4 (GOWN DISPOSABLE) ×2
KIT BASIN OR (CUSTOM PROCEDURE TRAY) ×2 IMPLANT
KIT ROOM TURNOVER OR (KITS) ×2 IMPLANT
MANIFOLD NEPTUNE II (INSTRUMENTS) ×2 IMPLANT
NS IRRIG 1000ML POUR BTL (IV SOLUTION) ×2 IMPLANT
PACK GENERAL/GYN (CUSTOM PROCEDURE TRAY) ×2 IMPLANT
PAD ARMBOARD 7.5X6 YLW CONV (MISCELLANEOUS) ×4 IMPLANT
SPONGE GAUZE 4X4 12PLY (GAUZE/BANDAGES/DRESSINGS) ×2 IMPLANT
SPONGE LAP 18X18 X RAY DECT (DISPOSABLE) IMPLANT
STAPLER VISISTAT 35W (STAPLE) ×1 IMPLANT
STOCKINETTE IMPERVIOUS LG (DRAPES) ×2 IMPLANT
SUT PDS AB 1 CT  36 (SUTURE) ×2
SUT PDS AB 1 CT 36 (SUTURE) IMPLANT
SUT SILK 2 0 (SUTURE) ×2
SUT SILK 2-0 18XBRD TIE 12 (SUTURE) ×1 IMPLANT
TOWEL OR 17X24 6PK STRL BLUE (TOWEL DISPOSABLE) ×2 IMPLANT
TOWEL OR 17X26 10 PK STRL BLUE (TOWEL DISPOSABLE) ×2 IMPLANT
TUBE ANAEROBIC SPECIMEN COL (MISCELLANEOUS) IMPLANT
WATER STERILE IRR 1000ML POUR (IV SOLUTION) ×2 IMPLANT

## 2012-12-10 NOTE — Anesthesia Procedure Notes (Signed)
Procedure Name: LMA Insertion Date/Time: 12/10/2012 12:48 PM Performed by: Marena Chancy Pre-anesthesia Checklist: Patient identified, Patient being monitored, Emergency Drugs available, Timeout performed and Suction available Patient Re-evaluated:Patient Re-evaluated prior to inductionOxygen Delivery Method: Circle system utilized Preoxygenation: Pre-oxygenation with 100% oxygen Intubation Type: IV induction Ventilation: Mask ventilation without difficulty LMA: LMA inserted LMA Size: 4.0 Number of attempts: 1 Placement Confirmation: positive ETCO2 and breath sounds checked- equal and bilateral Tube secured with: Tape Dental Injury: Teeth and Oropharynx as per pre-operative assessment

## 2012-12-10 NOTE — Transfer of Care (Signed)
Immediate Anesthesia Transfer of Care Note  Patient: Angelica Lawrence  Procedure(s) Performed: Procedure(s): AMPUTATION BELOW RIGHT KNEE (Right)  Patient Location: PACU  Anesthesia Type:General  Level of Consciousness: awake, alert  and oriented  Airway & Oxygen Therapy: Patient Spontanous Breathing  Post-op Assessment: Report given to PACU RN and Post -op Vital signs reviewed and stable  Post vital signs: Reviewed and stable  Complications: No apparent anesthesia complications

## 2012-12-10 NOTE — Preoperative (Signed)
Beta Blockers   Reason not to administer Beta Blockers:tenormin taken today

## 2012-12-10 NOTE — Progress Notes (Signed)
ANTICOAGULATION CONSULT NOTE - Initial Consult  Pharmacy Consult for Coumadin Indication: VTE prophylaxis  No Known Allergies  Labs:  Recent Labs  12/10/12 1008  HGB 10.1*  HCT 28.5*  PLT 233  APTT 33  LABPROT 22.2*  INR 2.02*  CREATININE 0.88    The CrCl is unknown because both a height and weight (above a minimum accepted value) are required for this calculation.   Medical History: Past Medical History  Diagnosis Date  . ALLERGIC RHINITIS 12/30/2006  . ANEMIA, IRON DEFICIENCY 10/21/2009  . COLONIC POLYPS, HX OF 12/30/2006  . CORONARY ARTERY DISEASE 12/30/2006  . DIABETES MELLITUS, TYPE I 12/30/2006  . DIVERTICULOSIS, COLON 12/30/2006  . HYPERLIPIDEMIA 12/30/2006  . HYPERTENSION 12/30/2006  . PERIPHERAL NEUROPATHY 12/30/2006  . PERIPHERAL VASCULAR DISEASE 12/30/2006  . GOITER 12/30/2006  . VITAMIN B12 DEFICIENCY 09/16/2008  . Carpal tunnel syndrome 12/30/2006  . CATARACTS, BILATERAL 12/30/2006  . HEMORRHOIDS 12/30/2006  . DEGENERATIVE JOINT DISEASE 02/10/2007  . INSOMNIA 10/21/2009  . CORNEAL TRANSPLANT 12/30/2006  . BKA, LEFT LEG 12/30/2006    Assessment: 79 year old female s/p amputation below right knee.  On Coumadin PTA at 2 mg daily with an INR of 2.02 today  Goal of Therapy:  INR 2-3 Monitor platelets by anticoagulation protocol: Yes   Plan:  1) Continue Coumadin 2 mg po daily 2) Daily INR  Thank you. Okey Regal, PharmD [phone]  12/10/2012,4:36 PM

## 2012-12-10 NOTE — Op Note (Signed)
OPERATIVE REPORT  DATE OF SURGERY: 12/10/2012  PATIENT:  Angelica Lawrence,  79 y.o. female  PRE-OPERATIVE DIAGNOSIS:  Osteomyelitis and ulcer right ankle  POST-OPERATIVE DIAGNOSIS:  osteomyelitis and ulcer right ankle  PROCEDURE:  Procedure(s): AMPUTATION BELOW RIGHT KNEE  SURGEON:  Surgeon(s): Nadara Mustard, MD  ANESTHESIA:   general  EBL:  min ML  SPECIMEN:  Source of Specimen:  right leg  TOURNIQUET:   Total Tourniquet Time Documented: Thigh (Right) - 8 minutes Total: Thigh (Right) - 8 minutes   PROCEDURE DETAILS: Patient is an 79 year old woman with peripheral vascular disease status post left transtibial amputation who presents at this time with osteomyelitis ulceration of the right ankle. She has failed conservative care presents at this time for transtibial amputation. Risks and benefits were discussed including infection neurovascular injury pain DVT need for additional surgery. Patient states she understands and wished to proceed at this time. Description of procedure patient was brought to the operating room and underwent a general anesthetic. After adequate levels of anesthesia were obtained patient's right lower extremity was prepped using DuraPrep draped into a sterile field in the foot was draped out of the sterile field with an impervious stockinette. A transverse first incision was made 11 cm distal to the tibial tubercle this curved proximally and a large posterior flap was created. The tibia was transected and beveled just proximal to the skin incision the fibula was transected just proximal to the tibial incision. The sciatic nerve was pulled cut and allowed to retract the vascular bundles were suture ligated with 2-0 silk. The tourniquet was deflated hemostasis was obtained. The deep and superficial fascial layers were closed using #1 PDS. The skin was closed using staples. The wound was covered with Adaptic orthopedic sponges AB dressing Kerlix and Coban. Patient was  extubated taken the PACU in stable condition plan for discharge to skilled nursing facility.  PLAN OF CARE: Admit to inpatient   PATIENT DISPOSITION:  PACU - hemodynamically stable.   Nadara Mustard, MD 12/10/2012 1:38 PM

## 2012-12-10 NOTE — Anesthesia Postprocedure Evaluation (Signed)
  Anesthesia Post-op Note  Patient: Angelica Lawrence  Procedure(s) Performed: Procedure(s): AMPUTATION BELOW RIGHT KNEE (Right)  Patient Location: PACU  Anesthesia Type:General  Level of Consciousness: awake  Airway and Oxygen Therapy: Patient Spontanous Breathing and Patient connected to nasal cannula oxygen  Post-op Pain: moderate  Post-op Assessment: Post-op Vital signs reviewed, Patient's Cardiovascular Status Stable, Respiratory Function Stable, Patent Airway and No signs of Nausea or vomiting  Post-op Vital Signs: Reviewed and stable  Complications: No apparent anesthesia complications

## 2012-12-10 NOTE — Anesthesia Preprocedure Evaluation (Addendum)
Anesthesia Evaluation  Patient identified by MRN, date of birth, ID band Patient awake    Reviewed: Allergy & Precautions, H&P , NPO status , Patient's Chart, lab work & pertinent test results, reviewed documented beta blocker date and time   Airway Mallampati: II TM Distance: >3 FB Neck ROM: Full    Dental no notable dental hx. (+) Edentulous Upper, Edentulous Lower and Dental Advisory Given   Pulmonary neg pulmonary ROS,  breath sounds clear to auscultation  Pulmonary exam normal       Cardiovascular hypertension, On Medications and On Home Beta Blockers + CAD, + CABG and + Peripheral Vascular Disease negative cardio ROS  Rhythm:Regular Rate:Normal     Neuro/Psych negative neurological ROS  negative psych ROS   GI/Hepatic negative GI ROS, Neg liver ROS,   Endo/Other  diabetes  Renal/GU Renal disease  negative genitourinary   Musculoskeletal   Abdominal   Peds  Hematology negative hematology ROS (+) anemia ,   Anesthesia Other Findings   Reproductive/Obstetrics negative OB ROS                           Anesthesia Physical Anesthesia Plan  ASA: III  Anesthesia Plan: General   Post-op Pain Management:    Induction: Intravenous  Airway Management Planned: LMA and Oral ETT  Additional Equipment:   Intra-op Plan:   Post-operative Plan: Extubation in OR  Informed Consent: I have reviewed the patients History and Physical, chart, labs and discussed the procedure including the risks, benefits and alternatives for the proposed anesthesia with the patient or authorized representative who has indicated his/her understanding and acceptance.   Dental advisory given  Plan Discussed with: CRNA  Anesthesia Plan Comments:         Anesthesia Quick Evaluation

## 2012-12-10 NOTE — H&P (Addendum)
Angelica Lawrence is an 79 y.o. female.   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 Patient has failed conservative treatment  presents at this time for transtibial amputation on the right.  Past Medical History  Diagnosis Date  . ALLERGIC RHINITIS 12/30/2006  . ANEMIA, IRON DEFICIENCY 10/21/2009  . COLONIC POLYPS, HX OF 12/30/2006  . CORONARY ARTERY DISEASE 12/30/2006  . DIABETES MELLITUS, TYPE I 12/30/2006  . DIVERTICULOSIS, COLON 12/30/2006  . HYPERLIPIDEMIA 12/30/2006  . HYPERTENSION 12/30/2006  . PERIPHERAL NEUROPATHY 12/30/2006  . PERIPHERAL VASCULAR DISEASE 12/30/2006  . GOITER 12/30/2006  . VITAMIN B12 DEFICIENCY 09/16/2008  . Carpal tunnel syndrome 12/30/2006  . CATARACTS, BILATERAL 12/30/2006  . HEMORRHOIDS 12/30/2006  . DEGENERATIVE JOINT DISEASE 02/10/2007  . INSOMNIA 10/21/2009  . CORNEAL TRANSPLANT 12/30/2006  . BKA, LEFT LEG 12/30/2006    Past Surgical History  Procedure Laterality Date  . Appendectomy    . Cholecystectomy    . Abdominal hysterectomy  1960's    and bso  . Below knee leg amputation  1989    L    Family History  Problem Relation Age of Onset  . Cancer Neg Hx   . Heart disease Neg Hx    Social History:  reports that she has never smoked. She does not have any smokeless tobacco history on file. She reports that she does not drink alcohol or use illicit drugs.  Allergies: No Known Allergies  No prescriptions prior to admission    No results found for this or any previous visit (from the past 48 hour(s)). No results found.  Review of Systems  All other systems reviewed and are negative.    There were no vitals taken for this visit. Physical Exam  On examination there is an ulcer which probes to bone over the lateral aspect right ankle  directly over the fibula. X-rays showed destructive bony changes of the fibula. There is cellulitis and drainage. Patient does not have a palpable pulse. Assessment/Plan Assessment: Diabetic insensate neuropathy with ulceration abscess and osteomyelitis fibula right ankle.  Plan: Due to failure conservative care patient presents at this time for transtibial amputation the right. Risks and benefits were discussed including infection neurovascular injury pain DVT nonhealing of the wound need for higher level amputation. Patient states she understands and wishes to proceed at this time.  Angelica Lawrence V 12/10/2012, 6:22 AM

## 2012-12-10 NOTE — Progress Notes (Signed)
Report rec'd from [facility], assuming care of patient

## 2012-12-11 LAB — GLUCOSE, CAPILLARY
Glucose-Capillary: 119 mg/dL — ABNORMAL HIGH (ref 70–99)
Glucose-Capillary: 184 mg/dL — ABNORMAL HIGH (ref 70–99)

## 2012-12-11 NOTE — Progress Notes (Signed)
Rept to MD. Pt had nausea with vomiting with admission to the unit. Nausea and vomiting relieved by IV Zofran. Pt ate approx 1/2 bowl of cereal this AM and pt vomited again prior to lunch. Per his order, will delay discharge until tomorrow. Will obtain OT/PT consults while here. Will continue to monitor.

## 2012-12-11 NOTE — Progress Notes (Signed)
ANTICOAGULATION CONSULT NOTE - Follow Up Consult  Pharmacy Consult for warfarin Indication: VTE prophylaxis  No Known Allergies  Labs:  Recent Labs  12/10/12 1008 12/11/12 1040  HGB 10.1*  --   HCT 28.5*  --   PLT 233  --   APTT 33  --   LABPROT 22.2* 25.2*  INR 2.02* 2.38*  CREATININE 0.88  --     The CrCl is unknown because both a height and weight (above a minimum accepted value) are required for this calculation.   Medical History: Past Medical History  Diagnosis Date  . ALLERGIC RHINITIS 12/30/2006  . ANEMIA, IRON DEFICIENCY 10/21/2009  . COLONIC POLYPS, HX OF 12/30/2006  . CORONARY ARTERY DISEASE 12/30/2006  . DIVERTICULOSIS, COLON 12/30/2006  . HYPERLIPIDEMIA 12/30/2006  . HYPERTENSION 12/30/2006  . PERIPHERAL NEUROPATHY 12/30/2006  . PERIPHERAL VASCULAR DISEASE 12/30/2006  . GOITER 12/30/2006  . VITAMIN B12 DEFICIENCY 09/16/2008  . Carpal tunnel syndrome 12/30/2006  . HEMORRHOIDS 12/30/2006  . DEGENERATIVE JOINT DISEASE 02/10/2007  . INSOMNIA 10/21/2009  . CORNEAL TRANSPLANT 12/30/2006    family denies this hx on 12/10/2012  . DVT (deep venous thrombosis) 10/2012    "left leg" (12/10/2012)  . Type II diabetes mellitus   . Stroke 1970's?    denies residual on 12/10/2012    Assessment: 79 year old female s/p amputation below right knee. On warfarin PTA at 2 mg daily with an INR of 2.02 yesterday. Was resumed on her home dose of warfarin and INR this morning is 2.38. Her CBC appears stable from lab values from a few months ago. No overt bleeding noted.  Goal of Therapy:  INR 2-3 Monitor platelets by anticoagulation protocol: Yes   Plan:  1. Continue warfarin 2mg  po daily 2. Daily INR while hospitalized  Leeasia Secrist D. Alexis Mizuno, PharmD Clinical Pharmacist Pager: [phone] 12/11/2012 11:52 AM

## 2012-12-11 NOTE — Progress Notes (Signed)
Rept to Dr. [name]. Pt BP this AM 88/41 pulse 106.  BP retaken prior to medication administration BP 92/48. Pt asymptomatic. Per his order, Atenolol and Isosorbide held. Also per his order, proceed with transfer back to [facility] this pm. Will continue to monitor.

## 2012-12-11 NOTE — Progress Notes (Signed)
Patient ID: Angelica Lawrence, female   DOB: [birth year], 78 y.o.   MRN: [id number] Postoperative day 1 right transtibial amputation. Patient is comfortable this morning. Patient is stable for discharge back to skilled nursing. Discharge summary dictated.

## 2012-12-11 NOTE — Progress Notes (Signed)
Patient ID: Angelica Lawrence, female   DOB: [birth year], 79 y.o.   MRN: [id number]  [facility]  No Known Allergies  Chief Complaint  Patient presents with  . Acute Visit    coumadin management     HPI She is being seen for there coumadin management for her dvt. She is tolerating the coumadin without any difficulty. She is not voicing any any pain at this time.  Her inr is 2.46 and she is taking coumadin 1.5 mg on Monday Wednesday; and  Friday with 2 mg on other days.   Past Medical History  Diagnosis Date  . ALLERGIC RHINITIS 12/30/2006  . ANEMIA, IRON DEFICIENCY 10/21/2009  . COLONIC POLYPS, HX OF 12/30/2006  . CORONARY ARTERY DISEASE 12/30/2006  . DIVERTICULOSIS, COLON 12/30/2006  . HYPERLIPIDEMIA 12/30/2006  . HYPERTENSION 12/30/2006  . PERIPHERAL NEUROPATHY 12/30/2006  . PERIPHERAL VASCULAR DISEASE 12/30/2006  . GOITER 12/30/2006  . VITAMIN B12 DEFICIENCY 09/16/2008  . Carpal tunnel syndrome 12/30/2006  . HEMORRHOIDS 12/30/2006  . DEGENERATIVE JOINT DISEASE 02/10/2007  . INSOMNIA 10/21/2009  . CORNEAL TRANSPLANT 12/30/2006    family denies this hx on 12/10/2012  . DVT (deep venous thrombosis) 10/2012    "left leg" (12/10/2012)  . Type II diabetes mellitus   . Stroke 1970's?    denies residual on 12/10/2012    Past Surgical History  Procedure Laterality Date  . Appendectomy    . Cholecystectomy    . Abdominal hysterectomy  1960's    and bso  . Below knee leg amputation Left 1989  . Coronary artery bypass graft  2003    CABG X3" (12/10/2012)  . Cardiac catheterization      "once" (12/10/2012)  . Cataract extraction, bilateral Bilateral    Medications reviewed per mar  Filed Vitals:   11/12/12 1457  BP: 120/70  Pulse: 76  Height: 5\' 6"  (1.676 m)  Weight: 152 lb (68.947 kg)    Review of Systems  Constitutional: Negative for malaise/fatigue.  Respiratory: Negative for shortness of breath.   Cardiovascular: Negative for chest pain.  Gastrointestinal: Negative for  abdominal pain.  Skin: Negative.   Neurological: Negative for headaches.  Psychiatric/Behavioral: The patient is not nervous/anxious.    Physical Exam  Constitutional: She appears well-developed and well-nourished.  Neck: Neck supple. No JVD present.  Cardiovascular: Normal rate, regular rhythm and intact distal pulses.   Respiratory: Effort normal and breath sounds normal. No respiratory distress.  GI: Soft. Bowel sounds are normal. She exhibits no distension.  Musculoskeletal: She exhibits no edema.  Is able to move extremities  Neurological: She is alert.  Skin: Skin is warm and dry.      ASSESSMENT PLAN  Will continue her coumadin 1.5 mg 3 days per week and 2 mg four days per week and will recheck inr in 2 weeks and will monitor her status

## 2012-12-11 NOTE — Discharge Summary (Signed)
Physician Discharge Summary  Patient ID: Angelica Lawrence MRN: [id number] DOB/AGE: 1926-08-02 79 y.o.  Admit date: 12/10/2012 Discharge date: 12/11/2012  Admission Diagnoses: Osteomyelitis right ankle  Discharge Diagnoses: Osteomyelitis right ankle Active Problems:   * No active hospital problems. *   Discharged Condition: stable  Hospital Course: Patient's hospital course was essentially unremarkable. She underwent a transtibial amputation postoperatively patient was stable and his discharge back to skilled nursing.  Consults: None  Significant Diagnostic Studies: labs: Routine labs  Treatments: surgery: See operative note  Discharge Exam: Blood pressure 88/41, pulse 106, temperature 98.2 F (36.8 C), temperature source Oral, resp. rate 16, SpO2 93.00%. Incision/Wound: dressing clean dry and intact  Disposition: 03-Skilled Nursing Facility     Medication List    ASK your doctor about these medications       atenolol 25 MG tablet  Commonly known as:  TENORMIN  Take 1 tablet (25 mg total) by mouth daily.     [employer] IRON 325 (65 FE) MG tablet  Generic drug:  ferrous sulfate  Take 325 mg by mouth daily.     feeding supplement Liqd  Take 237 mLs by mouth 2 (two) times daily between meals.     HYDROcodone-acetaminophen 5-325 MG per tablet  Commonly known as:  NORCO/VICODIN  Take 1 tablet by mouth every 4 (four) hours as needed.     isosorbide mononitrate 10 MG tablet  Commonly known as:  ISMO,MONOKET  Take 10 mg by mouth daily.     LINZESS 145 MCG Caps capsule  Generic drug:  Linaclotide  Take 145 mcg by mouth daily.     omeprazole 20 MG capsule  Commonly known as:  PRILOSEC  Take 1 capsule (20 mg total) by mouth daily.     simvastatin 40 MG tablet  Commonly known as:  ZOCOR  Take 1 tablet (40 mg total) by mouth at bedtime.     traZODone 100 MG tablet  Commonly known as:  DESYREL  Take 1 tablet (100 mg total) by mouth at bedtime.     warfarin 1 MG tablet   Commonly known as:  COUMADIN  Take 1.5 mg by mouth every evening.           Follow-up Information   Follow up with DUDA,MARCUS V, MD In 2 weeks.   Specialty:  Orthopedic Surgery   Contact information:   [address] [place] [address] [phone]       Signed: Nadara Mustard 12/11/2012, 6:24 AM

## 2012-12-12 ENCOUNTER — Encounter (HOSPITAL_COMMUNITY): Payer: Self-pay | Admitting: Orthopedic Surgery

## 2012-12-12 DIAGNOSIS — S88119A Complete traumatic amputation at level between knee and ankle, unspecified lower leg, initial encounter: Secondary | ICD-10-CM | POA: Diagnosis not present

## 2012-12-12 DIAGNOSIS — E1142 Type 2 diabetes mellitus with diabetic polyneuropathy: Secondary | ICD-10-CM | POA: Diagnosis present

## 2012-12-12 DIAGNOSIS — M1990 Unspecified osteoarthritis, unspecified site: Secondary | ICD-10-CM | POA: Diagnosis present

## 2012-12-12 DIAGNOSIS — E1049 Type 1 diabetes mellitus with other diabetic neurological complication: Secondary | ICD-10-CM | POA: Diagnosis present

## 2012-12-12 DIAGNOSIS — E785 Hyperlipidemia, unspecified: Secondary | ICD-10-CM | POA: Diagnosis present

## 2012-12-12 DIAGNOSIS — M869 Osteomyelitis, unspecified: Secondary | ICD-10-CM | POA: Diagnosis present

## 2012-12-12 DIAGNOSIS — M9080 Osteopathy in diseases classified elsewhere, unspecified site: Secondary | ICD-10-CM | POA: Diagnosis present

## 2012-12-12 DIAGNOSIS — G609 Hereditary and idiopathic neuropathy, unspecified: Secondary | ICD-10-CM | POA: Diagnosis present

## 2012-12-12 DIAGNOSIS — I2510 Atherosclerotic heart disease of native coronary artery without angina pectoris: Secondary | ICD-10-CM | POA: Diagnosis present

## 2012-12-12 DIAGNOSIS — I10 Essential (primary) hypertension: Secondary | ICD-10-CM | POA: Diagnosis present

## 2012-12-12 DIAGNOSIS — I739 Peripheral vascular disease, unspecified: Secondary | ICD-10-CM | POA: Diagnosis present

## 2012-12-12 DIAGNOSIS — E1069 Type 1 diabetes mellitus with other specified complication: Secondary | ICD-10-CM | POA: Diagnosis present

## 2012-12-12 DIAGNOSIS — L97309 Non-pressure chronic ulcer of unspecified ankle with unspecified severity: Secondary | ICD-10-CM | POA: Diagnosis present

## 2012-12-12 LAB — PROTIME-INR
INR: 2.55 — ABNORMAL HIGH (ref 0.00–1.49)
Prothrombin Time: 26.6 seconds — ABNORMAL HIGH (ref 11.6–15.2)

## 2012-12-12 NOTE — Progress Notes (Signed)
ANTICOAGULATION CONSULT NOTE - Follow Up Consult  Pharmacy Consult for warfarin Indication: VTE prophylaxis  No Known Allergies  Labs:  Recent Labs  12/10/12 1008 12/11/12 1040 12/12/12 0515  HGB 10.1*  --   --   HCT 28.5*  --   --   PLT 233  --   --   APTT 33  --   --   LABPROT 22.2* 25.2* 26.6*  INR 2.02* 2.38* 2.55*  CREATININE 0.88  --   --     The CrCl is unknown because both a height and weight (above a minimum accepted value) are required for this calculation.   Assessment: 79 year old female s/p amputation below right knee. On chronic warfarin therapy with dose of 2mg  daily PTA. This dose has been resumed and her INR has remained within range with a reading of 2.55 this morning. No bleeding noted. To SNF today.  Goal of Therapy:  INR 2-3 Monitor platelets by anticoagulation protocol: Yes   Plan:  1. Continue warfarin 2mg  po daily 2. Recommend resuming previous INR check schedule when back at SNF  Tyreesha Maharaj D. Zell Doucette, PharmD Clinical Pharmacist Pager: [phone] 12/12/2012 10:00 AM

## 2012-12-12 NOTE — Progress Notes (Signed)
Patient ID: Angelica Lawrence, female   DOB: 1926/08/27, 79 y.o.   MRN: [id number] No acute changes overnight.  Can discharge to SNF today.

## 2012-12-12 NOTE — Progress Notes (Signed)
Clinical social worker assisted with patient discharge to skilled nursing facility, [facility].  CSW addressed all family questions and concerns. CSW copied chart and added all important documents. CSW also set up patient transportation with [employer]. Clinical Social Worker will sign off for now as social work intervention is no longer needed.   Sabino Niemann, MSW, [employer] [phone]

## 2012-12-12 NOTE — Progress Notes (Signed)
Utilization review completed.  

## 2012-12-12 NOTE — Evaluation (Signed)
Physical Therapy Evaluation Patient Details Name: Angelica Lawrence MRN: [id number] DOB: 12/22/1926 Today's Date: 12/12/2012 Time: 4098-1191 PT Time Calculation (min): 19 min  PT Assessment / Plan / Recommendation History of Present Illness  79 yo female admitted with R ankle osteomyelitis/fracture, R LE DVT. Hx of L BKA-has prosthesis but pt has not been using for last few months.   Clinical Impression  Pt. Presents with limited ability to participate in PT due to nausea. RN had recently given zofran and this seemed to help some but pt. Still complained of feeling sick.  In terms of her mobility, she needs 2 assist for bed mobility and sitting up at edge of bed.  Will benefit from ongoing PT at SNF to maximize her functional mobility in bed and with transfers. She does have edema in L arm but no painful areas of tenderness. Pt. Is unclear of why her arm is swollen and cannot give account as to when or how swelling started.  In terms of her stay here, Amy (Child psychotherapist) states she will likely transfer back to [facility] today.  Therefore, PT goals not established for acute due to anticipated DC.  Will sign off at this time.    PT Assessment  All further PT needs can be met in the next venue of care    Follow Up Recommendations  SNF;Supervision/Assistance - 24 hour;Supervision for mobility/OOB    Does the patient have the potential to tolerate intense rehabilitation      Barriers to Discharge        Equipment Recommendations  None recommended by PT (will need access to W/C with elevating leg rests at SNF)    Recommendations for Other Services     Frequency      Precautions / Restrictions Precautions Precautions: Fall Restrictions Weight Bearing Restrictions: Yes RLE Weight Bearing: Non weight bearing LLE Weight Bearing: Non weight bearing Other Position/Activity Restrictions: Pt. reportedly has prosthesis for L LE but not used recently   Pertinent Vitals/Pain See vitals tab        Mobility  Bed Mobility Bed Mobility: Rolling Right;Right Sidelying to Sit;Sit to Sidelying Right Rolling Right: 1: +2 Total assist Rolling Right: Patient Percentage: 20% Right Sidelying to Sit: 1: +2 Total assist Right Sidelying to Sit: Patient Percentage: 10% Sit to Sidelying Right: 1: +2 Total assist Sit to Sidelying Right: Patient Percentage: 10% Details for Bed Mobility Assistance: cues for technque and hand placement; 2 assist for task completion due to weakness and interference from her nausea. Transfers Transfers: Not assessed    Exercises     PT Diagnosis: Generalized weakness;Other (comment) (difficulty transferring)  PT Problem List: Decreased strength;Decreased activity tolerance;Decreased balance;Decreased mobility;Decreased knowledge of use of DME;Decreased knowledge of precautions PT Treatment Interventions:       PT Goals(Current goals can be found in the care plan section)    Visit Information  Last PT Received On: 12/12/12 Assistance Needed: +2 History of Present Illness: 79 yo female admitted with R ankle osteomyelitis/fracture, R LE DVT. Hx of L BKA-has prosthesis but pt has not been using for last few months.        Prior Functioning  Home Living Family/patient expects to be discharged to:: Skilled nursing facility Prior Function Level of Independence: Needs assistance Gait / Transfers Assistance Needed: total assist for transfers bed<> WC,BSC Communication Communication: No difficulties    Cognition  Cognition Arousal/Alertness: Lethargic;Suspect due to medications Behavior During Therapy: [facility] for tasks assessed/performed Overall Cognitive Status: Within Functional Limits  for tasks assessed    Extremity/Trunk Assessment Upper Extremity Assessment Upper Extremity Assessment: LUE deficits/detail (R UE WFL) LUE Deficits / Details: L UE noted to hace pitting edema from upper arm to hand.  Pt. had been lying on this arm while in bed.  She is able  to move hand and wrist to some degree, but cannot raise arm to 90 degrees in either plane.  Able to bend and straighten elbow.  LUE:  (unabl to fully assess due to nausea; RN present and aware) Lower Extremity Assessment Lower Extremity Assessment: RLE deficits/detail RLE Deficits / Details: able to lift R LE off bed but unable to fully assess due to nausea and pt. feeling poorly RLE:  (unable to fully assess due to nausea )   Balance Balance Balance Assessed: Yes Static Sitting Balance Static Sitting - Balance Support: Bilateral upper extremity supported Static Sitting - Level of Assistance: 3: Mod assist Static Sitting - Comment/# of Minutes: 2  End of Session PT - End of Session Activity Tolerance: Treatment limited secondary to medical complications (Comment);Other (comment) (nausea) Patient left: in bed;with call bell/phone within reach Nurse Communication: Mobility status;Other (comment) (nausea; edema of L UE)  GP     Ferman Hamming 12/12/2012, 11:39 AM Weldon Picking PT Acute Rehab Services [phone] Bee[place] [phone]

## 2012-12-12 NOTE — Discharge Planning (Signed)
Report called to Charmaine at [facility].

## 2012-12-12 NOTE — Evaluation (Signed)
Occupational Therapy Evaluation Patient Details Name: Angelica Lawrence MRN: [id number] DOB: [birth year] Today's Date: 12/12/2012 Time: 8841-6606 OT Time Calculation (min): 17 min  OT Assessment / Plan / Recommendation History of present illness 79 yo female admitted with R ankle osteomyelitis/fracture, R LE DVT. Hx of L BKA-has prosthesis but pt has not been using for last few months.    Clinical Impression   Pt admitted with above. Pt limited during OT session due to fatigue and nausea.  Pt from SNF with plans to return to SNF (like transferring back to [facility] today).  Will defer further OT needs to next venue.    OT Assessment  All further OT needs can be met in the next venue of care    Follow Up Recommendations  SNF    Barriers to Discharge      Equipment Recommendations   (TBD at next venue)    Recommendations for Other Services    Frequency       Precautions / Restrictions Precautions Precautions: Fall Restrictions Weight Bearing Restrictions: Yes RLE Weight Bearing: Non weight bearing LLE Weight Bearing: Non weight bearing Other Position/Activity Restrictions: Pt. reportedly has prosthesis for L LE but not used recently   Pertinent Vitals/Pain See vitals    ADL  Grooming: Performed;Wash/dry face;Moderate assistance Where Assessed - Grooming: Supine, head of bed up Upper Body Bathing: Simulated;+1 Total assistance Where Assessed - Upper Body Bathing: Supine, head of bed up Lower Body Bathing: Simulated;+1 Total assistance Where Assessed - Lower Body Bathing: Supine, head of bed up Upper Body Dressing: Simulated;+1 Total assistance Where Assessed - Upper Body Dressing: Supine, head of bed up Lower Body Dressing: Simulated;+1 Total assistance Where Assessed - Lower Body Dressing: Supine, head of bed up ADL Comments: pt c/o nausea    OT Diagnosis:    OT Problem List:   OT Treatment Interventions:     OT Goals(Current goals can be found in the care plan  section)    Visit Information  Last OT Received On: 12/12/12 Assistance Needed: +2 History of Present Illness: 79 yo female admitted with R ankle osteomyelitis/fracture, R LE DVT. Hx of L BKA-has prosthesis but pt has not been using for last few months.        Prior Functioning     Home Living Family/patient expects to be discharged to:: Skilled nursing facility Prior Function Level of Independence: Needs assistance ADL's / Homemaking Assistance Needed: max assist Communication Communication: No difficulties         Vision/Perception     Cognition  Cognition Arousal/Alertness: Lethargic Behavior During Therapy: Flat affect Overall Cognitive Status: Within Functional Limits for tasks assessed    Extremity/Trunk Assessment Upper Extremity Assessment Upper Extremity Assessment: LUE deficits/detail LUE Deficits / Details: 2+/5. Edematous.     Mobility Bed Mobility Bed Mobility: Rolling Right;Rolling Left Rolling Right: 1: +2 Total assist Rolling Right: Patient Percentage: 20% Rolling Left: 1: +2 Total assist Rolling Left: Patient Percentage: 20%     Exercise General Exercises - Upper Extremity Shoulder Flexion: AROM;AAROM;Both;10 reps Shoulder ABduction: AROM;AAROM;Both;10 reps Shoulder ADduction: AROM;AAROM;Both;10 reps Elbow Flexion: AROM;Both;10 reps Elbow Extension: AROM;Both;10 reps Wrist Flexion: AROM;Both;10 reps Wrist Extension: AROM;Both;10 reps Digit Composite Flexion: AROM;Both;10 reps Composite Extension: AROM;Both;10 reps   Balance     End of Session OT - End of Session Activity Tolerance: Patient limited by fatigue Patient left: in bed;with call bell/phone within reach  GO   12/12/2012 Cipriano Mile OTR/L Pager [phone] Office [phone]   [name],  [name] 12/12/2012, 4:43 PM

## 2012-12-15 ENCOUNTER — Other Ambulatory Visit: Payer: Self-pay | Admitting: *Deleted

## 2012-12-15 MED ORDER — HYDROCODONE-ACETAMINOPHEN 5-325 MG PO TABS: ORAL_TABLET | ORAL | Status: DC

## 2012-12-15 NOTE — Progress Notes (Signed)
Patient ID: Angelica Lawrence, female   DOB: 1926/08/04, 79 y.o.   MRN: [id number]  [facility]  No Known Allergies  Chief Complaint  Patient presents with  . Medical Managment of Chronic Issues    HPI  She is being seen for the management of her chronic illnesses. She is awaiting for an amputation of her right lower extremity by Dr. [name] at this time. There are no concerns being voiced by the nursing staff at this time.   Past Medical History  Diagnosis Date  . ALLERGIC RHINITIS 12/30/2006  . ANEMIA, IRON DEFICIENCY 10/21/2009  . COLONIC POLYPS, HX OF 12/30/2006  . CORONARY ARTERY DISEASE 12/30/2006  . DIVERTICULOSIS, COLON 12/30/2006  . HYPERLIPIDEMIA 12/30/2006  . HYPERTENSION 12/30/2006  . PERIPHERAL NEUROPATHY 12/30/2006  . PERIPHERAL VASCULAR DISEASE 12/30/2006  . GOITER 12/30/2006  . VITAMIN B12 DEFICIENCY 09/16/2008  . Carpal tunnel syndrome 12/30/2006  . HEMORRHOIDS 12/30/2006  . DEGENERATIVE JOINT DISEASE 02/10/2007  . INSOMNIA 10/21/2009  . CORNEAL TRANSPLANT 12/30/2006    family denies this hx on 12/10/2012  . DVT (deep venous thrombosis) 10/2012    "left leg" (12/10/2012)  . Type II diabetes mellitus   . Stroke 1970's?    denies residual on 12/10/2012    Past Surgical History  Procedure Laterality Date  . Appendectomy    . Cholecystectomy    . Abdominal hysterectomy  1960's    and bso  . Below knee leg amputation Left 1989  . Coronary artery bypass graft  2003    CABG X3" (12/10/2012)  . Cardiac catheterization      "once" (12/10/2012)  . Cataract extraction, bilateral Bilateral     Filed Vitals:   12/15/12 1319  BP: 124/58  Pulse: 80  Height: 5\' 1"  (1.549 m)  Weight: 162 lb (73.483 kg)     Labs reviewed: Basic Metabolic Panel:  Recent Labs  56/38/75 1048 10/19/12 0944 10/20/12 0450 10/21/12 0533  NA 128* 133* 136  --   K 3.8 4.1 4.2  --   CL 96 103 106  --   CO2 20 18* 22  --   GLUCOSE 151* 146* 147*  --   BUN 40* 34* 27*  --   CREATININE  2.02* 1.52* 1.27* 1.06  CALCIUM 8.2* 8.3* 8.7  --      MEDICATIONS  Atenolol 25 mg daily mvi daily Iron 325 mg daily Isordil 10 mg daily linzess 145 mcg daily miralax 17 gm daily vicodin 5/325 mg twice daily and every 6 hours as needed for pain prilosec 20 mg daily zocor 40 mg daily Trazodone 100 mg nightly Coumadin 1.5 mg three days per week 2 mg 4 days per week.    Review of Systems  Constitutional: Negative for malaise/fatigue.  Respiratory: Negative for cough and shortness of breath.   Cardiovascular: Negative for chest pain and palpitations.  Gastrointestinal: Negative for heartburn and abdominal pain.  Musculoskeletal: Negative for myalgias and joint pain.  Skin:       Ulceration on right lower leg is awaiting an amputation   Neurological: Negative for headaches.  Psychiatric/Behavioral: Negative for depression.    Physical Exam  Constitutional: She appears well-developed and well-nourished.  Neck: Neck supple. No JVD present.  Cardiovascular: Normal rate and regular rhythm.   No pp on right lower extremity   Respiratory: Breath sounds normal. No respiratory distress. She has no wheezes.  GI: Soft. Bowel sounds are normal. She exhibits no distension. There is no tenderness.  Musculoskeletal: She  exhibits no edema.  Has left bka; is awaiting right bka   Neurological: She is alert.  Skin: Skin is warm and dry.  Dressing to right ankle intact         ASSESSMENT PLAN  1. Hypertension: will continue atenolol 25 mg daily and will monitor her status  2. Anemia: will continue iron daily 3. Gerd: will continue prilosec 20 mg daily 4. Constipation: is stable will continue miralax daily and linzess 145 mcg daily 5. Dyslipidemia: will continue zocor 40 mg daily 6. Insomnia: will continue trazodone 100 mg nightly  7. DVT: will continue her coumadin therapy and will monitor her status  8. Pvd: will not make changes to her wound care to her right ankle at this time;  she is awaiting a right lower extremity amputation from Dr. [name].

## 2012-12-16 ENCOUNTER — Encounter: Payer: Self-pay | Admitting: Internal Medicine

## 2012-12-16 ENCOUNTER — Non-Acute Institutional Stay (SKILLED_NURSING_FACILITY): Payer: Medicare Other | Admitting: Internal Medicine

## 2012-12-16 ENCOUNTER — Encounter (HOSPITAL_COMMUNITY): Payer: Self-pay | Admitting: Emergency Medicine

## 2012-12-16 ENCOUNTER — Emergency Department (HOSPITAL_COMMUNITY)
Admission: EM | Admit: 2012-12-16 | Discharge: 2012-12-16 | Disposition: A | Discharge status: Skilled Nursing Facility | Payer: Medicare Other | Attending: Emergency Medicine | Admitting: Emergency Medicine

## 2012-12-16 ENCOUNTER — Emergency Department (HOSPITAL_COMMUNITY): Payer: Medicare Other

## 2012-12-16 DIAGNOSIS — Z951 Presence of aortocoronary bypass graft: Secondary | ICD-10-CM | POA: Insufficient documentation

## 2012-12-16 DIAGNOSIS — I10 Essential (primary) hypertension: Secondary | ICD-10-CM | POA: Insufficient documentation

## 2012-12-16 DIAGNOSIS — Z860100 Personal history of colon polyps, unspecified: Secondary | ICD-10-CM | POA: Insufficient documentation

## 2012-12-16 DIAGNOSIS — Z8679 Personal history of other diseases of the circulatory system: Secondary | ICD-10-CM | POA: Insufficient documentation

## 2012-12-16 DIAGNOSIS — D509 Iron deficiency anemia, unspecified: Secondary | ICD-10-CM | POA: Insufficient documentation

## 2012-12-16 DIAGNOSIS — E785 Hyperlipidemia, unspecified: Secondary | ICD-10-CM | POA: Insufficient documentation

## 2012-12-16 DIAGNOSIS — Z86718 Personal history of other venous thrombosis and embolism: Secondary | ICD-10-CM | POA: Insufficient documentation

## 2012-12-16 DIAGNOSIS — S88119A Complete traumatic amputation at level between knee and ankle, unspecified lower leg, initial encounter: Secondary | ICD-10-CM | POA: Insufficient documentation

## 2012-12-16 DIAGNOSIS — K5904 Chronic idiopathic constipation: Secondary | ICD-10-CM

## 2012-12-16 DIAGNOSIS — I82401 Acute embolism and thrombosis of unspecified deep veins of right lower extremity: Secondary | ICD-10-CM

## 2012-12-16 DIAGNOSIS — R5383 Other fatigue: Secondary | ICD-10-CM

## 2012-12-16 DIAGNOSIS — I2510 Atherosclerotic heart disease of native coronary artery without angina pectoris: Secondary | ICD-10-CM | POA: Insufficient documentation

## 2012-12-16 DIAGNOSIS — Z7901 Long term (current) use of anticoagulants: Secondary | ICD-10-CM | POA: Insufficient documentation

## 2012-12-16 DIAGNOSIS — R531 Weakness: Secondary | ICD-10-CM

## 2012-12-16 DIAGNOSIS — R5381 Other malaise: Secondary | ICD-10-CM

## 2012-12-16 DIAGNOSIS — K5900 Constipation, unspecified: Secondary | ICD-10-CM

## 2012-12-16 DIAGNOSIS — R2681 Unsteadiness on feet: Secondary | ICD-10-CM

## 2012-12-16 DIAGNOSIS — R112 Nausea with vomiting, unspecified: Secondary | ICD-10-CM | POA: Insufficient documentation

## 2012-12-16 DIAGNOSIS — Z8669 Personal history of other diseases of the nervous system and sense organs: Secondary | ICD-10-CM | POA: Insufficient documentation

## 2012-12-16 DIAGNOSIS — M86171 Other acute osteomyelitis, right ankle and foot: Secondary | ICD-10-CM

## 2012-12-16 DIAGNOSIS — E119 Type 2 diabetes mellitus without complications: Secondary | ICD-10-CM | POA: Insufficient documentation

## 2012-12-16 DIAGNOSIS — I509 Heart failure, unspecified: Secondary | ICD-10-CM | POA: Insufficient documentation

## 2012-12-16 DIAGNOSIS — E871 Hypo-osmolality and hyponatremia: Secondary | ICD-10-CM

## 2012-12-16 DIAGNOSIS — I82409 Acute embolism and thrombosis of unspecified deep veins of unspecified lower extremity: Secondary | ICD-10-CM

## 2012-12-16 DIAGNOSIS — M86179 Other acute osteomyelitis, unspecified ankle and foot: Secondary | ICD-10-CM

## 2012-12-16 DIAGNOSIS — Z8673 Personal history of transient ischemic attack (TIA), and cerebral infarction without residual deficits: Secondary | ICD-10-CM | POA: Insufficient documentation

## 2012-12-16 DIAGNOSIS — R269 Unspecified abnormalities of gait and mobility: Secondary | ICD-10-CM

## 2012-12-16 LAB — CBC WITH DIFFERENTIAL/PLATELET
Basophils Absolute: 0 10*3/uL (ref 0.0–0.1)
Basophils Relative: 0 % (ref 0–1)
Eosinophils Relative: 3 % (ref 0–5)
HCT: 28.7 % — ABNORMAL LOW (ref 36.0–46.0)
Lymphocytes Relative: 18 % (ref 12–46)
Lymphs Abs: 1.4 10*3/uL (ref 0.7–4.0)
MCV: 90.8 fL (ref 78.0–100.0)
Monocytes Absolute: 0.8 10*3/uL (ref 0.1–1.0)
Neutro Abs: 5.4 10*3/uL (ref 1.7–7.7)
Neutrophils Relative %: 69 % (ref 43–77)
Platelets: 270 10*3/uL (ref 150–400)
RBC: 3.16 MIL/uL — ABNORMAL LOW (ref 3.87–5.11)
RDW: 15.2 % (ref 11.5–15.5)
WBC: 7.7 10*3/uL (ref 4.0–10.5)

## 2012-12-16 LAB — URINALYSIS, ROUTINE W REFLEX MICROSCOPIC
Bilirubin Urine: NEGATIVE
Glucose, UA: NEGATIVE mg/dL
Hgb urine dipstick: NEGATIVE
Ketones, ur: NEGATIVE mg/dL
Nitrite: NEGATIVE
Protein, ur: NEGATIVE mg/dL
Specific Gravity, Urine: 1.012 (ref 1.005–1.030)
Urobilinogen, UA: 1 mg/dL (ref 0.0–1.0)
pH: 5 (ref 5.0–8.0)

## 2012-12-16 LAB — COMPREHENSIVE METABOLIC PANEL
ALT: 8 U/L (ref 0–35)
AST: 31 U/L (ref 0–37)
Albumin: 1.7 g/dL — ABNORMAL LOW (ref 3.5–5.2)
Alkaline Phosphatase: 110 U/L (ref 39–117)
CO2: 21 mEq/L (ref 19–32)
Chloride: 100 mEq/L (ref 96–112)
GFR calc Af Amer: 61 mL/min — ABNORMAL LOW (ref >90)
GFR calc non Af Amer: 53 mL/min — ABNORMAL LOW (ref >90)
Glucose, Bld: 124 mg/dL — ABNORMAL HIGH (ref 70–99)
Potassium: 5.1 mEq/L (ref 3.5–5.1)
Sodium: 133 mEq/L — ABNORMAL LOW (ref 135–145)
Total Bilirubin: 0.4 mg/dL (ref 0.3–1.2)

## 2012-12-16 LAB — URINE MICROSCOPIC-ADD ON

## 2012-12-16 LAB — APTT: aPTT: 46 seconds — ABNORMAL HIGH (ref 24–37)

## 2012-12-16 MED ORDER — FUROSEMIDE 40 MG PO TABS: ORAL_TABLET | ORAL | Status: DC

## 2012-12-16 MED ORDER — HYDROCODONE-ACETAMINOPHEN 5-325 MG PO TABS
1.0000 | ORAL_TABLET | Freq: Once | ORAL | Status: AC
  Administered 2012-12-16: 1 via ORAL
  Filled 2012-12-16: qty 1

## 2012-12-16 MED ORDER — FUROSEMIDE 10 MG/ML IJ SOLN: 60.0000 mg | Freq: Once | INTRAMUSCULAR | Status: DC

## 2012-12-16 MED ORDER — FUROSEMIDE 10 MG/ML IJ SOLN
60.0000 mg | Freq: Once | INTRAMUSCULAR | Status: AC
  Administered 2012-12-16: 60 mg via INTRAMUSCULAR
  Filled 2012-12-16: qty 6

## 2012-12-16 NOTE — ED Notes (Signed)
According to EMS, personnel at the nursing home has said that this patient has had increased generalized edema and her blood is clotting too quickly to check her labs.  They were also unable to get an IV in her to treat her so they called [facility] to transport the [facility] to [facility] to be treated.  EMS says she is GCS of 15 and is at [facility] for rehabilitation due to a recent amputation.  Patient was transported here with no incidents.

## 2012-12-16 NOTE — ED Notes (Signed)
Family at bedside. 

## 2012-12-16 NOTE — ED Provider Notes (Signed)
CSN: [id number]     Arrival date & time 12/16/12  1805 History   First MD Initiated Contact with Patient 12/16/12 1825     Chief Complaint  Patient presents with  . Edema    Low sodium, generalized edema, clotting too quiclkly for the nursing home to draw labs   (Consider location/radiation/quality/duration/timing/severity/associated sxs/prior Treatment) HPI  Pt states she feels fine. She had just finished her PT and wanted to rest. She states "they were unable to get blood today". States she has had swelling in her LUE since she was in the hospital last week. She had a Rt BKA done 8 days ago and was discharged from the hospital 4 days ago. She denies chest pain, shortness of breath, abdominal pain, or fever. She states she's had normal bowel movements the past 2 days. She states she had mild pain in her right leg last night but it does not hurt today. He had nausea and vomiting while she was in the hospital and when she was discharged from the hospital however she has been able to eat soup the last couple days. According to paperwork sent from her nursing home she has been having edema and her sodium today was 129. They were unable to get blood to check her INR because she is on Coumadin.  PCP Dr Lacey Jensen Orthopedics Dr [name] Cardiology Dr Tenny Craw  Past Medical History  Diagnosis Date  . ALLERGIC RHINITIS 12/30/2006  . ANEMIA, IRON DEFICIENCY 10/21/2009  . COLONIC POLYPS, HX OF 12/30/2006  . CORONARY ARTERY DISEASE 12/30/2006  . DIVERTICULOSIS, COLON 12/30/2006  . HYPERLIPIDEMIA 12/30/2006  . HYPERTENSION 12/30/2006  . PERIPHERAL NEUROPATHY 12/30/2006  . PERIPHERAL VASCULAR DISEASE 12/30/2006  . GOITER 12/30/2006  . VITAMIN B12 DEFICIENCY 09/16/2008  . Carpal tunnel syndrome 12/30/2006  . HEMORRHOIDS 12/30/2006  . DEGENERATIVE JOINT DISEASE 02/10/2007  . INSOMNIA 10/21/2009  . CORNEAL TRANSPLANT 12/30/2006    family denies this hx on 12/10/2012  . DVT (deep venous thrombosis) 10/2012     "left leg" (12/10/2012)  . Type II diabetes mellitus   . Stroke 1970's?    denies residual on 12/10/2012   Past Surgical History  Procedure Laterality Date  . Appendectomy    . Cholecystectomy    . Abdominal hysterectomy  1960's    and bso  . Below knee leg amputation Left 1989  . Coronary artery bypass graft  2003    CABG X3" (12/10/2012)  . Cardiac catheterization      "once" (12/10/2012)  . Cataract extraction, bilateral Bilateral   . Below knee leg amputation Right 12/10/2012  . Amputation Right 12/10/2012    Procedure: AMPUTATION BELOW RIGHT KNEE;  Surgeon: Nadara Mustard, MD;  Location: [facility] OR;  Service: Orthopedics;  Laterality: Right;   Family History  Problem Relation Age of Onset  . Cancer Neg Hx   . Heart disease Neg Hx    History  Substance Use Topics  . Smoking status: Never Smoker   . Smokeless tobacco: Never Used  . Alcohol Use: No   Pt in nursing home for rehab  OB History   Grav Para Term Preterm Abortions TAB SAB Ect Mult Living                 Review of Systems  All other systems reviewed and are negative.    Allergies  Review of patient's allergies indicates no known allergies.  Home Medications   Current Outpatient Rx  Name  Route  Sig  Dispense  Refill  . atenolol (TENORMIN) 25 MG tablet   Oral   Take 1 tablet (25 mg total) by mouth daily.   30 tablet   0   . ferrous sulfate ([facility] IRON) 325 (65 FE) MG tablet   Oral   Take 325 mg by mouth daily.           . furosemide (LASIX) 40 MG tablet   Oral   Take 40 mg by mouth once.         Marland Kitchen HYDROcodone-acetaminophen (NORCO/VICODIN) 5-325 MG per tablet   Oral   Take 1 tablet by mouth every 4 (four) hours as needed for pain.         . isosorbide mononitrate (ISMO,MONOKET) 10 MG tablet   Oral   Take 10 mg by mouth daily.         . Linaclotide (LINZESS) 145 MCG CAPS   Oral   Take 145 mcg by mouth daily.         . Multiple Vitamin (MULTIVITAMIN WITH MINERALS) TABS tablet   Oral    Take 1 tablet by mouth 2 (two) times daily.         Marland Kitchen omeprazole (PRILOSEC) 20 MG capsule   Oral   Take 1 capsule (20 mg total) by mouth daily.   30 capsule   0   . polyethylene glycol (MIRALAX / GLYCOLAX) packet   Oral   Take 17 g by mouth daily.         . potassium chloride SA (K-DUR,KLOR-CON) 20 MEQ tablet   Oral   Take 40 mEq by mouth once.         . simvastatin (ZOCOR) 40 MG tablet   Oral   Take 1 tablet (40 mg total) by mouth at bedtime.   30 tablet   0   . traZODone (DESYREL) 100 MG tablet   Oral   Take 1 tablet (100 mg total) by mouth at bedtime.   30 tablet   3   . tuberculin (APLISOL) 5 UNIT/0.1ML injection   Intradermal   Inject 5 Units into the skin 2 (two) times a week. On Sunday and Friday. Stop on 12/14/12         . warfarin (COUMADIN) 1 MG tablet   Oral   Take 1.5 mg by mouth every evening.          BP 114/64  Temp(Src) 97.9 F (36.6 C)  Resp 22  SpO2 100%  Vital signs normal    Physical Exam  Nursing note and vitals reviewed. Constitutional: She is oriented to person, place, and time. She appears well-developed and well-nourished.  Non-toxic appearance. She does not appear ill. No distress.  HENT:  Head: Normocephalic and atraumatic.  Right Ear: External ear normal.  Left Ear: External ear normal.  Nose: Nose normal. No mucosal edema or rhinorrhea.  Mouth/Throat: Oropharynx is clear and moist and mucous membranes are normal. No dental abscesses or edematous.  Eyes: Conjunctivae and EOM are normal. Pupils are equal, round, and reactive to light.  Neck: Normal range of motion and full passive range of motion without pain. Neck supple.  Cardiovascular: Normal rate, regular rhythm and normal heart sounds.  Exam reveals no gallop and no friction rub.   No murmur heard. Pulmonary/Chest: Effort normal and breath sounds normal. No respiratory distress. She has no wheezes. She has no rhonchi. She has no rales. She exhibits no tenderness and  no crepitus.  Abdominal: Soft. Normal appearance and bowel  sounds are normal. She exhibits no distension. There is no tenderness. There is no rebound and no guarding.  Musculoskeletal: Normal range of motion. She exhibits no edema and no tenderness.  Pt is s/p bilateral BKA, the left is old, the right is wrapped. The dressing is clean, no bleeding or stains.  Pt has pitting edema of her LUE to the shoulder, upper arm, lower arm, dorsum of hand. She has minimal swelling of her right upper arm.   Neurological: She is alert and oriented to person, place, and time. She has normal strength. No cranial nerve deficit.  Skin: Skin is warm, dry and intact. No rash noted. No erythema. No pallor.  Psychiatric: She has a normal mood and affect. Her speech is normal and behavior is normal. Her mood appears not anxious.    ED Course  Procedures (including critical care time) Medications  furosemide (LASIX) injection 60 mg (not administered)    Discussed test results with patient and need for admission.   21:03 Dr Betti Cruz, feels since she is asymptomatic she could be discharged on oral lasix 40 mg BID. Pt again denies chest pain, SOB or exertional SOB.  Foley catheter inserted and pt given first dose of lasix IM (received one dose of oral lasix at [facility] today of 40 mg).   Labs Review Results for orders placed during the hospital encounter of 12/16/12  CBC WITH DIFFERENTIAL      Result Value Range   WBC 7.7  4.0 - 10.5 K/uL   RBC 3.16 (*) 3.87 - 5.11 MIL/uL   Hemoglobin 9.9 (*) 12.0 - 15.0 g/dL   HCT 16.1 (*) 09.6 - 04.5 %   MCV 90.8  78.0 - 100.0 fL   MCH 31.3  26.0 - 34.0 pg   MCHC 34.5  30.0 - 36.0 g/dL   RDW 40.9  81.1 - 91.4 %   Platelets 270  150 - 400 K/uL   Neutrophils Relative % 69  43 - 77 %   Neutro Abs 5.4  1.7 - 7.7 K/uL   Lymphocytes Relative 18  12 - 46 %   Lymphs Abs 1.4  0.7 - 4.0 K/uL   Monocytes Relative 10  3 - 12 %   Monocytes Absolute 0.8  0.1 - 1.0 K/uL   Eosinophils  Relative 3  0 - 5 %   Eosinophils Absolute 0.2  0.0 - 0.7 K/uL   Basophils Relative 0  0 - 1 %   Basophils Absolute 0.0  0.0 - 0.1 K/uL  PRO B NATRIURETIC PEPTIDE      Result Value Range   Pro B Natriuretic peptide (BNP) 18490.0 (*) 0 - 450 pg/mL  APTT      Result Value Range   aPTT 46 (*) 24 - 37 seconds  PROTIME-INR      Result Value Range   Prothrombin Time 24.3 (*) 11.6 - 15.2 seconds   INR 2.27 (*) 0.00 - 1.49    Laboratory interpretation all normal except therapeutic INR, elevated BNP, chronic anemia    Imaging Review Dg Chest Portable 1 View  12/16/2012   CLINICAL DATA:  Pulmonary edema  EXAM: PORTABLE CHEST - 1 VIEW  COMPARISON:  Prior chest x-ray 12/10/2012  FINDINGS: Increased pulmonary vascular congestion now with mild interstitial edema. There are moderately large bilateral layering pleural effusions with associated bibasilar atelectasis versus infiltrate. Stable cardiomegaly and mediastinal contours. Patient is status post median sternotomy with evidence of multivessel CABG including renal bypass. Aortic atherosclerotic calcifications are  noted. Stable appearance of remote left humeral head and neck fracture. No acute osseous abnormality.  IMPRESSION: 1. Mild -moderate CHF 2. Moderately large bilateral layering pleural effusions with bibasilar atelectasis and versus infiltrates.   Electronically Signed   By: Malachy Moan M.D.   On: 12/16/2012 19:50    Dg Chest 2 View  12/10/2012   IMPRESSION: Possible mild interstitial edema.  Small bilateral pleural effusions, right greater than left.   Electronically Signed   By: Charline Bills M.D.   On: 12/10/2012 10:36    MDM   1. CHF (congestive heart failure)     New Prescriptions   FUROSEMIDE (LASIX) 40 MG TABLET    Take 1 po BID x 3 days then once a day     Plan discharge   Devoria Albe, MD, Franz Dell, MD 12/16/12 2115

## 2012-12-16 NOTE — ED Notes (Signed)
Upon insertion of urinary catheter, the urine return was cloudy and yellow.

## 2012-12-16 NOTE — Progress Notes (Signed)
Patient ID: Angelica Lawrence, female   DOB: [birth year], 78 y.o.   MRN: [id number] Provider:  Gwenith Spitz. Renato Gails, D.O., C.M.D. Location:  [facility]  PCP: [name], MD  Code Status: full code  No Known Allergies  Chief Complaint  Patient presents with  . Hospitalization Follow-up    readmission s/p hospitalization for planned right transtibial amputation due to right lateral malleolar osteomyelitis with abscess  . Acute Visit    volume overload with CHF on xray, weight up 9 lbs in 3 days, unable to draw INR     HPI: 79 y.o. female with h/o DMII with neuropathy, CAD, htn, hyperlipidemia, diverticulosis and prior left transtibial amputation was readmitted for continued short term rehab for generalized weakness s/p right transtibial amputation 12/10/12 due to right malleolar osteomyelitis with abscess.  She has since been gaining weight and having increased shortness of breath.  She is on coumadin for prior right LE DVT and her INR could not be drawn this am.  Her BMP has returned revealing hyponatremia.    ROS: Review of Systems  Constitutional: Positive for malaise/fatigue. Negative for fever, chills and diaphoresis.       Weight gain  HENT: Negative for congestion.   Eyes: Negative for blurred vision.  Respiratory: Positive for cough and shortness of breath.   Cardiovascular: Positive for leg swelling. Negative for chest pain.  Gastrointestinal: Negative for heartburn and abdominal pain.  Genitourinary: Negative for dysuria.  Musculoskeletal: Positive for joint pain.  Skin: Negative for rash.  Neurological: Positive for weakness. Negative for loss of consciousness and headaches.  Endo/Heme/Allergies: Bruises/bleeds easily.  Psychiatric/Behavioral: Negative for depression.     Past Medical History  Diagnosis Date  . ALLERGIC RHINITIS 12/30/2006  . ANEMIA, IRON DEFICIENCY 10/21/2009  . COLONIC POLYPS, HX OF 12/30/2006  . CORONARY ARTERY DISEASE 12/30/2006  .  DIVERTICULOSIS, COLON 12/30/2006  . HYPERLIPIDEMIA 12/30/2006  . HYPERTENSION 12/30/2006  . PERIPHERAL NEUROPATHY 12/30/2006  . PERIPHERAL VASCULAR DISEASE 12/30/2006  . GOITER 12/30/2006  . VITAMIN B12 DEFICIENCY 09/16/2008  . Carpal tunnel syndrome 12/30/2006  . HEMORRHOIDS 12/30/2006  . DEGENERATIVE JOINT DISEASE 02/10/2007  . INSOMNIA 10/21/2009  . CORNEAL TRANSPLANT 12/30/2006    family denies this hx on 12/10/2012  . DVT (deep venous thrombosis) 10/2012    "left leg" (12/10/2012)  . Type II diabetes mellitus   . Stroke 1970's?    denies residual on 12/10/2012   Past Surgical History  Procedure Laterality Date  . Appendectomy    . Cholecystectomy    . Abdominal hysterectomy  1960's    and bso  . Below knee leg amputation Left 1989  . Coronary artery bypass graft  2003    CABG X3" (12/10/2012)  . Cardiac catheterization      "once" (12/10/2012)  . Cataract extraction, bilateral Bilateral   . Below knee leg amputation Right 12/10/2012  . Amputation Right 12/10/2012    Procedure: AMPUTATION BELOW RIGHT KNEE;  Surgeon: Nadara Mustard, MD;  Location: [facility] OR;  Service: Orthopedics;  Laterality: Right;   Social History:   reports that she has never smoked. She has never used smokeless tobacco. She reports that she does not drink alcohol or use illicit drugs.  Family History  Problem Relation Age of Onset  . Cancer Neg Hx   . Heart disease Neg Hx     Medications: Patient's Medications  New Prescriptions   No medications on file  Previous Medications   ATENOLOL (TENORMIN) 25 MG  TABLET    Take 1 tablet (25 mg total) by mouth daily.   FEEDING SUPPLEMENT (ENSURE COMPLETE) LIQD    Take 237 mLs by mouth 2 (two) times daily between meals.   FERROUS SULFATE ([facility] IRON) 325 (65 FE) MG TABLET    Take 325 mg by mouth daily.     HYDROCODONE-ACETAMINOPHEN (NORCO/VICODIN) 5-325 MG PER TABLET    Take one tablet by mouth every 4 hours as needed for pain.   ISOSORBIDE MONONITRATE (ISMO,MONOKET) 10  MG TABLET    Take 10 mg by mouth daily.   LINACLOTIDE (LINZESS) 145 MCG CAPS    Take 145 mcg by mouth daily.   OMEPRAZOLE (PRILOSEC) 20 MG CAPSULE    Take 1 capsule (20 mg total) by mouth daily.   SIMVASTATIN (ZOCOR) 40 MG TABLET    Take 1 tablet (40 mg total) by mouth at bedtime.   TRAZODONE (DESYREL) 100 MG TABLET    Take 1 tablet (100 mg total) by mouth at bedtime.   WARFARIN (COUMADIN) 1 MG TABLET    Take 1.5 mg by mouth every evening.  Modified Medications   No medications on file  Discontinued Medications   No medications on file     Physical Exam: Filed Vitals:   12/16/12 1353  BP: 124/61  Pulse: 74  Temp: 97.1 F (36.2 C)  Resp: 17  Height: 5\' 6"  (1.676 m)  Weight: 185 lb (83.915 kg)  Physical Exam  Constitutional: She appears well-developed. No distress.  Obese black female, resting in bed  HENT:  Head: Normocephalic and atraumatic.  Right Ear: External ear normal.  Left Ear: External ear normal.  Nose: Nose normal.  Mouth/Throat: Oropharynx is clear and moist.  Eyes: Conjunctivae and EOM are normal. Pupils are equal, round, and reactive to light.  Neck: Normal range of motion. Neck supple. JVD present. No thyromegaly present.  Cardiovascular: Normal rate, regular rhythm and normal heart sounds.   Pulmonary/Chest: Effort normal. No respiratory distress. She has rales.  Abdominal: Soft. Bowel sounds are normal. She exhibits no distension. There is no tenderness.  Musculoskeletal: She exhibits edema.  3+ edema up to thighs  Lymphadenopathy:    She has no cervical adenopathy.  Skin: Skin is warm and dry.    Labs reviewed: Basic Metabolic Panel:  Recent Labs  16/10/96 0944 10/20/12 0450 10/21/12 0533 12/10/12 1008  NA 133* 136  --  129*  K 4.1 4.2  --  4.9  CL 103 106  --  98  CO2 18* 22  --  23  GLUCOSE 146* 147*  --  83  BUN 34* 27*  --  17  CREATININE 1.52* 1.27* 1.06 0.88  CALCIUM 8.3* 8.7  --  8.5   Liver Function Tests:  Recent Labs   10/16/12 2045  AST 14  ALT 6  ALKPHOS 69  BILITOT 0.8  PROT 6.4  ALBUMIN 2.5*  CBC:  Recent Labs  07/04/12 1643 10/16/12 2045 10/18/12 1048 10/20/12 0450 12/10/12 1008  WBC 7.2 9.2 7.7 6.1 6.7  NEUTROABS 4.9 6.5  --   --   --   HGB 11.3* 11.3* 9.8* 9.5* 10.1*  HCT 33.8* 33.3* 29.5* 28.5* 28.5*  MCV 90.9 88.8 89.9 88.8 89.6  PLT 174 203 218 222 233   Cardiac Enzymes:  Recent Labs  10/17/12 0043 10/17/12 0645 10/17/12 1140  TROPONINI <0.30 <0.30 <0.30   CBG:  Recent Labs  12/11/12 0630 12/11/12 1207 12/11/12 1817  GLUCAP 97 119* 184*  12/16/12:  BMP:  Na 129, BUN 25, cr 1.3, Ca 8.2 12/16/12:  CXR:  CHF, volume overload  Assessment/Plan 1. Osteomyelitis of ankle or foot, right, acute -is s/p transtibial amputation right and prior transtibial amputation left -F/u Dr. [name] in 2 wks from 10/3  2. Iron deficiency anemia, unspecified Continue iron supplement  3. DVT (deep venous thrombosis), right On coumadin therapy with INR goal 2-3, but INR cannot be drawn at present (has had multiple sticks w/o success here), previously was supratherapeutic    4. Coronary atherosclerosis of unspecified type of vessel, native or graft Continue secondary prevention with bp, lipid control  5. Hyperlipidemia LDL goal < 100 Continue statin therapy  6. Gait instability Here for therapy s/p hospital stay--working with pt, ot  7. Chronic idiopathic constipation Continue current bowel regimen  8. Generalized weakness Due to CHF, deconditioning Here for therapy  9.  Hyponatremia Na 129 on bmp, needs fluid repletion and requires diuresis at present--will need hospital admission for mgt of this due to need for frequent stat labs, telemetry and IV access, as well  Functional status:  Full code  Family/ staff Communication:   Labs/tests ordered: was for stat bmp and inr today, but INR could not be obtained (clotted immediately)  PT WAS SENT BACK TO ED FOR CHF, HYPONATREMIA,  UNOBTAINABLE INR

## 2012-12-16 NOTE — ED Notes (Signed)
Pt turned on to right side to get off of her buttocks. Pt complaining of pain saying it is the worse she has ever had. RN Wyman Songster was informed.

## 2012-12-17 ENCOUNTER — Non-Acute Institutional Stay (SKILLED_NURSING_FACILITY): Payer: Medicare Other | Admitting: Adult Health

## 2012-12-17 DIAGNOSIS — I82401 Acute embolism and thrombosis of unspecified deep veins of right lower extremity: Secondary | ICD-10-CM

## 2012-12-17 DIAGNOSIS — R609 Edema, unspecified: Secondary | ICD-10-CM

## 2012-12-17 DIAGNOSIS — Z7901 Long term (current) use of anticoagulants: Secondary | ICD-10-CM

## 2012-12-17 DIAGNOSIS — I82409 Acute embolism and thrombosis of unspecified deep veins of unspecified lower extremity: Secondary | ICD-10-CM

## 2012-12-24 ENCOUNTER — Non-Acute Institutional Stay (SKILLED_NURSING_FACILITY): Payer: Medicare Other | Admitting: Adult Health

## 2012-12-24 DIAGNOSIS — J189 Pneumonia, unspecified organism: Secondary | ICD-10-CM

## 2012-12-24 DIAGNOSIS — I82402 Acute embolism and thrombosis of unspecified deep veins of left lower extremity: Secondary | ICD-10-CM

## 2012-12-24 DIAGNOSIS — I82409 Acute embolism and thrombosis of unspecified deep veins of unspecified lower extremity: Secondary | ICD-10-CM

## 2012-12-24 DIAGNOSIS — R197 Diarrhea, unspecified: Secondary | ICD-10-CM

## 2012-12-24 DIAGNOSIS — Z7901 Long term (current) use of anticoagulants: Secondary | ICD-10-CM

## 2012-12-25 ENCOUNTER — Non-Acute Institutional Stay (SKILLED_NURSING_FACILITY): Payer: Medicare Other | Admitting: Internal Medicine

## 2012-12-25 DIAGNOSIS — Z7901 Long term (current) use of anticoagulants: Secondary | ICD-10-CM

## 2012-12-25 DIAGNOSIS — I82409 Acute embolism and thrombosis of unspecified deep veins of unspecified lower extremity: Secondary | ICD-10-CM

## 2012-12-25 NOTE — Progress Notes (Signed)
Patient ID: [name], female   DOB: Sep 05, 1926, 79 y.o.   MRN: [id number]  Angelica Lawrence living [place]  Chief Complaint  Patient presents with  . Acute Visit    coumadin management   No Known Allergies  HPI- 79 y/o female patient is here for STR and is on coumadin due to history of DVT. Her inr today is 2.01 and she is on 1.5 mg of coumadin. Last inr 12/24/12 was 2.01. She recently had left leg amputation  Review of Systems  Constitutional: Negative for malaise/fatigue.  Respiratory: Negative for cough and shortness of breath.   Cardiovascular: Negative for chest pain and palpitations.  Gastrointestinal: Negative for heartburn and abdominal pain.  Musculoskeletal: Negative for myalgias and joint pain.  Psychiatric/Behavioral: Negative for depression.   Past Medical History  Diagnosis Date  . ALLERGIC RHINITIS 12/30/2006  . ANEMIA, IRON DEFICIENCY 10/21/2009  . COLONIC POLYPS, HX OF 12/30/2006  . CORONARY ARTERY DISEASE 12/30/2006  . DIVERTICULOSIS, COLON 12/30/2006  . HYPERLIPIDEMIA 12/30/2006  . HYPERTENSION 12/30/2006  . PERIPHERAL NEUROPATHY 12/30/2006  . PERIPHERAL VASCULAR DISEASE 12/30/2006  . GOITER 12/30/2006  . VITAMIN B12 DEFICIENCY 09/16/2008  . Carpal tunnel syndrome 12/30/2006  . HEMORRHOIDS 12/30/2006  . DEGENERATIVE JOINT DISEASE 02/10/2007  . INSOMNIA 10/21/2009  . CORNEAL TRANSPLANT 12/30/2006    family denies this hx on 12/10/2012  . DVT (deep venous thrombosis) 10/2012    "left leg" (12/10/2012)  . Type II diabetes mellitus   . Stroke 1970's?    denies residual on 12/10/2012    Physical Exam   Reviewed vital signs  Constitutional: She appears well-developed and well-nourished.  Neck: Neck supple. No JVD present.  Cardiovascular: Normal rate and regular rhythm.   Respiratory: Breath sounds normal. No respiratory distress. She has no wheezes.  GI: Soft. Bowel sounds are normal. She exhibits no distension. There is no tenderness.  Musculoskeletal: She exhibits  no edema.  Neurological: She is alert.  Skin: Skin is warm and dry.    Assessment/plan  DVT- stable. Tolerating coumadin well. Continue coumadin for now with goal inr 2-3  Long term anticoagulation- continue coumadin at 1.5 mg daily for now and check inr 12/29/12. Goal inr 2-3.

## 2012-12-29 ENCOUNTER — Encounter: Payer: Self-pay | Admitting: Adult Health

## 2012-12-29 NOTE — Progress Notes (Signed)
Patient ID: Angelica Lawrence, female   DOB: Jun 03, 1926, 79 y.o.   MRN: [id number]  [facility]  No Known Allergies   Chief Complaint  Patient presents with  . Acute Visit    follow up chest x-ray and diarrhea    HPI: She is currently being treated for pneumonia with augmentin  875 mg twice daily for 10 days from 12-22-12.  She states that she is feeling bad all over. She is having frequent diarrhea stools more than 5 per day. She is on medications for chronic constipation which have not been held by the nursing staff. She is on chronic coumadin therapy for her dvt with an inr of 2.10 and is taking coumadin 1.5 mg daily. She states her appetite is poor.   Past Medical History  Diagnosis Date  . ALLERGIC RHINITIS 12/30/2006  . ANEMIA, IRON DEFICIENCY 10/21/2009  . COLONIC POLYPS, HX OF 12/30/2006  . CORONARY ARTERY DISEASE 12/30/2006  . DIVERTICULOSIS, COLON 12/30/2006  . HYPERLIPIDEMIA 12/30/2006  . HYPERTENSION 12/30/2006  . PERIPHERAL NEUROPATHY 12/30/2006  . PERIPHERAL VASCULAR DISEASE 12/30/2006  . GOITER 12/30/2006  . VITAMIN B12 DEFICIENCY 09/16/2008  . Carpal tunnel syndrome 12/30/2006  . HEMORRHOIDS 12/30/2006  . DEGENERATIVE JOINT DISEASE 02/10/2007  . INSOMNIA 10/21/2009  . CORNEAL TRANSPLANT 12/30/2006    family denies this hx on 12/10/2012  . DVT (deep venous thrombosis) 10/2012    "left leg" (12/10/2012)  . Type II diabetes mellitus   . Stroke 1970's?    denies residual on 12/10/2012    Past Surgical History  Procedure Laterality Date  . Appendectomy    . Cholecystectomy    . Abdominal hysterectomy  1960's    and bso  . Below knee leg amputation Left 1989  . Coronary artery bypass graft  2003    CABG X3" (12/10/2012)  . Cardiac catheterization      "once" (12/10/2012)  . Cataract extraction, bilateral Bilateral   . Below knee leg amputation Right 12/10/2012  . Amputation Right 12/10/2012    Procedure: AMPUTATION BELOW RIGHT KNEE;  Surgeon: Nadara Mustard, MD;   Location: [facility] OR;  Service: Orthopedics;  Laterality: Right;    VITAL SIGNS BP 118/60  Pulse 78  Ht 5\' 1"  (1.549 m)  Wt 163 lb (73.936 kg)  BMI 30.81 kg/m2   Patient's Medications  New Prescriptions   No medications on file  Previous Medications   ATENOLOL (TENORMIN) 25 MG TABLET    Take 1 tablet (25 mg total) by mouth daily.   FERROUS SULFATE ([employer] IRON) 325 (65 FE) MG TABLET    Take 325 mg by mouth daily.     HYDROCODONE-ACETAMINOPHEN (NORCO/VICODIN) 5-325 MG PER TABLET    Take 1 tablet by mouth every 4 (four) hours as needed for pain.   ISOSORBIDE MONONITRATE (ISMO,MONOKET) 10 MG TABLET    Take 10 mg by mouth daily.   LINACLOTIDE (LINZESS) 145 MCG CAPS    Take 145 mcg by mouth daily.   MULTIPLE VITAMIN (MULTIVITAMIN WITH MINERALS) TABS TABLET    Take 1 tablet by mouth daily.    OMEPRAZOLE (PRILOSEC) 20 MG CAPSULE    Take 1 capsule (20 mg total) by mouth daily.   POLYETHYLENE GLYCOL (MIRALAX / GLYCOLAX) PACKET    Take 17 g by mouth daily.   SIMVASTATIN (ZOCOR) 40 MG TABLET    Take 1 tablet (40 mg total) by mouth at bedtime.   TRAZODONE (DESYREL) 100 MG TABLET    Take 1 tablet (100  mg total) by mouth at bedtime.   WARFARIN (COUMADIN) 1 MG TABLET    Take 1.5 mg by mouth every evening.  Modified Medications   Modified Medication Previous Medication   FUROSEMIDE (LASIX) 40 MG TABLET furosemide (LASIX) 40 MG tablet      40 mg daily. Take 1 po BID x 3 days then once a day    Take 1 po BID x 3 days then once a day  Discontinued Medications   FUROSEMIDE (LASIX) 40 MG TABLET    Take 40 mg by mouth once.   POTASSIUM CHLORIDE SA (K-DUR,KLOR-CON) 20 MEQ TABLET    Take 40 mEq by mouth once.   TUBERCULIN (APLISOL) 5 UNIT/0.1ML INJECTION    Inject 5 Units into the skin 2 (two) times a week. On Sunday and Friday. Stop on 12/14/12    SIGNIFICANT DIAGNOSTIC EXAMS  12-16-12: chest x-ray: moderate chf 12-22-12: chest x-ray; bilateral infiltrates and small to moderate pleural effusions.  12-18-12:  chest x-ray: worsening chf; chronic interstitial lung changes superimposed bibasilar airspace disease  LABS REVIEWED:   12-16-12: wbc 7.7;hgb 99.; hct 28.7; mcv 90.8; plt 270; glucose 124; bun 25; creat 0.96; k+5.1; na++133; liver normal albumin 1.7 BNP 18,490.0 12-24-12: glcuose 129; bun 16; creat 0.80; k+3.5; na++134; INR 2.10 (1.5 mg coumadin)   Review of Systems  Constitutional: Positive for malaise/fatigue.  Respiratory: Positive for cough and shortness of breath.   Cardiovascular: Negative for chest pain and palpitations.  Gastrointestinal: Positive for abdominal pain and diarrhea. Negative for heartburn.  Musculoskeletal: Negative for myalgias.  Skin: Negative.   Neurological: Positive for weakness. Negative for headaches.  Psychiatric/Behavioral: Negative for depression. The patient does not have insomnia.     Physical Exam  Constitutional: She appears well-developed and well-nourished. No distress.  Neck: No JVD present.  Cardiovascular: Normal rate, regular rhythm and intact distal pulses.   Respiratory: Effort normal. No respiratory distress. She has no wheezes. She has no rales.  Has bilateral diminished sounds present.   GI: Soft. Bowel sounds are normal. She exhibits no distension. There is tenderness.  Has mild generalized tenderness present.   Musculoskeletal: She exhibits no edema.  Neurological: She is alert.  Skin: Skin is warm and dry.       ASSESSMENT/ PLAN:  1. Pneumonia: will continue her augmentin at this time; will continue to monitor her response and will continue to monitor her status.     2. Diarrhea: will hold her linzess and miralax while she is on her abt and will continue her probiotic as well will continue to monitor for further stools if she continues will make further changes as indicated and will monitor her status.   3. Dvt: will continue her coumadin at 1.5 mg daily and will check inr on 12-26-12 and will monitor

## 2013-01-06 ENCOUNTER — Non-Acute Institutional Stay (SKILLED_NURSING_FACILITY): Payer: Medicare Other | Admitting: Internal Medicine

## 2013-01-06 ENCOUNTER — Encounter: Payer: Self-pay | Admitting: Internal Medicine

## 2013-01-06 DIAGNOSIS — M86179 Other acute osteomyelitis, unspecified ankle and foot: Secondary | ICD-10-CM

## 2013-01-06 DIAGNOSIS — J189 Pneumonia, unspecified organism: Secondary | ICD-10-CM

## 2013-01-06 DIAGNOSIS — I2510 Atherosclerotic heart disease of native coronary artery without angina pectoris: Secondary | ICD-10-CM

## 2013-01-06 DIAGNOSIS — I82409 Acute embolism and thrombosis of unspecified deep veins of unspecified lower extremity: Secondary | ICD-10-CM

## 2013-01-06 DIAGNOSIS — M86171 Other acute osteomyelitis, right ankle and foot: Secondary | ICD-10-CM

## 2013-01-06 DIAGNOSIS — I82401 Acute embolism and thrombosis of unspecified deep veins of right lower extremity: Secondary | ICD-10-CM

## 2013-01-06 DIAGNOSIS — D509 Iron deficiency anemia, unspecified: Secondary | ICD-10-CM

## 2013-01-06 DIAGNOSIS — Z7901 Long term (current) use of anticoagulants: Secondary | ICD-10-CM

## 2013-01-06 NOTE — Progress Notes (Signed)
Patient ID: Angelica Lawrence, female   DOB: 1926-11-11, 79 y.o.   MRN: [id number] Location:  [facility] L. Renato Gails, D.O., C.M.D.  PCP: [name], MD  Code Status: full code   No Known Allergies  Chief Complaint  Patient presents with  . Discharge Note    HPI:  79 yo black female with h/o PAD, CAD, colon polyps, iron deficiency anemia, B12 deficiency, DMII with circulatory complications, and stroke w/o residual focal deficits was here for rehab s/p hospitalization for planned right transtibial amputation due to right lateral malleolar osteomyelitis with abscess.  When she first returned she had some volume overload that resolved with temporary increase in her diuretic.  She has recovered well and participated in PT and OT.  Her wound was managed by the treatment nurse and Dr. Lorenz Coaster from wound care.    When seen today, she said she felt well.  She has been eager to go home since arrival  Review of Systems:  Review of Systems  Constitutional: Negative for fever, chills and malaise/fatigue.  Eyes: Negative for blurred vision.  Respiratory: Negative for shortness of breath.   Cardiovascular: Negative for chest pain.  Gastrointestinal: Negative for abdominal pain.  Musculoskeletal: Negative for falls.  Skin: Negative for rash.  Neurological: Negative for dizziness and headaches.  Psychiatric/Behavioral: Negative for depression.     Past Medical History  Diagnosis Date  . ALLERGIC RHINITIS 12/30/2006  . ANEMIA, IRON DEFICIENCY 10/21/2009  . COLONIC POLYPS, HX OF 12/30/2006  . CORONARY ARTERY DISEASE 12/30/2006  . DIVERTICULOSIS, COLON 12/30/2006  . HYPERLIPIDEMIA 12/30/2006  . HYPERTENSION 12/30/2006  . PERIPHERAL NEUROPATHY 12/30/2006  . PERIPHERAL VASCULAR DISEASE 12/30/2006  . GOITER 12/30/2006  . VITAMIN B12 DEFICIENCY 09/16/2008  . Carpal tunnel syndrome 12/30/2006  . HEMORRHOIDS 12/30/2006  . DEGENERATIVE JOINT DISEASE 02/10/2007  . INSOMNIA  10/21/2009  . CORNEAL TRANSPLANT 12/30/2006    family denies this hx on 12/10/2012  . DVT (deep venous thrombosis) 10/2012    "left leg" (12/10/2012)  . Type II diabetes mellitus   . Stroke 1970's?    denies residual on 12/10/2012    Past Surgical History  Procedure Laterality Date  . Appendectomy    . Cholecystectomy    . Abdominal hysterectomy  1960's    and bso  . Below knee leg amputation Left 1989  . Coronary artery bypass graft  2003    CABG X3" (12/10/2012)  . Cardiac catheterization      "once" (12/10/2012)  . Cataract extraction, bilateral Bilateral   . Below knee leg amputation Right 12/10/2012  . Amputation Right 12/10/2012    Procedure: AMPUTATION BELOW RIGHT KNEE;  Surgeon: Nadara Mustard, MD;  Location: [facility] OR;  Service: Orthopedics;  Laterality: Right;    Social History:   reports that she has never smoked. She has never used smokeless tobacco. She reports that she does not drink alcohol or use illicit drugs.  Family History  Problem Relation Age of Onset  . Cancer Neg Hx   . Heart disease Neg Hx     Medications: Patient's Medications  New Prescriptions   No medications on file  Previous Medications   ATENOLOL (TENORMIN) 25 MG TABLET    Take 1 tablet (25 mg total) by mouth daily.   FERROUS SULFATE ([employer] IRON) 325 (65 FE) MG TABLET    Take 325 mg by mouth daily.     FUROSEMIDE (LASIX) 40 MG TABLET    40 mg daily. Take 1  po BID x 3 days then once a day   HYDROCODONE-ACETAMINOPHEN (NORCO/VICODIN) 5-325 MG PER TABLET    Take 1 tablet by mouth every 4 (four) hours as needed for pain.   ISOSORBIDE MONONITRATE (ISMO,MONOKET) 10 MG TABLET    Take 10 mg by mouth daily.   LINACLOTIDE (LINZESS) 145 MCG CAPS    Take 145 mcg by mouth daily.   MULTIPLE VITAMIN (MULTIVITAMIN WITH MINERALS) TABS TABLET    Take 1 tablet by mouth daily.    OMEPRAZOLE (PRILOSEC) 20 MG CAPSULE    Take 1 capsule (20 mg total) by mouth daily.   POLYETHYLENE GLYCOL (MIRALAX / GLYCOLAX) PACKET    Take 17  g by mouth daily.   SIMVASTATIN (ZOCOR) 40 MG TABLET    Take 1 tablet (40 mg total) by mouth at bedtime.   TRAZODONE (DESYREL) 100 MG TABLET    Take 1 tablet (100 mg total) by mouth at bedtime.   WARFARIN (COUMADIN) 1 MG TABLET    Take 1.5 mg by mouth every evening.  Modified Medications   No medications on file  Discontinued Medications   No medications on file    Physical Exam: Filed Vitals:   01/06/13 1605  BP: 95/58  Pulse: 80  Temp: 98 F (36.7 C)  Resp: 20  Height: 5\' 1"  (1.549 m)  Weight: 160 lb (72.576 kg)  SpO2: 96%   Physical Exam  Constitutional: She is oriented to person, place, and time. She appears well-developed and well-nourished. No distress.  Obese black female  HENT:  Head: Normocephalic and atraumatic.  Cardiovascular: Normal rate and regular rhythm.   Pulmonary/Chest: Effort normal and breath sounds normal. No respiratory distress. She has no rales.  Abdominal: Soft. Bowel sounds are normal. She exhibits no distension. There is no tenderness.  Musculoskeletal: She exhibits edema.  Nonpitting  Neurological: She is alert and oriented to person, place, and time.  Skin:  Amputation site w/o drainage, healing well  Psychiatric: She has a normal mood and affect.    Labs reviewed: Basic Metabolic Panel:  Recent Labs  07/37/10 0450 10/21/12 0533 12/10/12 1008 12/16/12 2006  NA 136  --  129* 133*  K 4.2  --  4.9 5.1  CL 106  --  98 100  CO2 22  --  23 21  GLUCOSE 147*  --  83 124*  BUN 27*  --  17 25*  CREATININE 1.27* 1.06 0.88 0.96  CALCIUM 8.7  --  8.5 8.1*   Liver Function Tests:  Recent Labs  10/16/12 2045 12/16/12 2006  AST 14 31  ALT 6 8  ALKPHOS 69 110  BILITOT 0.8 0.4  PROT 6.4 5.8*  ALBUMIN 2.5* 1.7*  CBC:  Recent Labs  07/04/12 1643 10/16/12 2045  10/20/12 0450 12/10/12 1008 12/16/12 1916  WBC 7.2 9.2  < > 6.1 6.7 7.7  NEUTROABS 4.9 6.5  --   --   --  5.4  HGB 11.3* 11.3*  < > 9.5* 10.1* 9.9*  HCT 33.8* 33.3*  < >  28.5* 28.5* 28.7*  MCV 90.9 88.8  < > 88.8 89.6 90.8  PLT 174 203  < > 222 233 270  < > = values in this interval not displayed. Cardiac Enzymes:  Recent Labs  10/17/12 0043 10/17/12 0645 10/17/12 1140  TROPONINI <0.30 <0.30 <0.30   CBG:  Recent Labs  12/11/12 0630 12/11/12 1207 12/11/12 1817  GLUCAP 97 119* 184*   Assessment/Plan:   1. Osteomyelitis of ankle  or foot, right, acute -s/p right transtibial amputation--healing well -has completed therapy  2. DVT (deep venous thrombosis), right -on coumadin with INR goal 2-3 -has INR check in AM and next INR will be determined based on that--to be done by home health and faxed to PCP  3. Long term (current) use of anticoagulants -for DVT, on coumadin with INR goal 2-3  4. Iron deficiency anemia, unspecified -continue iron supplementation--hgb has been improving s/p acute blood loss anemia from surgery  5. Coronary atherosclerosis of unspecified type of vessel, native or graft -stable, did have volume overload s/p her surgery--resolved with diuresis  6. Pneumonia -was treated during stay and has since resolved  Patient is being discharged with home health services:  PT, OT, RN for coumadin mgt.  Gets another INR in the am 10/29.    Patient is being discharged with the following durable medical equipment:  Hospital bed, trapeze bar, standard wheelchair, hoyer lift due to being a double amputee and needs bed positioned in way not feasible with regular bed.  Requires wheelchair to safely perform ADLs.  Caregiver will provide assistance  Patient has been advised to f/u with their PCP in 1-2 weeks to bring them up to date on their rehab stay.  Dr. Pecola Leisure 11/11 at 3:30pm.  They were provided with a 30 day supply of scripts for prescription medications and refills must be obtained from their PCP.  She is also to follow up with Dr. Dietrich Pates from Cardiology on 11/14 and Dr. [name] on 11/13.    Labs/tests ordered:  INR  monitoring--home health to fax results to her PCP, Dr. Pecola Leisure

## 2013-01-12 NOTE — Progress Notes (Signed)
Patient ID: Angelica Lawrence, female   DOB: 08-24-1926, 79 y.o.   MRN: [id number]  [facility]  No Known Allergies  Chief Complaint  Patient presents with  . Acute Visit    follow up er visit     HPI She has been seen in the er for her chf; she was placed on increased lasix.  Her inr today is 2.26 and she is taking coumadin 1.5 mg daily. She is not voicing complaints today. Nursing staff is not voicing concerns today.   Past Medical History  Diagnosis Date  . ALLERGIC RHINITIS 12/30/2006  . ANEMIA, IRON DEFICIENCY 10/21/2009  . COLONIC POLYPS, HX OF 12/30/2006  . CORONARY ARTERY DISEASE 12/30/2006  . DIVERTICULOSIS, COLON 12/30/2006  . HYPERLIPIDEMIA 12/30/2006  . HYPERTENSION 12/30/2006  . PERIPHERAL NEUROPATHY 12/30/2006  . PERIPHERAL VASCULAR DISEASE 12/30/2006  . GOITER 12/30/2006  . VITAMIN B12 DEFICIENCY 09/16/2008  . Carpal tunnel syndrome 12/30/2006  . HEMORRHOIDS 12/30/2006  . DEGENERATIVE JOINT DISEASE 02/10/2007  . INSOMNIA 10/21/2009  . CORNEAL TRANSPLANT 12/30/2006    family denies this hx on 12/10/2012  . DVT (deep venous thrombosis) 10/2012    "left leg" (12/10/2012)  . Type II diabetes mellitus   . Stroke 1970's?    denies residual on 12/10/2012   Past Surgical History  Procedure Laterality Date  . Appendectomy    . Cholecystectomy    . Abdominal hysterectomy  1960's    and bso  . Below knee leg amputation Left 1989  . Coronary artery bypass graft  2003    CABG X3" (12/10/2012)  . Cardiac catheterization      "once" (12/10/2012)  . Cataract extraction, bilateral Bilateral   . Below knee leg amputation Right 12/10/2012  . Amputation Right 12/10/2012    Procedure: AMPUTATION BELOW RIGHT KNEE;  Surgeon: Nadara Mustard, MD;  Location: [facility] OR;  Service: Orthopedics;  Laterality: Right;     MEDICATIONS  Atenolol 25 mg daily mvi daily Iron 325 mg daily Isordil 10 mg daily linzess 145 mcg daily miralax 17 gm daily vicodin 5/325 mg twice daily and every 6 hours  as needed for pain prilosec 20 mg daily zocor 40 mg daily Trazodone 100 mg nightly Coumadin 1.5 mg daily Lasix 40 mg twice daily for 3 days then daily  SIGNIFICANT TESTS  12-16-12; chest x-ray: moderate chf; with mild cardiomegaly  LABS REVIEWED:   10-31-12: glucose 133; bun 25; creat 1.16; k+5.1; na++ 132 11-07-12: wbc 6.4; hgb 7.5; hct 22.5 ;mcv 90.4 plt 213     Review of Systems  Constitutional: Negative for malaise/fatigue.  Respiratory: Negative for cough and shortness of breath.   Cardiovascular: Negative for chest pain and palpitations.  Gastrointestinal: Negative for heartburn and abdominal pain.  Musculoskeletal: Negative for myalgias and joint pain.  Skin:       Ulceration on right lower leg is awaiting an amputation   Neurological: Negative for headaches.  Psychiatric/Behavioral: Negative for depression.    Physical Exam  Constitutional: She appears well-developed and well-nourished.  Neck: Neck supple. No JVD present.  Cardiovascular: Normal rate and regular rhythm.   No pp on right lower extremity   Respiratory: Breath sounds normal. No respiratory distress. She has no wheezes.  GI: Soft. Bowel sounds are normal. She exhibits no distension. There is no tenderness.  Musculoskeletal: She exhibits no edema.  Has left bka; is awaiting right bka   Neurological: She is alert.  Skin: Skin is warm and dry.  Dressing to right  ankle intact      ASSESSMENT/PLAN  1. Dvt: will continue coumadin 1.5 mg daily and will check inr in one week.   2. Edema: will continue her lasix; will repeat chest x-ray in the am and will check a bmp and bnp will remove her foley when her lasix is daily. If her chest x-ray demonstrates  chf and her bnp is elevated will need to consider further work up at that time. Will monitor her status

## 2013-01-20 ENCOUNTER — Emergency Department (HOSPITAL_COMMUNITY)
Admission: EM | Admit: 2013-01-20 | Discharge: 2013-01-20 | Disposition: A | Discharge status: Home / Self Care | Payer: Medicare Other | Attending: Emergency Medicine | Admitting: Emergency Medicine

## 2013-01-20 ENCOUNTER — Encounter (HOSPITAL_COMMUNITY): Payer: Self-pay | Admitting: Emergency Medicine

## 2013-01-20 DIAGNOSIS — Z5189 Encounter for other specified aftercare: Secondary | ICD-10-CM

## 2013-01-20 DIAGNOSIS — Z8669 Personal history of other diseases of the nervous system and sense organs: Secondary | ICD-10-CM | POA: Insufficient documentation

## 2013-01-20 DIAGNOSIS — D509 Iron deficiency anemia, unspecified: Secondary | ICD-10-CM | POA: Insufficient documentation

## 2013-01-20 DIAGNOSIS — L0390 Cellulitis, unspecified: Secondary | ICD-10-CM

## 2013-01-20 DIAGNOSIS — I2510 Atherosclerotic heart disease of native coronary artery without angina pectoris: Secondary | ICD-10-CM | POA: Insufficient documentation

## 2013-01-20 DIAGNOSIS — E119 Type 2 diabetes mellitus without complications: Secondary | ICD-10-CM | POA: Insufficient documentation

## 2013-01-20 DIAGNOSIS — I10 Essential (primary) hypertension: Secondary | ICD-10-CM | POA: Insufficient documentation

## 2013-01-20 DIAGNOSIS — Z860100 Personal history of colon polyps, unspecified: Secondary | ICD-10-CM | POA: Insufficient documentation

## 2013-01-20 DIAGNOSIS — Y849 Medical procedure, unspecified as the cause of abnormal reaction of the patient, or of later complication, without mention of misadventure at the time of the procedure: Secondary | ICD-10-CM | POA: Insufficient documentation

## 2013-01-20 DIAGNOSIS — Z86718 Personal history of other venous thrombosis and embolism: Secondary | ICD-10-CM | POA: Insufficient documentation

## 2013-01-20 DIAGNOSIS — Z8673 Personal history of transient ischemic attack (TIA), and cerebral infarction without residual deficits: Secondary | ICD-10-CM | POA: Insufficient documentation

## 2013-01-20 DIAGNOSIS — T8740 Infection of amputation stump, unspecified extremity: Secondary | ICD-10-CM | POA: Insufficient documentation

## 2013-01-20 DIAGNOSIS — L02419 Cutaneous abscess of limb, unspecified: Secondary | ICD-10-CM | POA: Insufficient documentation

## 2013-01-20 DIAGNOSIS — E785 Hyperlipidemia, unspecified: Secondary | ICD-10-CM | POA: Insufficient documentation

## 2013-01-20 DIAGNOSIS — Z7901 Long term (current) use of anticoagulants: Secondary | ICD-10-CM | POA: Insufficient documentation

## 2013-01-20 DIAGNOSIS — Z8719 Personal history of other diseases of the digestive system: Secondary | ICD-10-CM | POA: Insufficient documentation

## 2013-01-20 DIAGNOSIS — Z8709 Personal history of other diseases of the respiratory system: Secondary | ICD-10-CM | POA: Insufficient documentation

## 2013-01-20 DIAGNOSIS — Z8739 Personal history of other diseases of the musculoskeletal system and connective tissue: Secondary | ICD-10-CM | POA: Insufficient documentation

## 2013-01-20 DIAGNOSIS — Z79899 Other long term (current) drug therapy: Secondary | ICD-10-CM | POA: Insufficient documentation

## 2013-01-20 LAB — PROTIME-INR
INR: 3.99 — ABNORMAL HIGH (ref 0.00–1.49)
Prothrombin Time: 37.4 seconds — ABNORMAL HIGH (ref 11.6–15.2)

## 2013-01-20 MED ORDER — CEPHALEXIN 250 MG PO CAPS
500.0000 mg | ORAL_CAPSULE | Freq: Once | ORAL | Status: AC
  Administered 2013-01-20: 500 mg via ORAL
  Filled 2013-01-20: qty 2

## 2013-01-20 MED ORDER — TRAMADOL HCL 50 MG PO TABS: 50.0000 mg | ORAL_TABLET | Freq: Four times a day (QID) | ORAL | Status: DC | PRN

## 2013-01-20 MED ORDER — OXYCODONE-ACETAMINOPHEN 5-325 MG PO TABS
2.0000 | ORAL_TABLET | Freq: Once | ORAL | Status: AC
  Administered 2013-01-20: 2 via ORAL
  Filled 2013-01-20: qty 2

## 2013-01-20 MED ORDER — CEPHALEXIN 500 MG PO CAPS: 500.0000 mg | ORAL_CAPSULE | Freq: Four times a day (QID) | ORAL | Status: DC

## 2013-01-20 NOTE — ED Notes (Signed)
Right stump is red around incision line and there is purulent drainage at the incision points. Right stump is warmer than the left stump.

## 2013-01-20 NOTE — ED Provider Notes (Signed)
CSN: [id number]     Arrival date & time 01/20/13  1105 History   First MD Initiated Contact with Patient 01/20/13 1144     Chief Complaint  Patient presents with  . Leg Pain    (stump)   (Consider location/radiation/quality/duration/timing/severity/associated sxs/prior Treatment) HPI  79yF with RLE pain. S/p recent R BKA. Increased pain at stump in past day, but currently w/o pain. Denies trauma. Has home health care and reports nurse told her that it may be infected. No fever or chills. No n/v or increased fatigue. Mentation at baseline per family.   Past Medical History  Diagnosis Date  . ALLERGIC RHINITIS 12/30/2006  . ANEMIA, IRON DEFICIENCY 10/21/2009  . COLONIC POLYPS, HX OF 12/30/2006  . CORONARY ARTERY DISEASE 12/30/2006  . DIVERTICULOSIS, COLON 12/30/2006  . HYPERLIPIDEMIA 12/30/2006  . HYPERTENSION 12/30/2006  . PERIPHERAL NEUROPATHY 12/30/2006  . PERIPHERAL VASCULAR DISEASE 12/30/2006  . GOITER 12/30/2006  . VITAMIN B12 DEFICIENCY 09/16/2008  . Carpal tunnel syndrome 12/30/2006  . HEMORRHOIDS 12/30/2006  . DEGENERATIVE JOINT DISEASE 02/10/2007  . INSOMNIA 10/21/2009  . CORNEAL TRANSPLANT 12/30/2006    family denies this hx on 12/10/2012  . DVT (deep venous thrombosis) 10/2012    "left leg" (12/10/2012)  . Type II diabetes mellitus   . Stroke 1970's?    denies residual on 12/10/2012   Past Surgical History  Procedure Laterality Date  . Appendectomy    . Cholecystectomy    . Abdominal hysterectomy  1960's    and bso  . Below knee leg amputation Left 1989  . Coronary artery bypass graft  2003    CABG X3" (12/10/2012)  . Cardiac catheterization      "once" (12/10/2012)  . Cataract extraction, bilateral Bilateral   . Below knee leg amputation Right 12/10/2012  . Amputation Right 12/10/2012    Procedure: AMPUTATION BELOW RIGHT KNEE;  Surgeon: Nadara Mustard, MD;  Location: [facility] OR;  Service: Orthopedics;  Laterality: Right;   Family History  Problem Relation Age of Onset   . Cancer Neg Hx   . Heart disease Neg Hx    History  Substance Use Topics  . Smoking status: Never Smoker   . Smokeless tobacco: Never Used  . Alcohol Use: No   OB History   Grav Para Term Preterm Abortions TAB SAB Ect Mult Living                 Review of Systems  All systems reviewed and negative, other than as noted in HPI.   Allergies  Review of patient's allergies indicates no known allergies.  Home Medications   Current Outpatient Rx  Name  Route  Sig  Dispense  Refill  . atenolol (TENORMIN) 25 MG tablet   Oral   Take 1 tablet (25 mg total) by mouth daily.   30 tablet   0   . ferrous sulfate ([facility] IRON) 325 (65 FE) MG tablet   Oral   Take 325 mg by mouth daily.           . furosemide (LASIX) 40 MG tablet      40 mg daily. Take 1 po BID x 3 days then once a day         . HYDROcodone-acetaminophen (NORCO/VICODIN) 5-325 MG per tablet   Oral   Take 1 tablet by mouth every 4 (four) hours as needed for pain.         . isosorbide mononitrate (ISMO,MONOKET) 10 MG tablet   Oral  Take 10 mg by mouth daily.         . Linaclotide (LINZESS) 145 MCG CAPS   Oral   Take 145 mcg by mouth daily.         . Multiple Vitamin (MULTIVITAMIN WITH MINERALS) TABS tablet   Oral   Take 1 tablet by mouth daily.          Marland Kitchen omeprazole (PRILOSEC) 20 MG capsule   Oral   Take 1 capsule (20 mg total) by mouth daily.   30 capsule   0   . polyethylene glycol (MIRALAX / GLYCOLAX) packet   Oral   Take 17 g by mouth daily.         . simvastatin (ZOCOR) 40 MG tablet   Oral   Take 1 tablet (40 mg total) by mouth at bedtime.   30 tablet   0   . traZODone (DESYREL) 100 MG tablet   Oral   Take 1 tablet (100 mg total) by mouth at bedtime.   30 tablet   3   . warfarin (COUMADIN) 1 MG tablet   Oral   Take 1.5 mg by mouth every evening.          BP 101/41  Pulse 77  Temp(Src) 99.4 F (37.4 C) (Oral)  Ht 5\' 7"  (1.702 m)  Wt 164 lb (74.39 kg)  BMI 25.68  kg/m2  SpO2 98% Physical Exam  Nursing note and vitals reviewed. Constitutional: She appears well-developed and well-nourished. No distress.  HENT:  Head: Normocephalic and atraumatic.  Eyes: Conjunctivae are normal. Right eye exhibits no discharge. Left eye exhibits no discharge.  Neck: Neck supple.  Cardiovascular: Normal rate, regular rhythm and normal heart sounds.  Exam reveals no gallop and no friction rub.   No murmur heard. Pulmonary/Chest: Effort normal and breath sounds normal. No respiratory distress.  Abdominal: Soft. She exhibits no distension. There is no tenderness.  Musculoskeletal: She exhibits no edema and no tenderness.  Neurological: She is alert.  Skin: Skin is warm and dry.  L BKA stump looks healthy. R BKA stump with heavy exudate and scab along the length of the incision site. Mild erythema extending 1-1.5cm from the medial aspect of the wound with mild tenderness. No fluctuance/drainage.   Psychiatric: She has a normal mood and affect. Her behavior is normal. Thought content normal.    ED Course  Procedures (including critical care time) Labs Review Labs Reviewed - No data to display Imaging Review No results found.  EKG Interpretation   None       MDM   1. Visit for wound check   2. Cellulitis     79 year old female presenting for evaluation of lower extremity stump after recent right BKA. Wound has a very significant amount of exudate/scab along the wound margins which is impairing healing. There is mild erythema along the medial aspect. No fluctuance. Whitish/yellow exudate, but no purulent drainage. Minimal tenderness. I think pt primarily needs a little better wound care at this point, although cannot exclude possible early cellulitis along the medial aspect of the wound. Will give course of abx. Discussed with pt and daughter how to do wet-to-dry dressing and additional supplies provided. I think it's reasonable to start here and if does not show  improvement then may benefit from more advanced dressings/wound management. INR supratherapeutic. Instructed to hold for another day and needs for recheck in a few days.  Return precautions discussed.   Raeford Razor, MD 01/24/13 [phone]

## 2013-01-20 NOTE — ED Notes (Signed)
Right BKA one month ago Right stump started to hurt last night. Pt describes it as a cramping pain. 10/10 pain twice during EMS ride. Pt currently not complaining of pain.  100/50- BP 106- CBG HR-72 Pt has a home health nurse. Home health states that the BKA might be infected.

## 2013-01-20 NOTE — ED Notes (Signed)
[facility] called to transport patient home 

## 2013-01-20 NOTE — ED Notes (Signed)
Call [facility] for transport to home [address] per RN

## 2013-01-23 ENCOUNTER — Ambulatory Visit: Payer: Medicare Other | Admitting: Internal Medicine

## 2013-01-30 ENCOUNTER — Encounter (HOSPITAL_COMMUNITY): Payer: Self-pay | Admitting: Emergency Medicine

## 2013-01-30 ENCOUNTER — Inpatient Hospital Stay (HOSPITAL_COMMUNITY)
Admission: EM | Admit: 2013-01-30 | Discharge: 2013-02-06 | DRG: 641 | Disposition: A | Discharge status: Home Health | Payer: Medicare Other | Attending: Internal Medicine | Admitting: Internal Medicine

## 2013-01-30 ENCOUNTER — Emergency Department (HOSPITAL_COMMUNITY): Payer: Medicare Other

## 2013-01-30 DIAGNOSIS — I82409 Acute embolism and thrombosis of unspecified deep veins of unspecified lower extremity: Secondary | ICD-10-CM | POA: Diagnosis present

## 2013-01-30 DIAGNOSIS — N179 Acute kidney failure, unspecified: Secondary | ICD-10-CM

## 2013-01-30 DIAGNOSIS — D649 Anemia, unspecified: Secondary | ICD-10-CM

## 2013-01-30 DIAGNOSIS — E1029 Type 1 diabetes mellitus with other diabetic kidney complication: Secondary | ICD-10-CM

## 2013-01-30 DIAGNOSIS — M1990 Unspecified osteoarthritis, unspecified site: Secondary | ICD-10-CM

## 2013-01-30 DIAGNOSIS — I2510 Atherosclerotic heart disease of native coronary artery without angina pectoris: Secondary | ICD-10-CM

## 2013-01-30 DIAGNOSIS — E440 Moderate protein-calorie malnutrition: Secondary | ICD-10-CM | POA: Diagnosis present

## 2013-01-30 DIAGNOSIS — Y835 Amputation of limb(s) as the cause of abnormal reaction of the patient, or of later complication, without mention of misadventure at the time of the procedure: Secondary | ICD-10-CM | POA: Diagnosis present

## 2013-01-30 DIAGNOSIS — I739 Peripheral vascular disease, unspecified: Secondary | ICD-10-CM | POA: Diagnosis present

## 2013-01-30 DIAGNOSIS — R739 Hyperglycemia, unspecified: Secondary | ICD-10-CM

## 2013-01-30 DIAGNOSIS — D509 Iron deficiency anemia, unspecified: Secondary | ICD-10-CM | POA: Diagnosis present

## 2013-01-30 DIAGNOSIS — E876 Hypokalemia: Secondary | ICD-10-CM | POA: Diagnosis present

## 2013-01-30 DIAGNOSIS — R791 Abnormal coagulation profile: Secondary | ICD-10-CM | POA: Diagnosis present

## 2013-01-30 DIAGNOSIS — Z8673 Personal history of transient ischemic attack (TIA), and cerebral infarction without residual deficits: Secondary | ICD-10-CM

## 2013-01-30 DIAGNOSIS — I10 Essential (primary) hypertension: Secondary | ICD-10-CM

## 2013-01-30 DIAGNOSIS — I129 Hypertensive chronic kidney disease with stage 1 through stage 4 chronic kidney disease, or unspecified chronic kidney disease: Secondary | ICD-10-CM | POA: Diagnosis present

## 2013-01-30 DIAGNOSIS — E46 Unspecified protein-calorie malnutrition: Secondary | ICD-10-CM

## 2013-01-30 DIAGNOSIS — Z947 Corneal transplant status: Secondary | ICD-10-CM

## 2013-01-30 DIAGNOSIS — R531 Weakness: Secondary | ICD-10-CM

## 2013-01-30 DIAGNOSIS — K5730 Diverticulosis of large intestine without perforation or abscess without bleeding: Secondary | ICD-10-CM

## 2013-01-30 DIAGNOSIS — K769 Liver disease, unspecified: Secondary | ICD-10-CM

## 2013-01-30 DIAGNOSIS — R2681 Unsteadiness on feet: Secondary | ICD-10-CM

## 2013-01-30 DIAGNOSIS — Z951 Presence of aortocoronary bypass graft: Secondary | ICD-10-CM

## 2013-01-30 DIAGNOSIS — K5732 Diverticulitis of large intestine without perforation or abscess without bleeding: Secondary | ICD-10-CM

## 2013-01-30 DIAGNOSIS — K649 Unspecified hemorrhoids: Secondary | ICD-10-CM

## 2013-01-30 DIAGNOSIS — N189 Chronic kidney disease, unspecified: Secondary | ICD-10-CM | POA: Diagnosis present

## 2013-01-30 DIAGNOSIS — Z86010 Personal history of colonic polyps: Secondary | ICD-10-CM

## 2013-01-30 DIAGNOSIS — M86171 Other acute osteomyelitis, right ankle and foot: Secondary | ICD-10-CM | POA: Diagnosis present

## 2013-01-30 DIAGNOSIS — E785 Hyperlipidemia, unspecified: Secondary | ICD-10-CM | POA: Diagnosis present

## 2013-01-30 DIAGNOSIS — H269 Unspecified cataract: Secondary | ICD-10-CM

## 2013-01-30 DIAGNOSIS — Z66 Do not resuscitate: Secondary | ICD-10-CM | POA: Diagnosis present

## 2013-01-30 DIAGNOSIS — G4700 Insomnia, unspecified: Secondary | ICD-10-CM

## 2013-01-30 DIAGNOSIS — E860 Dehydration: Secondary | ICD-10-CM

## 2013-01-30 DIAGNOSIS — S88119A Complete traumatic amputation at level between knee and ankle, unspecified lower leg, initial encounter: Secondary | ICD-10-CM

## 2013-01-30 DIAGNOSIS — E43 Unspecified severe protein-calorie malnutrition: Secondary | ICD-10-CM

## 2013-01-30 DIAGNOSIS — I82402 Acute embolism and thrombosis of unspecified deep veins of left lower extremity: Secondary | ICD-10-CM

## 2013-01-30 DIAGNOSIS — L89109 Pressure ulcer of unspecified part of back, unspecified stage: Secondary | ICD-10-CM | POA: Diagnosis present

## 2013-01-30 DIAGNOSIS — G609 Hereditary and idiopathic neuropathy, unspecified: Secondary | ICD-10-CM | POA: Diagnosis present

## 2013-01-30 DIAGNOSIS — E049 Nontoxic goiter, unspecified: Secondary | ICD-10-CM

## 2013-01-30 DIAGNOSIS — Z86718 Personal history of other venous thrombosis and embolism: Secondary | ICD-10-CM

## 2013-01-30 DIAGNOSIS — Z7901 Long term (current) use of anticoagulants: Secondary | ICD-10-CM

## 2013-01-30 DIAGNOSIS — R609 Edema, unspecified: Secondary | ICD-10-CM

## 2013-01-30 DIAGNOSIS — E538 Deficiency of other specified B group vitamins: Secondary | ICD-10-CM

## 2013-01-30 DIAGNOSIS — T8789 Other complications of amputation stump: Secondary | ICD-10-CM | POA: Diagnosis present

## 2013-01-30 DIAGNOSIS — E871 Hypo-osmolality and hyponatremia: Secondary | ICD-10-CM

## 2013-01-30 DIAGNOSIS — J309 Allergic rhinitis, unspecified: Secondary | ICD-10-CM

## 2013-01-30 DIAGNOSIS — L8992 Pressure ulcer of unspecified site, stage 2: Secondary | ICD-10-CM | POA: Diagnosis present

## 2013-01-30 DIAGNOSIS — E119 Type 2 diabetes mellitus without complications: Secondary | ICD-10-CM

## 2013-01-30 LAB — COMPREHENSIVE METABOLIC PANEL
Albumin: 1.8 g/dL — ABNORMAL LOW (ref 3.5–5.2)
Alkaline Phosphatase: 61 U/L (ref 39–117)
BUN: 43 mg/dL — ABNORMAL HIGH (ref 6–23)
CO2: 25 mEq/L (ref 19–32)
Creatinine, Ser: 1.89 mg/dL — ABNORMAL HIGH (ref 0.50–1.10)
GFR calc Af Amer: 27 mL/min — ABNORMAL LOW (ref >90)
GFR calc non Af Amer: 23 mL/min — ABNORMAL LOW (ref >90)
Glucose, Bld: 66 mg/dL — ABNORMAL LOW (ref 70–99)
Potassium: 4.8 mEq/L (ref 3.5–5.1)
Total Bilirubin: 0.7 mg/dL (ref 0.3–1.2)
Total Protein: 6.3 g/dL (ref 6.0–8.3)

## 2013-01-30 LAB — CBC
HCT: 30.5 % — ABNORMAL LOW (ref 36.0–46.0)
MCHC: 35.1 g/dL (ref 30.0–36.0)
Platelets: 233 10*3/uL (ref 150–400)
RBC: 3.49 MIL/uL — ABNORMAL LOW (ref 3.87–5.11)
RDW: 14.5 % (ref 11.5–15.5)
WBC: 10.8 10*3/uL — ABNORMAL HIGH (ref 4.0–10.5)

## 2013-01-30 LAB — URINALYSIS, DIPSTICK ONLY
Glucose, UA: NEGATIVE mg/dL
Nitrite: NEGATIVE
Specific Gravity, Urine: 1.011 (ref 1.005–1.030)
Urobilinogen, UA: 1 mg/dL (ref 0.0–1.0)
pH: 5 (ref 5.0–8.0)

## 2013-01-30 LAB — PROTIME-INR
INR: 8.57 (ref 0.00–1.49)
Prothrombin Time: 67 seconds — ABNORMAL HIGH (ref 11.6–15.2)

## 2013-01-30 MED ORDER — WARFARIN - PHARMACIST DOSING INPATIENT
Freq: Every day | Status: DC
  Administered 2013-02-02 – 2013-02-05 (×3)

## 2013-01-30 MED ORDER — SODIUM CHLORIDE 0.9 % IV BOLUS (SEPSIS)
1000.0000 mL | Freq: Once | INTRAVENOUS | Status: AC
  Administered 2013-01-30: 1000 mL via INTRAVENOUS

## 2013-01-30 MED ORDER — ONDANSETRON HCL 4 MG/2ML IJ SOLN
4.0000 mg | Freq: Four times a day (QID) | INTRAMUSCULAR | Status: DC | PRN
  Administered 2013-02-04: 4 mg via INTRAVENOUS
  Filled 2013-01-30: qty 2

## 2013-01-30 MED ORDER — ACETAMINOPHEN 650 MG RE SUPP: 650.0000 mg | Freq: Four times a day (QID) | RECTAL | Status: DC | PRN

## 2013-01-30 MED ORDER — SODIUM CHLORIDE 0.9 % IV SOLN
INTRAVENOUS | Status: DC
  Administered 2013-01-31: 01:00:00 via INTRAVENOUS

## 2013-01-30 MED ORDER — ACETAMINOPHEN 325 MG PO TABS
650.0000 mg | ORAL_TABLET | Freq: Four times a day (QID) | ORAL | Status: DC | PRN
  Administered 2013-01-31 (×2): 650 mg via ORAL
  Filled 2013-01-30 (×2): qty 2

## 2013-01-30 MED ORDER — ONDANSETRON HCL 4 MG PO TABS: 4.0000 mg | ORAL_TABLET | Freq: Four times a day (QID) | ORAL | Status: DC | PRN

## 2013-01-30 MED ORDER — PHYTONADIONE 5 MG PO TABS
2.5000 mg | ORAL_TABLET | Freq: Once | ORAL | Status: AC
  Administered 2013-01-31: 2.5 mg via ORAL
  Filled 2013-01-30: qty 1

## 2013-01-30 NOTE — H&P (Signed)
[facility] History and Physical  Patient: Angelica Lawrence  DGU:[id number]  DOB: [birth year]  DOS: the patient was seen and examined on 01/30/2013 PCP: [name], MD  Chief Complaint: Dehydration  HPI: Angelica Lawrence is a 79 y.o. female with Past medical history of diabetes mellitus, hypertension, coronary artery disease, recent DVT, recent amputation for osteomyelitis of the right leg, recent treatment for cellulitis of the right leg with Keflex one week ago, diabetes mellitus. The patient is coming from home, recently discharged from the nursing home. The patient is presenting with her daughter with initial complaint of back pain but later on the daughter mentions that she has been noticing confusion and hallucination from the patient also she hasn't had urinated since last 2 days. She mentions that her mother generally is incontinent and wears a diaper. The daughter denies any nausea vomiting diarrhea chest pain fever chills coughing or trauma. The patient mentions that she is compliant with all her medication. She was recently seen in the ED 10 days ago and was started on Keflex for cellulitis of the right leg and she has completed the course and the wound is healing well. The patient has poor oral appetite.  Review of Systems: as mentioned in the history of present illness.  A Comprehensive review of the other systems is negative.  Past Medical History  Diagnosis Date  . ALLERGIC RHINITIS 12/30/2006  . ANEMIA, IRON DEFICIENCY 10/21/2009  . COLONIC POLYPS, HX OF 12/30/2006  . CORONARY ARTERY DISEASE 12/30/2006  . DIVERTICULOSIS, COLON 12/30/2006  . HYPERLIPIDEMIA 12/30/2006  . HYPERTENSION 12/30/2006  . PERIPHERAL NEUROPATHY 12/30/2006  . PERIPHERAL VASCULAR DISEASE 12/30/2006  . GOITER 12/30/2006  . VITAMIN B12 DEFICIENCY 09/16/2008  . Carpal tunnel syndrome 12/30/2006  . HEMORRHOIDS 12/30/2006  . DEGENERATIVE JOINT DISEASE 02/10/2007  . INSOMNIA 10/21/2009  . CORNEAL  TRANSPLANT 12/30/2006    family denies this hx on 12/10/2012  . DVT (deep venous thrombosis) 10/2012    "left leg" (12/10/2012)  . Type II diabetes mellitus   . Stroke 1970's?    denies residual on 12/10/2012   Past Surgical History  Procedure Laterality Date  . Appendectomy    . Cholecystectomy    . Abdominal hysterectomy  1960's    and bso  . Below knee leg amputation Left 1989  . Coronary artery bypass graft  2003    CABG X3" (12/10/2012)  . Cardiac catheterization      "once" (12/10/2012)  . Cataract extraction, bilateral Bilateral   . Below knee leg amputation Right 12/10/2012  . Amputation Right 12/10/2012    Procedure: AMPUTATION BELOW RIGHT KNEE;  Surgeon: Nadara Mustard, MD;  Location: [facility] OR;  Service: Orthopedics;  Laterality: Right;   Social History:  reports that she has never smoked. She has never used smokeless tobacco. She reports that she does not drink alcohol or use illicit drugs. Partially Independent for most of her  ADL.  No Known Allergies  Family History  Problem Relation Age of Onset  . Cancer Neg Hx   . Heart disease Neg Hx     Prior to Admission medications   Medication Sig Start Date End Date Taking? Authorizing Provider  atenolol (TENORMIN) 25 MG tablet Take 1 tablet (25 mg total) by mouth daily. 08/18/12  Yes [name], MD  bisacodyl (DULCOLAX) 5 MG EC tablet Take 5 mg by mouth daily as needed for moderate constipation.   Yes Historical Provider, MD  ferrous sulfate ([facility] IRON) 325 (65 FE)  MG tablet Take 325 mg by mouth daily.     Yes Historical Provider, MD  furosemide (LASIX) 40 MG tablet Take 40 mg by mouth daily.   Yes Historical Provider, MD  HYDROcodone-acetaminophen (NORCO/VICODIN) 5-325 MG per tablet Take 1 tablet by mouth every 4 (four) hours as needed for pain.   Yes Historical Provider, MD  isosorbide mononitrate (ISMO,MONOKET) 10 MG tablet Take 10 mg by mouth daily.   Yes Historical Provider, MD  Multiple Vitamin (MULTIVITAMIN WITH MINERALS)  TABS tablet Take 1 tablet by mouth daily.    Yes Historical Provider, MD  omeprazole (PRILOSEC) 20 MG capsule Take 1 capsule (20 mg total) by mouth daily. 08/18/12  Yes [name], MD  simvastatin (ZOCOR) 40 MG tablet Take 1 tablet (40 mg total) by mouth at bedtime. 08/18/12  Yes [name], MD  traMADol (ULTRAM) 50 MG tablet Take 50 mg by mouth every 6 (six) hours as needed for moderate pain.   Yes Historical Provider, MD  traZODone (DESYREL) 100 MG tablet Take 1 tablet (100 mg total) by mouth at bedtime. 04/17/12  Yes [name], MD  warfarin (COUMADIN) 2.5 MG tablet Take 2.5 mg by mouth daily.   Yes Historical Provider, MD    Physical Exam: Filed Vitals:   01/30/13 2045 01/30/13 2100 01/30/13 2115 01/30/13 2130  BP: 118/54 126/57 143/68 109/52  Pulse: 78 82 81 77  Temp:      TempSrc:      Resp: 18 11 17 3   SpO2: 98% 100% 99% 96%    General: Alert, Awake and Oriented to Time, Place and Person. Appear in mild distress Eyes: PERRL ENT: Oral Mucosa clear dry. Neck: No JVD Cardiovascular: S1 and S2 Present, aortic systolic Murmur, Peripheral Pulses Present Respiratory: Bilateral Air entry equal and Decreased, Clear to Auscultation,  No Crackles, no wheezes Abdomen: Bowel Sound Present, Soft and Non tender Skin: No Rash Extremities: Trace Pedal edema, right leg wrapped in dressing Neurologic: Grossly Unremarkable.  Labs on Admission:  CBC:  Recent Labs Lab 01/30/13 1730  WBC 10.8*  HGB 10.7*  HCT 30.5*  MCV 87.4  PLT 233    CMP     Component Value Date/Time   NA 131* 01/30/2013 1730   K 4.8 01/30/2013 1730   CL 94* 01/30/2013 1730   CO2 25 01/30/2013 1730   GLUCOSE 66* 01/30/2013 1730   GLUCOSE 257* 03/19/2006 0902   BUN 43* 01/30/2013 1730   CREATININE 1.89* 01/30/2013 1730   CREATININE 1.69* 12/14/2011 0935   CALCIUM 8.2* 01/30/2013 1730   PROT 6.3 01/30/2013 1730   ALBUMIN 1.8* 01/30/2013 1730   AST 48* 01/30/2013 1730   ALT 18 01/30/2013 1730   ALKPHOS 61  01/30/2013 1730   BILITOT 0.7 01/30/2013 1730   GFRNONAA 23* 01/30/2013 1730   GFRAA 27* 01/30/2013 1730    No results found for this basename: LIPASE, AMYLASE,  in the last 168 hours No results found for this basename: AMMONIA,  in the last 168 hours  No results found for this basename: CKTOTAL, CKMB, CKMBINDEX, TROPONINI,  in the last 168 hours BNP (last 3 results)  Recent Labs  10/16/12 2045 12/16/12 2006  PROBNP 1528.0* 18490.0*    Radiological Exams on Admission: Dg Chest Port 1 View  01/30/2013   CLINICAL DATA:  Respiratory distress  EXAM: PORTABLE CHEST - 1 VIEW  COMPARISON:  December 16, 2012  FINDINGS: The heart size and mediastinal contours are stable. Patient is status post prior CABG and  median sternotomy. There is a small right pleural effusion. There is no focal pneumonia or pulmonary edema. The visualized skeletal structures are stable.  IMPRESSION: Small right pleural effusion. No focal pneumonia or pulmonary edema.   Electronically Signed   By: Sherian Rein M.D.   On: 01/30/2013 19:07     Assessment/Plan Principal Problem:   Acute on chronic renal failure Active Problems:   HYPERTENSION   CORONARY ARTERY DISEASE   DM (diabetes mellitus)   Osteomyelitis of ankle or foot, right, acute   DVT (deep venous thrombosis)   Long term (current) use of anticoagulants   1. Acute on chronic renal failure The patient is presenting with probable confusion which appears resolved at present and decreased urination with poor oral intake. She has been taking Lasix 40 mg daily. Currently her bladder scan is showing to 360 cc of urine an ED has perform in and out catheterization. At present I would gently hydrate her with IV fluids, for which she will be admitted for observation. Based on her labs she does not have any signs of acute infection. Monitor BMP and electrolytes daily. Considering her recent volume overload I would be gentle on hydration  2. Hypertension Continue  Imdur, atenolol  3. Recent DVT Continue Coumadin per pharmacy check INR  DVT Prophylaxis: mechanical compression device Nutrition: Cardiac diet as tolerated  Code Status: DO NOT RESUSCITATE  Family Communication: Daughter was present at bedside, opportunity was given to ask question and all questions were answered satisfactorily at the time of interview. Disposition: Admitted to observation in telemetry unit.  Author: Lynden Oxford, MD [facility] Pager: [phone] 01/30/2013, 9:48 PM    If 7PM-7AM, please contact night-coverage [email or web address] Password [email or web address]    Addendum The patient has elevated INR likely secondary to recent use of antibiotics Considering her history of on and off confusion I would check a CT of her to rule out any intracranial bleeding.  Author: Lynden Oxford, MD [facility] Pager: [phone] 01/31/2013 2:11 AM

## 2013-01-30 NOTE — ED Notes (Signed)
Assisted nurse in urine collection and bladder drainage.

## 2013-01-30 NOTE — Progress Notes (Signed)
CRITICAL VALUE ALERT  Critical value received: INR 8.57 Date of notification: 01/30/13 Time of notification: 2245 Critical value read back: Yes Nurse who received alert: Waymon Budge RN MD notified (1st page): [facility] on call Time of first page: 2305 MD notified (2nd page):  Time of second page:  Responding MD:  Time MD responded:

## 2013-01-30 NOTE — ED Notes (Signed)
Called radiology for chest xray

## 2013-01-30 NOTE — ED Notes (Signed)
Pt brought to ED by EMS with complaint of back pain(pressure sore).But on arrival to the hospital pt denies any pain.Pts daughter at bedside complaining that her mother is dehydrated and confused.Pts GCS 15

## 2013-01-30 NOTE — Progress Notes (Addendum)
ANTICOAGULATION CONSULT NOTE - Initial Consult  Pharmacy Consult for warfarin Indication: hx DVT  No Known Allergies  Patient Measurements:   Heparin Dosing Weight:   Vital Signs: Temp: 98.5 F (36.9 C) (11/21 1615) Temp src: Oral (11/21 1615) BP: 126/57 mmHg (11/21 2100) Pulse Rate: 82 (11/21 2100)  Labs:  Recent Labs  01/30/13 1730  HGB 10.7*  HCT 30.5*  PLT 233  CREATININE 1.89*    The CrCl is unknown because both a height and weight (above a minimum accepted value) are required for this calculation.   Medical History: Past Medical History  Diagnosis Date  . ALLERGIC RHINITIS 12/30/2006  . ANEMIA, IRON DEFICIENCY 10/21/2009  . COLONIC POLYPS, HX OF 12/30/2006  . CORONARY ARTERY DISEASE 12/30/2006  . DIVERTICULOSIS, COLON 12/30/2006  . HYPERLIPIDEMIA 12/30/2006  . HYPERTENSION 12/30/2006  . PERIPHERAL NEUROPATHY 12/30/2006  . PERIPHERAL VASCULAR DISEASE 12/30/2006  . GOITER 12/30/2006  . VITAMIN B12 DEFICIENCY 09/16/2008  . Carpal tunnel syndrome 12/30/2006  . HEMORRHOIDS 12/30/2006  . DEGENERATIVE JOINT DISEASE 02/10/2007  . INSOMNIA 10/21/2009  . CORNEAL TRANSPLANT 12/30/2006    family denies this hx on 12/10/2012  . DVT (deep venous thrombosis) 10/2012    "left leg" (12/10/2012)  . Type II diabetes mellitus   . Stroke 1970's?    denies residual on 12/10/2012    Medications:  Warfarin 2.5mg  daily  Assessment: 39 yof presented to the ED with confusion, pain and decreased UOP. She is on chronic coumadin for recent DVT. No INR available. Noted hx of anemia with H/H 10.7/30.5, plts 233. No bleeding noted  Goal of Therapy:  INR 2-3   Plan:  1. INR now then will dose coumadin based on result 2. Daily INR  Venancio Chenier, Drake Leach 01/30/2013,9:43 PM  Addendum: INR significantly elevated at 8.57.  Plan: 1. No coumadin tonight 2. F/u AM INR  Lysle Pearl, PharmD, BCPS Pager # [phone] 01/30/2013 10:43 PM

## 2013-01-30 NOTE — ED Provider Notes (Signed)
CSN: [id number]     Arrival date & time 01/30/13  1552 History   First MD Initiated Contact with Patient 01/30/13 1636     Chief Complaint  Patient presents with  . Pain   (Consider location/radiation/quality/duration/timing/severity/associated sxs/prior Treatment) HPI  79 year old female here with confusion and decreased urination for one day. She lives at home with her daughter. Her daughter explains that she has soaked diaper one day ago in the morning and has not urinated since. Last night she became confused she began hallucinating talking and grandchildren or not there. She denies any pain currently. This morning her daughter states that she is complaining of buttock pain, abdominal pain, and right stump pain. She has a drink or eat much at baseline that's not changed in the last day. She denies any nausea, vomiting, or diarrhea. She has a sacral wound that is improving and being followed by home health. She had recent right BKA by Dr. [name] and that's also healing well.  Past Medical History  Diagnosis Date  . ALLERGIC RHINITIS 12/30/2006  . ANEMIA, IRON DEFICIENCY 10/21/2009  . COLONIC POLYPS, HX OF 12/30/2006  . CORONARY ARTERY DISEASE 12/30/2006  . DIVERTICULOSIS, COLON 12/30/2006  . HYPERLIPIDEMIA 12/30/2006  . HYPERTENSION 12/30/2006  . PERIPHERAL NEUROPATHY 12/30/2006  . PERIPHERAL VASCULAR DISEASE 12/30/2006  . GOITER 12/30/2006  . VITAMIN B12 DEFICIENCY 09/16/2008  . Carpal tunnel syndrome 12/30/2006  . HEMORRHOIDS 12/30/2006  . DEGENERATIVE JOINT DISEASE 02/10/2007  . INSOMNIA 10/21/2009  . CORNEAL TRANSPLANT 12/30/2006    family denies this hx on 12/10/2012  . DVT (deep venous thrombosis) 10/2012    "left leg" (12/10/2012)  . Type II diabetes mellitus   . Stroke 1970's?    denies residual on 12/10/2012   Past Surgical History  Procedure Laterality Date  . Appendectomy    . Cholecystectomy    . Abdominal hysterectomy  1960's    and bso  . Below knee leg amputation  Left 1989  . Coronary artery bypass graft  2003    CABG X3" (12/10/2012)  . Cardiac catheterization      "once" (12/10/2012)  . Cataract extraction, bilateral Bilateral   . Below knee leg amputation Right 12/10/2012  . Amputation Right 12/10/2012    Procedure: AMPUTATION BELOW RIGHT KNEE;  Surgeon: Nadara Mustard, MD;  Location: [facility] OR;  Service: Orthopedics;  Laterality: Right;   Family History  Problem Relation Age of Onset  . Cancer Neg Hx   . Heart disease Neg Hx    History  Substance Use Topics  . Smoking status: Never Smoker   . Smokeless tobacco: Never Used  . Alcohol Use: No   OB History   Grav Para Term Preterm Abortions TAB SAB Ect Mult Living                 Review of Systems  Constitutional: Negative for fever, chills and appetite change.  HENT: Negative for sore throat.   Respiratory: Negative for cough and shortness of breath.   Cardiovascular: Negative for chest pain.  Gastrointestinal: Negative for abdominal pain.  Genitourinary: Negative for dysuria.  Musculoskeletal: Positive for back pain.  Neurological: Negative for headaches.  All other systems reviewed and are negative.    Allergies  Review of patient's allergies indicates no known allergies.  Home Medications   Current Outpatient Rx  Name  Route  Sig  Dispense  Refill  . atenolol (TENORMIN) 25 MG tablet   Oral   Take 1 tablet (25 mg  total) by mouth daily.   30 tablet   0   . bisacodyl (DULCOLAX) 5 MG EC tablet   Oral   Take 5 mg by mouth daily as needed for moderate constipation.         . ferrous sulfate ([facility] IRON) 325 (65 FE) MG tablet   Oral   Take 325 mg by mouth daily.           . furosemide (LASIX) 40 MG tablet   Oral   Take 40 mg by mouth daily.         Marland Kitchen HYDROcodone-acetaminophen (NORCO/VICODIN) 5-325 MG per tablet   Oral   Take 1 tablet by mouth every 4 (four) hours as needed for pain.         . isosorbide mononitrate (ISMO,MONOKET) 10 MG tablet   Oral   Take 10  mg by mouth daily.         . Multiple Vitamin (MULTIVITAMIN WITH MINERALS) TABS tablet   Oral   Take 1 tablet by mouth daily.          Marland Kitchen omeprazole (PRILOSEC) 20 MG capsule   Oral   Take 1 capsule (20 mg total) by mouth daily.   30 capsule   0   . simvastatin (ZOCOR) 40 MG tablet   Oral   Take 1 tablet (40 mg total) by mouth at bedtime.   30 tablet   0   . traMADol (ULTRAM) 50 MG tablet   Oral   Take 50 mg by mouth every 6 (six) hours as needed for moderate pain.         . traZODone (DESYREL) 100 MG tablet   Oral   Take 1 tablet (100 mg total) by mouth at bedtime.   30 tablet   3   . warfarin (COUMADIN) 2.5 MG tablet   Oral   Take 2.5 mg by mouth daily.          BP 105/43  Pulse 75  Temp(Src) 98.5 F (36.9 C) (Oral)  Resp 16  SpO2 95% Physical Exam  Nursing note and vitals reviewed. Constitutional: She is oriented to person, place, and time. She appears well-developed and well-nourished. No distress.  HENT:  Head: Normocephalic and atraumatic.  Eyes: EOM are normal. Pupils are equal, round, and reactive to light.  Neck: Neck supple.  Cardiovascular: Normal rate, regular rhythm and normal heart sounds.   No murmur heard. Pulmonary/Chest: Effort normal and breath sounds normal.  Abdominal: Soft. Bowel sounds are normal. There is no tenderness. There is no guarding.  Musculoskeletal: She exhibits no edema.  BL BKA,  R  Dressing taken down to reveal well healing incision without exudate, erythema, or warmth Sacral bandage removed to show stage 2-3 wound without warmth, exudate, or surrounding erythema.   Neurological: She is alert and oriented to person, place, and time. She has normal strength. No sensory deficit.  Skin: Skin is warm and dry. She is not diaphoretic.  Psychiatric: She has a normal mood and affect.    ED Course  Procedures (including critical care time) Labs Review Labs Reviewed  COMPREHENSIVE METABOLIC PANEL - Abnormal; Notable for  the following:    Sodium 131 (*)    Chloride 94 (*)    Glucose, Bld 66 (*)    BUN 43 (*)    Creatinine, Ser 1.89 (*)    Calcium 8.2 (*)    Albumin 1.8 (*)    AST 48 (*)    GFR calc non  Af Amer 23 (*)    GFR calc Af Amer 27 (*)    All other components within normal limits  CBC - Abnormal; Notable for the following:    WBC 10.8 (*)    RBC 3.49 (*)    Hemoglobin 10.7 (*)    HCT 30.5 (*)    All other components within normal limits  URINALYSIS, DIPSTICK ONLY - Abnormal; Notable for the following:    Hgb urine dipstick MODERATE (*)    Ketones, ur 15 (*)    Leukocytes, UA TRACE (*)    All other components within normal limits   Imaging Review Dg Chest Port 1 View  01/30/2013   CLINICAL DATA:  Respiratory distress  EXAM: PORTABLE CHEST - 1 VIEW  COMPARISON:  December 16, 2012  FINDINGS: The heart size and mediastinal contours are stable. Patient is status post prior CABG and median sternotomy. There is a small right pleural effusion. There is no focal pneumonia or pulmonary edema. The visualized skeletal structures are stable.  IMPRESSION: Small right pleural effusion. No focal pneumonia or pulmonary edema.   Electronically Signed   By: Sherian Rein M.D.   On: 01/30/2013 19:07    EKG Interpretation   None       MDM   1. Acute kidney injury    79 year old female here with confusion and decreased urination. After workup she has acute kidney injury and appears to be dehydrated. Her fluid status is challenging with hyponatremia, however it appears to be chronic. Considering her previous heart failure I feel that it's unlikely that we can rapidly rehydrate her here in the ED. Confusion possibly dehydration related versus delirium. No signs of infection at either sacral wound or healing BKA wound.   Discussed case with try hospitalist who agreed to admit for IV hydration.   Murtis Sink, MD 01/30/2013, 7:54 PM       Elenora Gamma, MD 01/30/13 [phone]

## 2013-01-30 NOTE — Progress Notes (Signed)
Right BKA with two open areas. Dressing with moderate amount of purulent drainage. Cleansed and wet to dry dressing applied. Will continue to monitor.

## 2013-01-31 ENCOUNTER — Encounter (HOSPITAL_COMMUNITY): Payer: Self-pay | Admitting: General Practice

## 2013-01-31 ENCOUNTER — Observation Stay (HOSPITAL_COMMUNITY): Payer: Medicare Other

## 2013-01-31 DIAGNOSIS — N189 Chronic kidney disease, unspecified: Secondary | ICD-10-CM

## 2013-01-31 DIAGNOSIS — E860 Dehydration: Secondary | ICD-10-CM

## 2013-01-31 DIAGNOSIS — I2510 Atherosclerotic heart disease of native coronary artery without angina pectoris: Secondary | ICD-10-CM

## 2013-01-31 DIAGNOSIS — Z7901 Long term (current) use of anticoagulants: Secondary | ICD-10-CM

## 2013-01-31 DIAGNOSIS — N179 Acute kidney failure, unspecified: Secondary | ICD-10-CM

## 2013-01-31 DIAGNOSIS — I82409 Acute embolism and thrombosis of unspecified deep veins of unspecified lower extremity: Secondary | ICD-10-CM

## 2013-01-31 LAB — CBC
HCT: 30.7 % — ABNORMAL LOW (ref 36.0–46.0)
Hemoglobin: 10.6 g/dL — ABNORMAL LOW (ref 12.0–15.0)
MCH: 29.8 pg (ref 26.0–34.0)
MCV: 86.2 fL (ref 78.0–100.0)
RBC: 3.56 MIL/uL — ABNORMAL LOW (ref 3.87–5.11)
RDW: 14.4 % (ref 11.5–15.5)
WBC: 8.8 10*3/uL (ref 4.0–10.5)

## 2013-01-31 LAB — COMPREHENSIVE METABOLIC PANEL
Alkaline Phosphatase: 67 U/L (ref 39–117)
BUN: 40 mg/dL — ABNORMAL HIGH (ref 6–23)
Calcium: 8.3 mg/dL — ABNORMAL LOW (ref 8.4–10.5)
Chloride: 97 mEq/L (ref 96–112)
Creatinine, Ser: 1.72 mg/dL — ABNORMAL HIGH (ref 0.50–1.10)
GFR calc Af Amer: 30 mL/min — ABNORMAL LOW (ref >90)
Glucose, Bld: 55 mg/dL — ABNORMAL LOW (ref 70–99)
Total Protein: 5.9 g/dL — ABNORMAL LOW (ref 6.0–8.3)

## 2013-01-31 LAB — GLUCOSE, CAPILLARY
Glucose-Capillary: 47 mg/dL — ABNORMAL LOW (ref 70–99)
Glucose-Capillary: 86 mg/dL (ref 70–99)
Glucose-Capillary: 150 mg/dL — ABNORMAL HIGH (ref 70–99)

## 2013-01-31 LAB — PROTIME-INR
INR: 8.09 (ref 0.00–1.49)
Prothrombin Time: 64.2 seconds — ABNORMAL HIGH (ref 11.6–15.2)

## 2013-01-31 LAB — MRSA PCR SCREENING: MRSA by PCR: NEGATIVE

## 2013-01-31 MED ORDER — FERROUS SULFATE 325 (65 FE) MG PO TABS
325.0000 mg | ORAL_TABLET | Freq: Every day | ORAL | Status: DC
  Administered 2013-01-31 – 2013-02-06 (×7): 325 mg via ORAL
  Filled 2013-01-31 (×8): qty 1

## 2013-01-31 MED ORDER — ATENOLOL 25 MG PO TABS
25.0000 mg | ORAL_TABLET | Freq: Every day | ORAL | Status: DC
  Administered 2013-01-31 – 2013-02-02 (×3): 25 mg via ORAL
  Filled 2013-01-31 (×3): qty 1

## 2013-01-31 MED ORDER — DEXTROSE 50 % IV SOLN: 50.0000 mL | Freq: Once | INTRAVENOUS | Status: AC | PRN

## 2013-01-31 MED ORDER — DOCUSATE SODIUM 100 MG PO CAPS
100.0000 mg | ORAL_CAPSULE | Freq: Two times a day (BID) | ORAL | Status: DC
  Administered 2013-01-31 – 2013-02-06 (×13): 100 mg via ORAL
  Filled 2013-01-31 (×17): qty 1

## 2013-01-31 MED ORDER — TRAZODONE HCL 100 MG PO TABS
100.0000 mg | ORAL_TABLET | Freq: Every day | ORAL | Status: DC
  Administered 2013-01-31 – 2013-02-05 (×6): 100 mg via ORAL
  Filled 2013-01-31 (×7): qty 1

## 2013-01-31 MED ORDER — HYDROMORPHONE HCL PF 1 MG/ML IJ SOLN
0.5000 mg | INTRAMUSCULAR | Status: DC | PRN
  Administered 2013-01-31 – 2013-02-01 (×4): 1 mg via INTRAVENOUS
  Administered 2013-02-03: 0.5 mg via INTRAVENOUS
  Administered 2013-02-03: 1 mg via INTRAVENOUS
  Administered 2013-02-04: 0.5 mg via INTRAVENOUS
  Filled 2013-01-31 (×7): qty 1

## 2013-01-31 MED ORDER — HYDROCODONE-ACETAMINOPHEN 5-325 MG PO TABS
1.0000 | ORAL_TABLET | ORAL | Status: DC | PRN
  Administered 2013-01-31: 1 via ORAL
  Filled 2013-01-31: qty 1

## 2013-01-31 MED ORDER — DEXTROSE-NACL 5-0.45 % IV SOLN
INTRAVENOUS | Status: DC
  Administered 2013-01-31: 10:00:00 via INTRAVENOUS
  Administered 2013-02-01: 1000 mL via INTRAVENOUS
  Administered 2013-02-01: 19:00:00 via INTRAVENOUS
  Administered 2013-02-02: 75 mL/h via INTRAVENOUS
  Administered 2013-02-03 – 2013-02-04 (×4): via INTRAVENOUS

## 2013-01-31 MED ORDER — ADULT MULTIVITAMIN W/MINERALS CH
1.0000 | ORAL_TABLET | Freq: Every day | ORAL | Status: DC
  Administered 2013-01-31 – 2013-02-06 (×7): 1 via ORAL
  Filled 2013-01-31 (×7): qty 1

## 2013-01-31 MED ORDER — DEXTROSE 50 % IV SOLN: 25.0000 mL | Freq: Once | INTRAVENOUS | Status: AC | PRN

## 2013-01-31 MED ORDER — POTASSIUM CHLORIDE CRYS ER 20 MEQ PO TBCR
40.0000 meq | EXTENDED_RELEASE_TABLET | Freq: Once | ORAL | Status: AC
  Administered 2013-01-31: 40 meq via ORAL
  Filled 2013-01-31: qty 2

## 2013-01-31 MED ORDER — GLUCOSE 40 % PO GEL
ORAL | Status: AC
  Administered 2013-01-31: 37.5 g
  Filled 2013-01-31: qty 1

## 2013-01-31 MED ORDER — SIMVASTATIN 40 MG PO TABS
40.0000 mg | ORAL_TABLET | Freq: Every day | ORAL | Status: DC
  Administered 2013-02-01 – 2013-02-05 (×6): 40 mg via ORAL
  Filled 2013-01-31 (×9): qty 1

## 2013-01-31 MED ORDER — PANTOPRAZOLE SODIUM 40 MG PO TBEC
40.0000 mg | DELAYED_RELEASE_TABLET | Freq: Every day | ORAL | Status: DC
  Administered 2013-01-31 – 2013-02-06 (×7): 40 mg via ORAL
  Filled 2013-01-31 (×3): qty 1

## 2013-01-31 MED ORDER — GLUCOSE 40 % PO GEL
1.0000 | ORAL | Status: DC | PRN
  Administered 2013-01-31 (×2): 37.5 g via ORAL

## 2013-01-31 MED ORDER — ENSURE COMPLETE PO LIQD
237.0000 mL | Freq: Two times a day (BID) | ORAL | Status: DC
  Administered 2013-01-31 – 2013-02-06 (×11): 237 mL via ORAL

## 2013-01-31 NOTE — Progress Notes (Signed)
Coumadin PTA  AC: Warfarin for hx DVT in 10/14 - INR 8.57 >> 8.09, Hgb 10.6, plts 234 K, last dose 11/20, no bleeding noted.  Given vitamin K on 11/21 . Goal INR 2-3  Plan: 1. No coumadin tonight 2. Daily INR

## 2013-01-31 NOTE — Evaluation (Signed)
Physical Therapy Evaluation Patient Details Name: Angelica Lawrence MRN: [id number] DOB: 07-Sep-1926 Today's Date: 01/31/2013 Time: 4098-1191 PT Time Calculation (min): 24 min  PT Assessment / Plan / Recommendation History of Present Illness  pt is a 79 y.o. female who was recently D/C from nursing home to home with family. Pt adm initially secondary to c/o back pain; daughter reported pt has been more confused and hallucinating recently. Pt being treated for dehydration.   Clinical Impression  Pt adm secondary to above. Pt is a poor historian and it is unclear how much caregiver (A) pt has at home. Pt was agreeable to sit EOB with PT and was max (A) for bed mobility. Pt to benefit from skilled PT to address deficits below (see PT problem list). Will need to meet with family to determine if proper (A) can be provided for wheelchair <> BSC <> bed transfers. Pt would benefit from hospital bed and possible hoyer lift if planning to D/C home. Per new MD note; INR is elevated > 8, pt being treated at this time with vit k as of 01/30/13.     PT Assessment  Patient needs continued PT services    Follow Up Recommendations  Supervision/Assistance - 24 hour;Other (comment);Home health PT (pending family caregiver support )    Does the patient have the potential to tolerate intense rehabilitation      Barriers to Discharge   pt is a poor historian; inconsistant with reports on how her family was transferring her at home    Equipment Recommendations  Hospital bed;Other (comment) (hoyer lift to transfer pt )    Recommendations for Other Services     Frequency Min 2X/week    Precautions / Restrictions Precautions Precautions: Fall Restrictions Weight Bearing Restrictions: No   Pertinent Vitals/Pain 9/10 in low back; pt premedicated per RN and patient repositioned for comfort       Mobility  Bed Mobility Bed Mobility: Rolling Left;Rolling Right;Left Sidelying to Sit;Sitting - Scoot to Edge of  Bed;Sit to Supine Rolling Right: 4: Min assist;With rail Rolling Left: 4: Min assist;With rail Left Sidelying to Sit: 2: Max assist;HOB elevated;With rails Sitting - Scoot to Edge of Bed: 2: Max assist Sit to Supine: 2: Max assist;HOB flat Details for Bed Mobility Assistance: pt with difficulty manuevering in bed secondary to generalized weakness; pt required max encouragement and cues for hand placement and sequencing; pt tearful and c/o pain in her low back region; pt required max (A) with use of the draw pad to bring hips and shoulders up to sitting position at EOB Transfers Transfers: Not assessed Ambulation/Gait Ambulation/Gait Assistance: Other (comment) (pt non ambulatory PTA) Stairs: No Wheelchair Mobility Wheelchair Mobility: No         PT Diagnosis: Generalized weakness;Acute pain  PT Problem List: Decreased strength;Decreased activity tolerance;Decreased balance;Decreased mobility;Decreased cognition PT Treatment Interventions: Functional mobility training;Therapeutic activities;Therapeutic exercise;Balance training;Neuromuscular re-education;Wheelchair mobility training;Patient/family education     PT Goals(Current goals can be found in the care plan section) Acute Rehab PT Goals Patient Stated Goal: to go back home PT Goal Formulation: With patient Time For Goal Achievement: 02/14/13 Potential to Achieve Goals: Fair  Visit Information  Last PT Received On: 01/31/13 Assistance Needed: +2 History of Present Illness: pt is a 79 y.o. female who was recently D/C from nursing home to home with family. Pt adm initially secondary to c/o back pain; daughter reported pt has been more confused and hallucinating recently. Pt being treated for dehydration.  Prior Functioning  Home Living Family/patient expects to be discharged to:: Private residence Living Arrangements: Children Available Help at Discharge: Family;Available 24 hours/day Type of Home: House Home Access:  Ramped entrance Home Layout: One level Home Equipment: Walker - 2 wheels;Wheelchair - manual;Bedside commode Additional Comments: pt poor historian; unclear if family uses lift or not to transfer pt Prior Function Level of Independence: Needs assistance Gait / Transfers Assistance Needed: total assist for transfers bed<> WC,BSC ADL's / Homemaking Assistance Needed: max assist Communication Communication: No difficulties    Cognition  Cognition Arousal/Alertness: Awake/alert Behavior During Therapy: WFL for tasks assessed/performed Overall Cognitive Status: Impaired/Different from baseline Area of Impairment: Orientation;Memory;Following commands;Problem solving;Attention Orientation Level: Disoriented to;Situation;Place;Time Current Attention Level: Selective Memory: Decreased short-term memory Following Commands: Follows one step commands inconsistently;Follows one step commands with increased time Problem Solving: Slow processing;Decreased initiation;Difficulty sequencing;Requires verbal cues;Requires tactile cues General Comments: pt inconsitant with information given; seemed to get frustrated and more confused with complex questions reqgarding PLOF and home enviroment     Extremity/Trunk Assessment Upper Extremity Assessment Upper Extremity Assessment: Defer to OT evaluation Lower Extremity Assessment Lower Extremity Assessment: Generalized weakness (bil BKAs) Cervical / Trunk Assessment Cervical / Trunk Assessment: Kyphotic   Balance Balance Balance Assessed: Yes Static Sitting Balance Static Sitting - Balance Support: Bilateral upper extremity supported;Feet unsupported Static Sitting - Level of Assistance: 5: Stand by assistance;4: Min assist Static Sitting - Comment/# of Minutes: at times pt with posterior lean and would require min (A) to maintain upright sitting position at EOB; pt tolerated sitting EOB ~9 min but was tearful and c/o low back pain; returned to supine  secondary to pain  End of Session PT - End of Session Activity Tolerance: Patient limited by pain;Patient limited by fatigue Patient left: in bed;with call bell/phone within reach Nurse Communication: Need for lift equipment;Mobility status;Patient requests pain meds  GP Functional Assessment Tool Used: clinical judgement  Functional Limitation: Mobility: Walking and moving around Mobility: Walking and Moving Around Current Status [phone]): At least 80 percent but less than 100 percent impaired, limited or restricted Mobility: Walking and Moving Around Goal Status [phone]): At least 60 percent but less than 80 percent impaired, limited or restricted   Donell Sievert,  [phone] 01/31/2013, 12:35 PM

## 2013-01-31 NOTE — Progress Notes (Signed)
INITIAL NUTRITION ASSESSMENT  Pt meets criteria for severe MALNUTRITION in the context of chronic illness as evidenced by <75% estimated energy intake with 24% weight loss in the past 7 months.  DOCUMENTATION CODES Per approved criteria  -Severe malnutrition in the context of chronic illness   INTERVENTION: - Continue Ensure Complete BID - Recommend MD check phosphorus level as pt with poor appetite and significant weight loss and may be at risk for refeeding syndrome. Already has low potassium and magnesium, suspect phosphorus may be low as well. Recommend daily monitoring of potassium, magnesium, and phosphorus x 3 days and replete as needed.  - Downgrade diet to dysphagia 3/thin liquids  - Unit RD to continue to monitor   NUTRITION DIAGNOSIS: Unintended weight loss related to poor appetite as evidenced by weight trend.   Goal: Pt to consume >90% of meals/supplements  Monitor:  Weights, labs, intake  Reason for Assessment: Nutrition risk   79 y.o. female  Admitting Dx: Acute on chronic renal failure  ASSESSMENT: Pt with history of diabetes mellitus, hypertension, coronary artery disease, recent DVT, recent amputation for osteomyelitis of the right leg, recent treatment for cellulitis of the right leg with Keflex one week ago, diabetes mellitus. Admitted with her daughter with initial complaint of back pain but later on the daughter mentioned that she has been noticing confusion and hallucination from the patient also she hasn't had urinated since last 2 days. She mentions that her mother generally is incontinent and wears a diaper. The daughter denies any nausea vomiting diarrhea chest pain fever chills coughing or trauma.  The patient mentions that she is compliant with all her medication. The patient has poor oral appetite per H&P.   Attempted to meet with pt however pt yelling out and complained of pain, notified RN who reports pt has been doing this most of the day. No family  present. This RD familiar with pt from admission to [facility] in August 2014. Noted at that time daughter reported pt was eating 3 meals/day PTA but only a few bites at each meal. Reported pt weighed 175 pounds in April 2014, now down to 133 pounds, however did have right BKA performed last month. Daughter was giving pt Ensure mixed with ice cream or yogurt at that time and reported pt had dentures but does not wear them so she needs soft foods. Per chart, pt ate 65% of breakfast this morning.   Potassium low and magnesium slightly low. Getting daily multivitamin and oral potassium chloride.  BUN/Cr elevated with low GFR   Height: Ht Readings from Last 1 Encounters:  01/30/13 5\' 7"  (1.702 m)    Weight: Wt Readings from Last 1 Encounters:  01/30/13 133 lb (60.328 kg)    Ideal Body Weight: 119 lb adjusted for right/left BKA  % Ideal Body Weight: 112%  Wt Readings from Last 10 Encounters:  01/30/13 133 lb (60.328 kg)  01/20/13 164 lb (74.39 kg)  01/06/13 160 lb (72.576 kg)  12/24/12 163 lb (73.936 kg)  12/17/12 178 lb (80.74 kg)  12/16/12 185 lb (83.915 kg)  12/15/12 162 lb (73.483 kg)  11/12/12 152 lb (68.947 kg)  10/23/12 148 lb (67.132 kg)  10/16/12 134 lb 9.6 oz (61.054 kg)    Usual Body Weight: 175 lb in April 2014 per daughter  % Usual Body Weight: 76%  BMI:  Body mass index is 20.83 kg/(m^2).  Estimated Nutritional Needs: Kcal: 1650-1850 Protein: 65-85g Fluid: 1.6-1.8L/day  Skin: stage 2 sacral pressure ulcer, right  and left BKA  Diet Order: General  EDUCATION NEEDS: -No education needs identified at this time   Intake/Output Summary (Last 24 hours) at 01/31/13 1238 Last data filed at 01/31/13 0900  Gross per 24 hour  Intake    240 ml  Output    575 ml  Net   -335 ml    Last BM: PTA  Labs:   Recent Labs Lab 01/30/13 1730 01/31/13 0511  NA 131* 136  K 4.8 3.0*  CL 94* 97  CO2 25 25  BUN 43* 40*  CREATININE 1.89* 1.72*  CALCIUM 8.2* 8.3*   MG  --  1.4*  GLUCOSE 66* 55*    CBG (last 3)   Recent Labs  01/31/13 0753 01/31/13 0847  GLUCAP 47* 86    Scheduled Meds: . atenolol  25 mg Oral Daily  . dextrose      . docusate sodium  100 mg Oral BID  . feeding supplement (ENSURE COMPLETE)  237 mL Oral BID BM  . ferrous sulfate  325 mg Oral Q breakfast  . multivitamin with minerals  1 tablet Oral Daily  . pantoprazole  40 mg Oral Daily  . simvastatin  40 mg Oral q1800  . traZODone  100 mg Oral QHS  . Warfarin - Pharmacist Dosing Inpatient   Does not apply q1800    Continuous Infusions: . dextrose 5 % and 0.45% NaCl      Past Medical History  Diagnosis Date  . ALLERGIC RHINITIS 12/30/2006  . ANEMIA, IRON DEFICIENCY 10/21/2009  . COLONIC POLYPS, HX OF 12/30/2006  . CORONARY ARTERY DISEASE 12/30/2006  . DIVERTICULOSIS, COLON 12/30/2006  . HYPERLIPIDEMIA 12/30/2006  . HYPERTENSION 12/30/2006  . PERIPHERAL NEUROPATHY 12/30/2006  . PERIPHERAL VASCULAR DISEASE 12/30/2006  . GOITER 12/30/2006  . VITAMIN B12 DEFICIENCY 09/16/2008  . Carpal tunnel syndrome 12/30/2006  . HEMORRHOIDS 12/30/2006  . DEGENERATIVE JOINT DISEASE 02/10/2007  . INSOMNIA 10/21/2009  . CORNEAL TRANSPLANT 12/30/2006    family denies this hx on 12/10/2012  . DVT (deep venous thrombosis) 10/2012    "left leg" (12/10/2012)  . Type II diabetes mellitus   . Stroke 1970's?    denies residual on 12/10/2012    Past Surgical History  Procedure Laterality Date  . Appendectomy    . Cholecystectomy    . Abdominal hysterectomy  1960's    and bso  . Below knee leg amputation Left 1989  . Coronary artery bypass graft  2003    CABG X3" (12/10/2012)  . Cardiac catheterization      "once" (12/10/2012)  . Cataract extraction, bilateral Bilateral   . Below knee leg amputation Right 12/10/2012  . Amputation Right 12/10/2012    Procedure: AMPUTATION BELOW RIGHT KNEE;  Surgeon: Nadara Mustard, MD;  Location: [facility] OR;  Service: Orthopedics;  Laterality: Right;     Levon Hedger MS, RD, LDN [phone] Weekend/After Hours Pager

## 2013-01-31 NOTE — Progress Notes (Signed)
Hypoglycemic Event  CBG: 46  Treatment: dextrose gel  Symptoms: none  Follow-up CBG: Time .0845 CBG Result:84  Possible Reasons for Event:  Poor p.o. Intake? Comments/MD notified: yes    Angelica Lawrence, [name]  Remember to initiate Hypoglycemia Order Set & complete

## 2013-01-31 NOTE — Progress Notes (Addendum)
[facility] PROGRESS NOTE  KENZLEIGH SEDAM GNF:[id number] DOB: 1927/03/05 DOA: 01/30/2013 PCP: [name], MD  Assessment/Plan  Acute on chronic renal failure likely secondary to dehydration.  Baseline creatinine of 0.8-1, peak creatinine 1.89 at presentation.  Trending down with hydration -  Continue IVF -  Continue to hold diuretics  HTN, BP low normal to mildly elevated -  Restart atenolol with hold parameters -  Continue to hold imdur for now  Left leg DVT with supratherapeutic INR 12/10/2012 -  Continue hold warfarin -  Given vitamin K only a few hours prior to rechecking INR -  No evidence of bleeding -  Repeat INR in AM -  F/u head CT   Suprapubic bdominal pain.  DDX includes diverticulitis and hernia.  UA neg and LFTs wnl.  Pain resolving spontaneously per patient.  -  If pain worsens, CT abd/pelvis with oral contrast only given AKI  CAD, chest pain free.  Hold a/c.  Not on ASA.   -  Restart atenolol with hold parameter for BP -  Restart statin -  Restart imdur when BP more stable  T2DM, diet controlled and currently hypoglycemic  Chronic iron deficiency anemia, hemoglobin at baseline, restart iron supplementation  PVD s/p amputation by Dr. [name], now with wound dehiscence.  Does not appear infected at this time.  -  Wound care consult -  Wound culture  -  No antibiotics  -  Will consult Dr. [name] for reevaluation   Hypokalemia, likely due to dehydration  -  Oral potassium repletion  Moderate protein calorie malnutrition -  Regular diet -  supplements  Diet:  regular Access:  PIV IVF:  yes Proph:  supratherapeutic warfarin  Code Status: DNR Family Communication: patient alone Disposition Plan: pending improvement in creatinine and evaluation of right leg    Consultants:  Orthopedics, Dr. [name]  Wound care  Procedures:  CT head  Antibiotics:  None   HPI/Subjective:  Patient states she has worsening pain of the right leg and of her back  after her CT head overnight.  She denies difficulty breathing, nausea.  Her abdominal pain is improved post BM overnight.  Did not look at her stool to see if there was blood.    Objective: Filed Vitals:   01/30/13 2145 01/30/13 2200 01/30/13 2305 01/31/13 0404  BP: 122/53 132/59 116/52 131/50  Pulse: 80 76 76 79  Temp:   98.2 F (36.8 C) 98 F (36.7 C)  TempSrc:   Oral Axillary  Resp: 25 20 21 23   Height:   5\' 7"  (1.702 m)   Weight:   60.328 kg (133 lb)   SpO2: 100% 100% 99% 100%    Intake/Output Summary (Last 24 hours) at 01/31/13 0817 Last data filed at 01/30/13 2120  Gross per 24 hour  Intake      0 ml  Output    225 ml  Net   -225 ml   Filed Weights   01/30/13 2305  Weight: 60.328 kg (133 lb)    Exam:   General:  Thin AAF, No acute distress, mumbled voice, dry skin  HEENT:  NCAT, mildly dry MM  Cardiovascular:  RRR, nl S1, S2 no mrg, 2+ pulses, warm extremities  Respiratory:  CTAB, no increased WOB  Abdomen:   NABS, soft, TTP in the suprapubic region without rebound or guarding   MSK:   Normal tone and bulk, no LEE  Neuro:  Grossly intact  Psych:  Oriented to person, place, Nov  and Friday  Data Reviewed: Basic Metabolic Panel:  Recent Labs Lab 01/30/13 1730 01/31/13 0511  NA 131* 136  K 4.8 3.0*  CL 94* 97  CO2 25 25  GLUCOSE 66* 55*  BUN 43* 40*  CREATININE 1.89* 1.72*  CALCIUM 8.2* 8.3*   Liver Function Tests:  Recent Labs Lab 01/30/13 1730 01/31/13 0511  AST 48* 22  ALT 18 11  ALKPHOS 61 67  BILITOT 0.7 0.8  PROT 6.3 5.9*  ALBUMIN 1.8* 1.7*   No results found for this basename: LIPASE, AMYLASE,  in the last 168 hours No results found for this basename: AMMONIA,  in the last 168 hours CBC:  Recent Labs Lab 01/30/13 1730 01/31/13 0511  WBC 10.8* 8.8  HGB 10.7* 10.6*  HCT 30.5* 30.7*  MCV 87.4 86.2  PLT 233 234   Cardiac Enzymes: No results found for this basename: CKTOTAL, CKMB, CKMBINDEX, TROPONINI,  in the last 168  hours BNP (last 3 results)  Recent Labs  10/16/12 2045 12/16/12 2006  PROBNP 1528.0* 18490.0*   CBG: No results found for this basename: GLUCAP,  in the last 168 hours  Recent Results (from the past 240 hour(s))  MRSA PCR SCREENING     Status: None   Collection Time    01/30/13 11:19 PM      Result Value Range Status   MRSA by PCR NEGATIVE  NEGATIVE Final   Comment:            The GeneXpert MRSA Assay (FDA     approved for NASAL specimens     only), is one component of a     comprehensive MRSA colonization     surveillance program. It is not     intended to diagnose MRSA     infection nor to guide or     monitor treatment for     MRSA infections.     Studies: Dg Chest Port 1 View  01/30/2013   CLINICAL DATA:  Respiratory distress  EXAM: PORTABLE CHEST - 1 VIEW  COMPARISON:  December 16, 2012  FINDINGS: The heart size and mediastinal contours are stable. Patient is status post prior CABG and median sternotomy. There is a small right pleural effusion. There is no focal pneumonia or pulmonary edema. The visualized skeletal structures are stable.  IMPRESSION: Small right pleural effusion. No focal pneumonia or pulmonary edema.   Electronically Signed   By: Sherian Rein M.D.   On: 01/30/2013 19:07    Scheduled Meds: . dextrose      . potassium chloride  40 mEq Oral Once  . Warfarin - Pharmacist Dosing Inpatient   Does not apply q1800   Continuous Infusions: . sodium chloride 100 mL/hr at 01/31/13 0101    Principal Problem:   Acute on chronic renal failure Active Problems:   HYPERTENSION   CORONARY ARTERY DISEASE   DM (diabetes mellitus)   Osteomyelitis of ankle or foot, right, acute   DVT (deep venous thrombosis)   Long term (current) use of anticoagulants    Time spent: 30 min    Jeffre Enriques, [facility]  [facility] Pager [phone]. If 7PM-7AM, please contact night-coverage at [email or web address], password [facility] 01/31/2013, 8:17 AM  LOS: 1 day

## 2013-01-31 NOTE — Plan of Care (Signed)
Problem: Acute Rehab PT Goals(only PT should resolve) Goal: Pt Will Transfer Bed To Chair/Chair To Bed Pt family to be able to demo proper technique for transferring pt bed <> wheelchair <> BSC in order to D/C home.

## 2013-01-31 NOTE — Progress Notes (Signed)
Patient ID: Angelica Lawrence, female   DOB: [birth year], 79 y.o.   MRN: [id number]  Angelica Campbell, MD                  Jacqualine Code, PA-C   [address] [place], [place], [place]  [address]   ORTHOPAEDIC CONSULTATION  Angelica Lawrence            MRN:  [id number] DOB/SEX:  1926/08/16/female    REQUESTING PHYSICIAN: [facility]   CHIEF COMPLAINT: evaluate right BKA stump wound  HISTORY: Angelica Lawrence a 79 y.o. female with history DM and PVD. Had R BKA 12/10/12 by Dr [name]. Recently D?c'd to home after rehab stay. Came to ER yesterday with increasing confusion and noted to have open areas at BKA stump without drainage. Ortho consulted.   PAST MEDICAL HISTORY: Patient Active Problem List   Diagnosis Date Noted  . Long term (current) use of anticoagulants 12/25/2012  . Gait instability 10/23/2012  . Generalized weakness 10/23/2012  . Osteomyelitis of ankle or foot, right, acute 10/23/2012  . DVT (deep venous thrombosis) 10/23/2012  . Anemia 10/17/2012  . Closed fibular fracture 10/17/2012  . Decubitus ulcer, ankle 10/17/2012  . Hyperglycemia 10/17/2012  . Hyponatremia 10/17/2012  . Malnutrition 10/17/2012  . Protein-calorie malnutrition, severe 10/17/2012  . Acute on chronic renal failure 10/17/2012  . Diverticulitis of cecum 09/20/2011  . DM (diabetes mellitus) 09/20/2011  . Dehydration, mild 09/20/2011  . Type I (juvenile type) diabetes mellitus with renal manifestations, not stated as uncontrolled 07/23/2011  . ANEMIA, IRON DEFICIENCY 10/21/2009  . INSOMNIA 10/21/2009  . EDEMA 09/01/2009  . UNSPECIFIED DISORDER OF LIVER 04/12/2009  . VITAMIN B12 DEFICIENCY 09/16/2008  . DEGENERATIVE JOINT DISEASE 02/10/2007  . GOITER 12/30/2006  . HYPERLIPIDEMIA 12/30/2006  . CARPAL TUNNEL SYNDROME 12/30/2006  . PERIPHERAL NEUROPATHY 12/30/2006  . CATARACTS, BILATERAL 12/30/2006  . HYPERTENSION 12/30/2006  . CORONARY ARTERY DISEASE 12/30/2006  . PERIPHERAL VASCULAR DISEASE 12/30/2006  .  HEMORRHOIDS 12/30/2006  . ALLERGIC RHINITIS 12/30/2006  . DIVERTICULOSIS, COLON 12/30/2006  . COLONIC POLYPS, HX OF 12/30/2006  . CORNEAL TRANSPLANT 12/30/2006  . BKA, LEFT LEG 12/30/2006   Past Medical History  Diagnosis Date  . ALLERGIC RHINITIS 12/30/2006  . ANEMIA, IRON DEFICIENCY 10/21/2009  . COLONIC POLYPS, HX OF 12/30/2006  . CORONARY ARTERY DISEASE 12/30/2006  . DIVERTICULOSIS, COLON 12/30/2006  . HYPERLIPIDEMIA 12/30/2006  . HYPERTENSION 12/30/2006  . PERIPHERAL NEUROPATHY 12/30/2006  . PERIPHERAL VASCULAR DISEASE 12/30/2006  . GOITER 12/30/2006  . VITAMIN B12 DEFICIENCY 09/16/2008  . Carpal tunnel syndrome 12/30/2006  . HEMORRHOIDS 12/30/2006  . DEGENERATIVE JOINT DISEASE 02/10/2007  . INSOMNIA 10/21/2009  . CORNEAL TRANSPLANT 12/30/2006    family denies this hx on 12/10/2012  . DVT (deep venous thrombosis) 10/2012    "left leg" (12/10/2012)  . Type II diabetes mellitus   . Stroke 1970's?    denies residual on 12/10/2012   Past Surgical History  Procedure Laterality Date  . Appendectomy    . Cholecystectomy    . Abdominal hysterectomy  1960's    and bso  . Below knee leg amputation Left 1989  . Coronary artery bypass graft  2003    CABG X3" (12/10/2012)  . Cardiac catheterization      "once" (12/10/2012)  . Cataract extraction, bilateral Bilateral   . Below knee leg amputation Right 12/10/2012  . Amputation Right 12/10/2012    Procedure: AMPUTATION BELOW RIGHT KNEE;  Surgeon: Nadara Mustard, MD;  Location:  [facility] OR;  Service: Orthopedics;  Laterality: Right;     MEDICATIONS:  Current facility-administered medications:acetaminophen (TYLENOL) suppository 650 mg, 650 mg, Rectal, Q6H PRN, Lynden Oxford, MD;  acetaminophen (TYLENOL) tablet 650 mg, 650 mg, Oral, Q6H PRN, Lynden Oxford, MD, 650 mg at 01/31/13 0402;  atenolol (TENORMIN) tablet 25 mg, 25 mg, Oral, Daily, Renae Fickle, MD;  dextrose (GLUTOSE) 40 % oral gel 37.5 g, 1 Tube, Oral, PRN, Renae Fickle, MD, 37.5 g at  01/31/13 1039 dextrose (GLUTOSE) 40 % oral gel, , , , ;  dextrose 5 %-0.45 % sodium chloride infusion, , Intravenous, Continuous, Renae Fickle, MD;  dextrose 50 % solution 25 mL, 25 mL, Intravenous, Once PRN, Renae Fickle, MD;  dextrose 50 % solution 50 mL, 50 mL, Intravenous, Once PRN, Renae Fickle, MD;  docusate sodium (COLACE) capsule 100 mg, 100 mg, Oral, BID, Renae Fickle, MD feeding supplement (ENSURE COMPLETE) (ENSURE COMPLETE) liquid 237 mL, 237 mL, Oral, BID BM, Renae Fickle, MD;  ferrous sulfate tablet 325 mg, 325 mg, Oral, Q breakfast, Renae Fickle, MD;  HYDROcodone-acetaminophen (NORCO/VICODIN) 5-325 MG per tablet 1 tablet, 1 tablet, Oral, Q4H PRN, Renae Fickle, MD, 1 tablet at 01/31/13 1039;  multivitamin with minerals tablet 1 tablet, 1 tablet, Oral, Daily, Renae Fickle, MD ondansetron [facility]) injection 4 mg, 4 mg, Intravenous, Q6H PRN, Lynden Oxford, MD;  ondansetron (ZOFRAN) tablet 4 mg, 4 mg, Oral, Q6H PRN, Lynden Oxford, MD;  pantoprazole (PROTONIX) EC tablet 40 mg, 40 mg, Oral, Daily, Renae Fickle, MD;  simvastatin (ZOCOR) tablet 40 mg, 40 mg, Oral, q1800, Renae Fickle, MD;  traZODone (DESYREL) tablet 100 mg, 100 mg, Oral, QHS, Renae Fickle, MD Warfarin - Pharmacist Dosing Inpatient, , Does not apply, q1800, Drake Leach Rumbarger, RPH  ALLERGIES:  No Known Allergies  REVIEW OF SYSTEMS: REVIEWED IN DETAIL IN CHART  FAMILY HISTORY:   Family History  Problem Relation Age of Onset  . Cancer Neg Hx   . Heart disease Neg Hx     SOCIAL HISTORY:   History  Substance Use Topics  . Smoking status: Never Smoker   . Smokeless tobacco: Never Used  . Alcohol Use: No     EXAMINATION: Vital signs in last 24 hours: Temp:  [98 F (36.7 C)-98.5 F (36.9 C)] 98.2 F (36.8 C) (11/22 1000) Pulse Rate:  [75-105] 76 (11/22 1000) Resp:  [11-25] 20 (11/22 1000) BP: (105-143)/(43-68) 117/67 mmHg (11/22 1000) SpO2:  [95 %-100 %] 100 % (11/22 1000) Weight:   [60.328 kg (133 lb)] 60.328 kg (133 lb) (11/21 2305)  right BKA stump cool with areas of necrosis and breakdown. No drainage or erythema.   DIAGNOSTIC STUDIES: Recent laboratory studies:  Recent Labs  01/30/13 1730 01/31/13 0511  WBC 10.8* 8.8  HGB 10.7* 10.6*  HCT 30.5* 30.7*  PLT 233 234    Recent Labs  01/30/13 1730 01/31/13 0511  NA 131* 136  K 4.8 3.0*  CL 94* 97  CO2 25 25  BUN 43* 40*  CREATININE 1.89* 1.72*  GLUCOSE 66* 55*  CALCIUM 8.2* 8.3*   Lab Results  Component Value Date   INR 8.09* 01/31/2013   INR 8.57* 01/30/2013   INR 3.99* 01/20/2013     Recent Radiographic Studies :  Ct Head Wo Contrast  01/31/2013   CLINICAL DATA:  Altered mental status and elevated INR level.  EXAM: CT HEAD WITHOUT CONTRAST  TECHNIQUE: Contiguous axial images were obtained from the base of the skull through the vertex without intravenous contrast.  COMPARISON:  None.  FINDINGS: The brain demonstrates mild periventricular small vessel ischemic changes and diffuse cortical atrophy. The brain demonstrates no evidence of hemorrhage, acute infarction, edema, mass effect, extra-axial fluid collection, hydrocephalus or mass lesion. The skull is unremarkable.  IMPRESSION: No acute findings.   Electronically Signed   By: Irish Lack M.D.   On: 01/31/2013 08:44   Dg Chest Port 1 View  01/30/2013   CLINICAL DATA:  Respiratory distress  EXAM: PORTABLE CHEST - 1 VIEW  COMPARISON:  December 16, 2012  FINDINGS: The heart size and mediastinal contours are stable. Patient is status post prior CABG and median sternotomy. There is a small right pleural effusion. There is no focal pneumonia or pulmonary edema. The visualized skeletal structures are stable.  IMPRESSION: Small right pleural effusion. No focal pneumonia or pulmonary edema.   Electronically Signed   By: Sherian Rein M.D.   On: 01/30/2013 19:07    ASSESSMENT: right BKA 7 weeks ago with necrosis at stump site-no evidence of  infection.   PLAN:continue with daily saline wet to dry dressings-will eventually need revision but not emergent. Angelica Lawrence W 01/31/2013, 11:14 AM

## 2013-02-01 ENCOUNTER — Inpatient Hospital Stay (HOSPITAL_COMMUNITY): Payer: Medicare Other

## 2013-02-01 DIAGNOSIS — I739 Peripheral vascular disease, unspecified: Secondary | ICD-10-CM

## 2013-02-01 LAB — CBC
Hemoglobin: 10.8 g/dL — ABNORMAL LOW (ref 12.0–15.0)
MCHC: 32.2 g/dL (ref 30.0–36.0)
MCV: 85.5 fL (ref 78.0–100.0)
RBC: 3.92 MIL/uL (ref 3.87–5.11)
RDW: 14.8 % (ref 11.5–15.5)
WBC: 6.9 10*3/uL (ref 4.0–10.5)

## 2013-02-01 LAB — BASIC METABOLIC PANEL
BUN: 35 mg/dL — ABNORMAL HIGH (ref 6–23)
CO2: 28 mEq/L (ref 19–32)
Creatinine, Ser: 1.31 mg/dL — ABNORMAL HIGH (ref 0.50–1.10)
GFR calc Af Amer: 42 mL/min — ABNORMAL LOW (ref >90)
GFR calc non Af Amer: 36 mL/min — ABNORMAL LOW (ref >90)
Potassium: 3.8 mEq/L (ref 3.5–5.1)
Sodium: 136 mEq/L (ref 135–145)

## 2013-02-01 LAB — PROTIME-INR
INR: 6.43 (ref 0.00–1.49)
Prothrombin Time: 53.8 seconds — ABNORMAL HIGH (ref 11.6–15.2)

## 2013-02-01 LAB — GLUCOSE, CAPILLARY
Glucose-Capillary: 123 mg/dL — ABNORMAL HIGH (ref 70–99)
Glucose-Capillary: 172 mg/dL — ABNORMAL HIGH (ref 70–99)

## 2013-02-01 MED ORDER — HYDROCODONE-ACETAMINOPHEN 5-325 MG PO TABS
2.0000 | ORAL_TABLET | ORAL | Status: DC
  Administered 2013-02-01 (×2): 2 via ORAL
  Filled 2013-02-01 (×2): qty 2

## 2013-02-01 MED ORDER — POLYETHYLENE GLYCOL 3350 17 G PO PACK
17.0000 g | PACK | Freq: Every day | ORAL | Status: DC
  Administered 2013-02-01 – 2013-02-02 (×2): 17 g via ORAL
  Filled 2013-02-01 (×4): qty 1

## 2013-02-01 MED ORDER — HYDROCODONE-ACETAMINOPHEN 5-325 MG PO TABS: 2.0000 | ORAL_TABLET | ORAL | Status: DC | PRN

## 2013-02-01 MED ORDER — SENNA 8.6 MG PO TABS
2.0000 | ORAL_TABLET | Freq: Every day | ORAL | Status: DC
  Administered 2013-02-01 – 2013-02-05 (×5): 17.2 mg via ORAL
  Filled 2013-02-01 (×6): qty 2

## 2013-02-01 MED ORDER — HYDROCODONE-ACETAMINOPHEN 5-325 MG PO TABS
2.0000 | ORAL_TABLET | ORAL | Status: DC
  Administered 2013-02-01 – 2013-02-06 (×23): 2 via ORAL
  Filled 2013-02-01 (×24): qty 2

## 2013-02-01 MED ORDER — PHYTONADIONE 5 MG PO TABS
2.5000 mg | ORAL_TABLET | Freq: Once | ORAL | Status: AC
  Administered 2013-02-01: 2.5 mg via ORAL
  Filled 2013-02-01: qty 1

## 2013-02-01 MED ORDER — BISACODYL 10 MG RE SUPP: 10.0000 mg | Freq: Every day | RECTAL | Status: DC | PRN

## 2013-02-01 NOTE — Progress Notes (Signed)
[facility] PROGRESS NOTE  Angelica Lawrence ZOX:[id number] DOB: Oct 30, 1926 DOA: 01/30/2013 PCP: [name], MD  Assessment/Plan  Acute on chronic renal failure likely secondary to dehydration.  Baseline creatinine of 0.8-1, peak creatinine 1.89 at presentation.  Trending down with hydration -  Continue IVF -  Continue to hold diuretics  HTN, BP low normal to mildly elevated -  Restart atenolol with hold parameters -  Continue to hold imdur for now  Left leg DVT 12/10/2012 with supratherapeutic INR.  INR trending down slightly after first dose of vitamin K -  Continue hold warfarin -  Give second dose of vitamin K 2.5mg  -  No evidence of bleeding -  Repeat INR in AM -  CT head neg for hemorrhage  Suprapubic abdominal pain, resolved.  Now having severe buttock pain near her stage 2 sacral decubs -  XR pelvis:  Negative for fracture -  Air mattress -  Foam dressing -  Frequent turning -  Schedule vicodin with hold parameter and use dilaudid for breakthrough pain.    CAD, chest pain free.  Hold a/c.  Not on ASA.   -  Restart atenolol with hold parameter for BP -  Restart statin -  Restart imdur when BP more stable  T2DM, diet controlled and currently hypoglycemic  Chronic iron deficiency anemia, hemoglobin at baseline, restart iron supplementation  PVD s/p amputation by Dr. [name], now with wound dehiscence.  Does not appear infected at this time.  Appreciate orthopedic assistance -  Wound culture  -  No antibiotics   Hypokalemia, likely due to dehydration  -  Oral potassium repletion  Moderate protein calorie malnutrition -  Regular diet -  supplements  Diet:  regular Access:  PIV IVF:  yes Proph:  supratherapeutic warfarin  Code Status: DNR Family Communication: patient alone Disposition Plan: pending improvement in creatinine    Consultants:  Orthopedics, Dr. [name]  Wound care  Procedures:  CT head  XR pelvis  Antibiotics:  None    HPI/Subjective:  Patient states she has severe buttock pain and right leg pain.  She denies difficulty breathing, nausea.  Crying from pain Objective: Filed Vitals:   01/31/13 2100 02/01/13 0500 02/01/13 0918 02/01/13 1319  BP: 119/51 96/57 98/48  102/54  Pulse: 74 73 71 78  Temp: 97.3 F (36.3 C) 97.2 F (36.2 C) 97.5 F (36.4 C) 97.1 F (36.2 C)  TempSrc: Oral Oral Oral Oral  Resp: 18 18 18 16   Height:      Weight: 61.4 kg (135 lb 5.8 oz)     SpO2: 100% 98% 98% 98%    Intake/Output Summary (Last 24 hours) at 02/01/13 1559 Last data filed at 02/01/13 1300  Gross per 24 hour  Intake 2202.5 ml  Output   1300 ml  Net  902.5 ml   Filed Weights   01/30/13 2305 01/31/13 2100  Weight: 60.328 kg (133 lb) 61.4 kg (135 lb 5.8 oz)    Exam:   General:  Thin AAF, No acute distress, mumbled voice, tearful   HEENT:  NCAT, mildly dry MM  Cardiovascular:  RRR, nl S1, S2 no mrg, 2+ pulses, warm extremities  Respiratory:  CTAB, no increased WOB  Abdomen:   NABS, soft, TTP in the suprapubic region without rebound or guarding   MSK:   Normal tone and bulk, no LEE  Neuro:  Grossly intact  Psych:  Oriented to person, place, Nov and Friday  Skin:  Several stage 2 sacral decubitus ulcers  Data Reviewed: Basic Metabolic Panel:  Recent Labs Lab 01/30/13 1730 01/31/13 0511 02/01/13 0550  NA 131* 136 136  K 4.8 3.0* 3.8  CL 94* 97 100  CO2 25 25 28   GLUCOSE 66* 55* 114*  BUN 43* 40* 35*  CREATININE 1.89* 1.72* 1.31*  CALCIUM 8.2* 8.3* 8.3*  MG  --  1.4*  --    Liver Function Tests:  Recent Labs Lab 01/30/13 1730 01/31/13 0511  AST 48* 22  ALT 18 11  ALKPHOS 61 67  BILITOT 0.7 0.8  PROT 6.3 5.9*  ALBUMIN 1.8* 1.7*   No results found for this basename: LIPASE, AMYLASE,  in the last 168 hours No results found for this basename: AMMONIA,  in the last 168 hours CBC:  Recent Labs Lab 01/30/13 1730 01/31/13 0511 02/01/13 0550  WBC 10.8* 8.8 6.9  HGB 10.7*  10.6* 10.8*  HCT 30.5* 30.7* 33.5*  MCV 87.4 86.2 85.5  PLT 233 234 230   Cardiac Enzymes: No results found for this basename: CKTOTAL, CKMB, CKMBINDEX, TROPONINI,  in the last 168 hours BNP (last 3 results)  Recent Labs  10/16/12 2045 12/16/12 2006  PROBNP 1528.0* 18490.0*   CBG:  Recent Labs Lab 01/31/13 0753 01/31/13 0847 01/31/13 1624 02/01/13 0749  GLUCAP 47* 86 150* 123*    Recent Results (from the past 240 hour(s))  MRSA PCR SCREENING     Status: None   Collection Time    01/30/13 11:19 PM      Result Value Range Status   MRSA by PCR NEGATIVE  NEGATIVE Final   Comment:            The GeneXpert MRSA Assay (FDA     approved for NASAL specimens     only), is one component of a     comprehensive MRSA colonization     surveillance program. It is not     intended to diagnose MRSA     infection nor to guide or     monitor treatment for     MRSA infections.  WOUND CULTURE     Status: None   Collection Time    01/31/13 11:04 AM      Result Value Range Status   Specimen Description WOUND   Final   Special Requests Normal RIGHT BKA STUMP   Final   Gram Stain PENDING   Incomplete   Culture     Final   Value: ABUNDANT GRAM NEGATIVE RODS     Performed at [facility]   Report Status PENDING   Incomplete     Studies: Ct Head Wo Contrast  01/31/2013   CLINICAL DATA:  Altered mental status and elevated INR level.  EXAM: CT HEAD WITHOUT CONTRAST  TECHNIQUE: Contiguous axial images were obtained from the base of the skull through the vertex without intravenous contrast.  COMPARISON:  None.  FINDINGS: The brain demonstrates mild periventricular small vessel ischemic changes and diffuse cortical atrophy. The brain demonstrates no evidence of hemorrhage, acute infarction, edema, mass effect, extra-axial fluid collection, hydrocephalus or mass lesion. The skull is unremarkable.  IMPRESSION: No acute findings.   Electronically Signed   By: Irish Lack M.D.   On:  01/31/2013 08:44   Dg Pelvis Portable  02/01/2013   CLINICAL DATA:  No known injury.  Right pelvic pain.  EXAM: PORTABLE PELVIS 1-2 VIEWS  COMPARISON:  CT 09/20/2011  FINDINGS: Mild degenerative changes in the hips bilaterally. No fracture, subluxation or dislocation. Vascular calcifications  noted.  IMPRESSION: No acute bony abnormality.   Electronically Signed   By: Charlett Nose M.D.   On: 02/01/2013 12:39   Dg Chest Port 1 View  01/30/2013   CLINICAL DATA:  Respiratory distress  EXAM: PORTABLE CHEST - 1 VIEW  COMPARISON:  December 16, 2012  FINDINGS: The heart size and mediastinal contours are stable. Patient is status post prior CABG and median sternotomy. There is a small right pleural effusion. There is no focal pneumonia or pulmonary edema. The visualized skeletal structures are stable.  IMPRESSION: Small right pleural effusion. No focal pneumonia or pulmonary edema.   Electronically Signed   By: Sherian Rein M.D.   On: 01/30/2013 19:07    Scheduled Meds: . atenolol  25 mg Oral Daily  . docusate sodium  100 mg Oral BID  . feeding supplement (ENSURE COMPLETE)  237 mL Oral BID BM  . ferrous sulfate  325 mg Oral Q breakfast  . HYDROcodone-acetaminophen  2 tablet Oral Q4H  . multivitamin with minerals  1 tablet Oral Daily  . pantoprazole  40 mg Oral Daily  . polyethylene glycol  17 g Oral Daily  . senna  2 tablet Oral QHS  . simvastatin  40 mg Oral q1800  . traZODone  100 mg Oral QHS  . Warfarin - Pharmacist Dosing Inpatient   Does not apply q1800   Continuous Infusions: . dextrose 5 % and 0.45% NaCl 1,000 mL (02/01/13 0653)    Principal Problem:   Acute on chronic renal failure Active Problems:   HYPERTENSION   CORONARY ARTERY DISEASE   DM (diabetes mellitus)   Osteomyelitis of ankle or foot, right, acute   DVT (deep venous thrombosis)   Long term (current) use of anticoagulants    Time spent: 30 min    Quintrell Baze, [facility]  [facility] Pager [phone]. If 7PM-7AM,  please contact night-coverage at [email or web address], password [facility] 02/01/2013, 3:59 PM  LOS: 2 days

## 2013-02-01 NOTE — Progress Notes (Signed)
Coumadin per Rx  AC: Warfarin for hx DVT in 10/14 - INR 8.57 >> 8.09 > 6.43, Hgb 10.8, plts 230 K, last dose 11/20, no bleeding noted. Given vitamin K on 11/21 and 11/23  INR goal 2-3  Plan: 1. No coumadin tonight 2. Daily INR

## 2013-02-01 NOTE — Consult Note (Addendum)
WOC consult requested for right BKA wound prior to ortho team involvement.  Dr Cleophas Dunker of orthopedics has seen pt and ordered topical treatment. Please refer to this team for further plan of care and re-consult if further assistance is needed.  Thank-you,  Cammie Mcgee MSN, RN, CWOCN, [place], CNS [phone]

## 2013-02-01 NOTE — Progress Notes (Signed)
CRITICAL VALUE ALERT  Critical value received: 6.43 -INR  Date of notification:  02/02/12  Time of notification:  0739  Critical value read back:YES  Nurse who received alert:  Daesean Lazarz  MD notified (1st page):  Dr.M.Short  Time of first page:  0737  MD notified (2nd page):NO  Time of second page:NO  Responding MD:  Dr Malachi Bonds  Time MD responded: [phone]

## 2013-02-02 DIAGNOSIS — E119 Type 2 diabetes mellitus without complications: Secondary | ICD-10-CM

## 2013-02-02 LAB — BASIC METABOLIC PANEL
BUN: 31 mg/dL — ABNORMAL HIGH (ref 6–23)
Creatinine, Ser: 1.18 mg/dL — ABNORMAL HIGH (ref 0.50–1.10)
GFR calc Af Amer: 48 mL/min — ABNORMAL LOW (ref >90)
GFR calc non Af Amer: 41 mL/min — ABNORMAL LOW (ref >90)
Sodium: 133 mEq/L — ABNORMAL LOW (ref 135–145)

## 2013-02-02 LAB — CBC
MCHC: 34.5 g/dL (ref 30.0–36.0)
Platelets: 184 10*3/uL (ref 150–400)
RDW: 14.6 % (ref 11.5–15.5)

## 2013-02-02 LAB — MAGNESIUM: Magnesium: 1.4 mg/dL — ABNORMAL LOW (ref 1.5–2.5)

## 2013-02-02 LAB — PROTIME-INR
INR: 6.32 (ref 0.00–1.49)
Prothrombin Time: 53.1 seconds — ABNORMAL HIGH (ref 11.6–15.2)

## 2013-02-02 MED ORDER — ATENOLOL 12.5 MG HALF TABLET
12.5000 mg | ORAL_TABLET | Freq: Every day | ORAL | Status: DC
  Administered 2013-02-04 – 2013-02-05 (×2): 12.5 mg via ORAL
  Filled 2013-02-02 (×4): qty 1

## 2013-02-02 MED ORDER — PHYTONADIONE 5 MG PO TABS
2.5000 mg | ORAL_TABLET | Freq: Once | ORAL | Status: AC
  Administered 2013-02-02: 2.5 mg via ORAL
  Filled 2013-02-02: qty 1

## 2013-02-02 MED ORDER — POTASSIUM CHLORIDE 10 MEQ/100ML IV SOLN
10.0000 meq | INTRAVENOUS | Status: AC
  Administered 2013-02-02 (×2): 10 meq via INTRAVENOUS
  Filled 2013-02-02 (×2): qty 100

## 2013-02-02 MED ORDER — SILVER SULFADIAZINE 1 % EX CREA
TOPICAL_CREAM | Freq: Every day | CUTANEOUS | Status: DC
  Administered 2013-02-02: 1 via TOPICAL
  Administered 2013-02-03 – 2013-02-04 (×2): via TOPICAL
  Filled 2013-02-02: qty 85

## 2013-02-02 MED ORDER — POTASSIUM CHLORIDE CRYS ER 20 MEQ PO TBCR
40.0000 meq | EXTENDED_RELEASE_TABLET | Freq: Two times a day (BID) | ORAL | Status: AC
  Administered 2013-02-02 (×2): 40 meq via ORAL
  Filled 2013-02-02 (×2): qty 2

## 2013-02-02 MED ORDER — MAGNESIUM SULFATE 40 MG/ML IJ SOLN
2.0000 g | Freq: Once | INTRAMUSCULAR | Status: AC
  Administered 2013-02-02: 2 g via INTRAVENOUS
  Filled 2013-02-02: qty 50

## 2013-02-02 NOTE — Progress Notes (Signed)
PHARMACY FOLLOW UP NOTE   Pharmacy Consult for : Coumadin Indication: Recent DVT  Dosing Weight: 64 kg  Labs:  Recent Labs  01/30/13 1730 01/30/13 2147 01/31/13 0511 02/01/13 0550 02/02/13 0506  HGB 10.7*  --  10.6* 10.8* 9.8*  HCT 30.5*  --  30.7* 33.5* 28.4*  PLT 233  --  234 230 184  LABPROT  --  67.0* 64.2* 53.8* 53.1*  INR  --  8.57* 8.09* 6.43* 6.32*  CREATININE 1.89*  --  1.72* 1.31* 1.18*   Lab Results  Component Value Date   INR 6.32* 02/02/2013   INR 6.43* 02/01/2013   INR 8.09* 01/31/2013   Estimated Creatinine Clearance: 34.5 ml/min (by C-G formula based on Cr of 1.18).  Pertinent Medications:  Scheduled:  . atenolol  25 mg Oral Daily  . docusate sodium  100 mg Oral BID  . feeding supplement (ENSURE COMPLETE)  237 mL Oral BID BM  . ferrous sulfate  325 mg Oral Q breakfast  . HYDROcodone-acetaminophen  2 tablet Oral Q4H while awake  . magnesium sulfate 1 - 4 g bolus IVPB  2 g Intravenous Once  . multivitamin with minerals  1 tablet Oral Daily  . pantoprazole  40 mg Oral Daily  . polyethylene glycol  17 g Oral Daily  . potassium chloride  10 mEq Intravenous Q1 Hr x 2  . potassium chloride  40 mEq Oral BID  . senna  2 tablet Oral QHS  . silver sulfADIAZINE   Topical Daily  . simvastatin  40 mg Oral q1800  . traZODone  100 mg Oral QHS  . Warfarin - Pharmacist Dosing Inpatient   Does not apply q1800    Assessment:  79 y/o female on Coumadin PTA for recent DVT.  Patient has been SUPRAtherapeutic since admission and has now received 3 doses of Vitamin K 2.5 mg po.  INR today 6.32.  H/H stable with No bleeding complications noted   Goal:  INR 2-3   Plan: 1. Vitamin K 2.5 mg po x 1 today [completed] 2. No Coumadin today. 3. Daily INR's, CBC.  Monitor for bleeding complications.    Makinzey Banes, Deetta Perla.D 02/02/2013, 11:13 AM

## 2013-02-02 NOTE — Progress Notes (Signed)
CRITICAL VALUE ALERT  Critical value received: INR 6.34 Date of notification: 02/02/13 Time of notification:  Critical value read back: Yes Nurse who received alert: Waymon Budge RN MD notified (1st page):  [facility] on call Time of first page: 0640 MD notified (2nd page):  Time of second page:  Responding MD:  Time MD responded:

## 2013-02-02 NOTE — Plan of Care (Signed)
Problem: Acute Rehab PT Goals(only PT should resolve) Goal: Pt Will Transfer Bed To Chair/Chair To Bed Pt is total (A) at home; with hoyer lift.

## 2013-02-02 NOTE — Progress Notes (Signed)
Physical Therapy Treatment Patient Details Name: Angelica Lawrence MRN: [id number] DOB: 06-30-1926 Today's Date: 02/02/2013 Time: 4098-1191 PT Time Calculation (min): 34 min  PT Assessment / Plan / Recommendation  History of Present Illness pt is a 79 y.o. female who was recently D/C from nursing home to home with family. Pt adm initially secondary to c/o back pain; daughter reported pt has been more confused and hallucinating recently. Pt being treated for dehydration.    PT Comments   Pt tolerated transfer to chair with 2 person total (A). Telephoned family to confirm PLOF and details of how the family plans to transfer her upon D/C home. Daughter reports the family has been using a hoyer lift to transfer pt to bed <> recliner. Discussed with daughter proper positioning and pressure relief frequency to prevent pressure ulcers, daughter verbalized understanding. Will cont to follow up with pt to increase sitting balance and tolerance to improve quality of life and increase activity tolerance.   Follow Up Recommendations  Supervision/Assistance - 24 hour;Other (comment);Home health PT     Does the patient have the potential to tolerate intense rehabilitation     Barriers to Discharge        Equipment Recommendations  Hospital bed;Other (comment)    Recommendations for Other Services    Frequency Min 2X/week   Progress towards PT Goals Progress towards PT goals: Progressing toward goals  Plan Current plan remains appropriate    Precautions / Restrictions Precautions Precautions: Fall Restrictions Weight Bearing Restrictions: No   Pertinent Vitals/Pain C/o pain in low back. patient repositioned for comfort     Mobility  Bed Mobility Bed Mobility: Supine to Sit;Sitting - Scoot to [place] of Bed;Rolling Right;Rolling Left Rolling Right: 4: Min assist;With rail Rolling Left: 4: Min assist;With rail Left Sidelying to Sit: 2: Max assist;HOB elevated;With rails Supine to Sit: HOB  elevated;1: +2 Total assist Supine to Sit: Patient Percentage: 0% Sitting - Scoot to Edge of Bed: 1: +1 Total assist Sit to Supine: 2: Max assist;HOB flat Details for Bed Mobility Assistance: pt requires incr (A) and has difficulty manuevering in bed secondary to generalized weakness and pain; max cues and encouragement for initiation, sequencing and hand placement Transfers Transfers: Lateral/Scoot Transfers Lateral/Scoot Transfers: 1: +2 Total assist;From elevated surface;With armrests removed Lateral Transfers: Patient Percentage: 0% Details for Transfer Assistance: total A + 2, used drop arm chair for transfer from bed - chair. Used pads to slide pt over into recliner Ambulation/Gait Ambulation/Gait Assistance: Not tested (comment) Stairs: No Wheelchair Mobility Wheelchair Mobility: No         PT Diagnosis:    PT Problem List:   PT Treatment Interventions:     PT Goals (current goals can now be found in the care plan section) Acute Rehab PT Goals Patient Stated Goal: to go back home PT Goal Formulation: With patient Time For Goal Achievement: 02/14/13 Potential to Achieve Goals: Fair  Visit Information  Last PT Received On: 02/02/13 Assistance Needed: +2 PT/OT Co-Evaluation/Treatment: Yes History of Present Illness: pt is a 79 y.o. female who was recently D/C from nursing home to home with family. Pt adm initially secondary to c/o back pain; daughter reported pt has been more confused and hallucinating recently. Pt being treated for dehydration.     Subjective Data  Subjective: pt lying supine; agreeable to therapy and get oob to chair  Patient Stated Goal: to go back home   Cognition  Cognition Arousal/Alertness: Awake/alert Behavior During Therapy: [facility] for tasks assessed/performed  Overall Cognitive Status: No family/caregiver present to determine baseline cognitive functioning Area of Impairment: Memory;Attention;Problem solving Orientation Level: Disoriented  to;Situation;Place;Time Current Attention Level: Selective Memory: Decreased short-term memory Following Commands: Follows one step commands inconsistently;Follows one step commands with increased time Problem Solving: Slow processing;Decreased initiation;Difficulty sequencing;Requires verbal cues;Requires tactile cues General Comments: pt inconsistent with information given regarding PLOF and level of A at home, pt became tearful during session while seated EOB and in recliner    Balance  Balance Balance Assessed: Yes Static Sitting Balance Static Sitting - Balance Support: Bilateral upper extremity supported;Feet unsupported Static Sitting - Level of Assistance: 5: Stand by assistance;Other (comment) Static Sitting - Comment/# of Minutes: pt tends to thrust backwards; requries max encouragement and cues for upright position and bracing; pt c/o pain in low back area  Dynamic Sitting Balance Dynamic Sitting - Balance Support: Bilateral upper extremity supported;During functional activity Dynamic Sitting - Level of Assistance: 4: Min assist  End of Session PT - End of Session Equipment Utilized During Treatment: Gait belt Activity Tolerance: Patient limited by pain;Patient limited by fatigue Patient left: in chair;with call bell/phone within reach Nurse Communication: Mobility status;Need for lift equipment   GP     Donell Sievert, [place] [phone] 02/02/2013, 3:24 PM

## 2013-02-02 NOTE — Progress Notes (Signed)
[facility] PROGRESS NOTE  Angelica Lawrence WGN:[id number] DOB: [birth year] DOA: 01/30/2013 PCP: [name], MD  Assessment/Plan  Acute on chronic renal failure likely secondary to dehydration.  Baseline creatinine of 0.8-1, peak creatinine 1.89 at presentation.  Trending down with hydration -  Continue IVF -  Continue to hold diuretics  HTN, BP low normal to mildly elevated -  Reduce dose of atenolol with hold parameters -  Continue to hold imdur for now  Left leg DVT 12/10/2012 with supratherapeutic INR.  INR trending down slightly after first dose of vitamin K -  Continue hold warfarin -  Given third dose of vitamin K 2.5mg  -  No evidence of bleeding -  Repeat INR in AM -  CT head neg for hemorrhage  Suprapubic and buttock pain, resolving.   -  XR pelvis:  Negative for fracture -  Air mattress -  Foam dressing -  Frequent turning -  Continue scheduled vicodin with hold parameter  CAD, chest pain free.  Hold a/c.  Not on ASA.   -  Reduce dose of BB -  Continue statin -  Restart imdur when BP more stable  T2DM, diet controlled and currently hypoglycemic  Chronic iron deficiency anemia, hemoglobin at baseline, restart iron supplementation  PVD s/p amputation by Dr. [name], now with wound dehiscence and necrotic appearing debris.  Does not appear infected at this time.  Appreciate orthopedic assistance and will continue silver dressing -  Wound culture growing Staph and Klebsiella -  No antibiotics   Hypokalemia, likely due to dehydration, oral and IV KCl  Hypomagnesemia, due to malnutrition.  IV magnesium sulfate  Moderate protein calorie malnutrition -  Regular diet -  supplements  Diet:  regular Access:  PIV IVF:  yes Proph:  supratherapeutic warfarin  Code Status: DNR Family Communication: patient alone Disposition Plan: pending improvement in creatinine, tolerating enough liquids to stay hydrated, blood pressure stable, pain controlled, electrolytes near  normal, no signs of wound infection   Consultants:  Orthopedics, Dr. [name]  Wound care  Procedures:  CT head  XR pelvis  Antibiotics:  None   HPI/Subjective:  Patient states she has severe buttock pain and right leg pain.  She denies difficulty breathing, nausea.  Crying from pain Objective: Filed Vitals:   02/01/13 2100 02/02/13 0500 02/02/13 0928 02/02/13 1414  BP: 114/48 90/50 93/47  109/43  Pulse: 76 76 74 71  Temp: 98 F (36.7 C) 98.2 F (36.8 C) 98.6 F (37 C) 98.2 F (36.8 C)  TempSrc: Oral Oral Oral Oral  Resp: 18 16 18 18   Height:      Weight: 64.32 kg (141 lb 12.8 oz)     SpO2: 98% 100% 100% 100%    Intake/Output Summary (Last 24 hours) at 02/02/13 1630 Last data filed at 02/02/13 1414  Gross per 24 hour  Intake 3361.25 ml  Output      0 ml  Net 3361.25 ml   Filed Weights   01/30/13 2305 01/31/13 2100 02/01/13 2100  Weight: 60.328 kg (133 lb) 61.4 kg (135 lb 5.8 oz) 64.32 kg (141 lb 12.8 oz)    Exam:   General:  Thin AAF, No acute distress  HEENT:  NCAT, MMM  Cardiovascular:  RRR, nl S1, S2 no mrg, 2+ pulses, warm extremities  Respiratory:  CTAB, no increased WOB  Abdomen:   NABS, soft, NT/ND  MSK:   Normal tone and bulk, no LEE  Neuro:  Grossly intact  Psych:  Oriented to  person, place, Nov and Friday  Skin:  Several stage 2 sacral decubitus ulcers, right leg with increased somewhat necrotic material at the lateral borders, covered with silvadene and dry dressing.  No odor, surrounding erythema, or induration.    Data Reviewed: Basic Metabolic Panel:  Recent Labs Lab 01/30/13 1730 01/31/13 0511 02/01/13 0550 02/02/13 0506  NA 131* 136 136 133*  K 4.8 3.0* 3.8 2.9*  CL 94* 97 100 97  CO2 25 25 28 25   GLUCOSE 66* 55* 114* 164*  BUN 43* 40* 35* 31*  CREATININE 1.89* 1.72* 1.31* 1.18*  CALCIUM 8.2* 8.3* 8.3* 7.7*  MG  --  1.4*  --  1.4*   Liver Function Tests:  Recent Labs Lab 01/30/13 1730 01/31/13 0511  AST 48* 22   ALT 18 11  ALKPHOS 61 67  BILITOT 0.7 0.8  PROT 6.3 5.9*  ALBUMIN 1.8* 1.7*   No results found for this basename: LIPASE, AMYLASE,  in the last 168 hours No results found for this basename: AMMONIA,  in the last 168 hours CBC:  Recent Labs Lab 01/30/13 1730 01/31/13 0511 02/01/13 0550 02/02/13 0506  WBC 10.8* 8.8 6.9 7.7  HGB 10.7* 10.6* 10.8* 9.8*  HCT 30.5* 30.7* 33.5* 28.4*  MCV 87.4 86.2 85.5 86.6  PLT 233 234 230 184   Cardiac Enzymes: No results found for this basename: CKTOTAL, CKMB, CKMBINDEX, TROPONINI,  in the last 168 hours BNP (last 3 results)  Recent Labs  10/16/12 2045 12/16/12 2006  PROBNP 1528.0* 18490.0*   CBG:  Recent Labs Lab 01/31/13 0847 01/31/13 1624 02/01/13 0749 02/01/13 1608 02/02/13 0745  GLUCAP 86 150* 123* 172* 166*    Recent Results (from the past 240 hour(s))  MRSA PCR SCREENING     Status: None   Collection Time    01/30/13 11:19 PM      Result Value Range Status   MRSA by PCR NEGATIVE  NEGATIVE Final   Comment:            The GeneXpert MRSA Assay (FDA     approved for NASAL specimens     only), is one component of a     comprehensive MRSA colonization     surveillance program. It is not     intended to diagnose MRSA     infection nor to guide or     monitor treatment for     MRSA infections.  WOUND CULTURE     Status: None   Collection Time    01/31/13 11:04 AM      Result Value Range Status   Specimen Description WOUND   Final   Special Requests Normal RIGHT BKA STUMP   Final   Gram Stain     Final   Value: NO WBC SEEN     NO SQUAMOUS EPITHELIAL CELLS SEEN     FEW GRAM NEGATIVE RODS     Performed at [facility]   Culture     Final   Value: ABUNDANT KLEBSIELLA PNEUMONIAE     ABUNDANT STAPHYLOCOCCUS AUREUS     Note: RIFAMPIN AND GENTAMICIN SHOULD NOT BE USED AS SINGLE DRUGS FOR TREATMENT OF STAPH INFECTIONS.     Performed at [facility]   Report Status PENDING   Incomplete   Organism ID,  Bacteria KLEBSIELLA PNEUMONIAE   Final     Studies: Dg Pelvis Portable  02/01/2013   CLINICAL DATA:  No known injury.  Right pelvic pain.  EXAM: PORTABLE PELVIS  1-2 VIEWS  COMPARISON:  CT 09/20/2011  FINDINGS: Mild degenerative changes in the hips bilaterally. No fracture, subluxation or dislocation. Vascular calcifications noted.  IMPRESSION: No acute bony abnormality.   Electronically Signed   By: Charlett Nose M.D.   On: 02/01/2013 12:39    Scheduled Meds: . atenolol  25 mg Oral Daily  . docusate sodium  100 mg Oral BID  . feeding supplement (ENSURE COMPLETE)  237 mL Oral BID BM  . ferrous sulfate  325 mg Oral Q breakfast  . HYDROcodone-acetaminophen  2 tablet Oral Q4H while awake  . multivitamin with minerals  1 tablet Oral Daily  . pantoprazole  40 mg Oral Daily  . polyethylene glycol  17 g Oral Daily  . potassium chloride  40 mEq Oral BID  . senna  2 tablet Oral QHS  . silver sulfADIAZINE   Topical Daily  . simvastatin  40 mg Oral q1800  . traZODone  100 mg Oral QHS  . Warfarin - Pharmacist Dosing Inpatient   Does not apply q1800   Continuous Infusions: . dextrose 5 % and 0.45% NaCl 75 mL/hr (02/02/13 0930)    Principal Problem:   Acute on chronic renal failure Active Problems:   HYPERTENSION   CORONARY ARTERY DISEASE   DM (diabetes mellitus)   Osteomyelitis of ankle or foot, right, acute   DVT (deep venous thrombosis)   Long term (current) use of anticoagulants    Time spent: 30 min    Wileen Duncanson, [facility]  [facility] Pager [phone]. If 7PM-7AM, please contact night-coverage at [email or web address], password [facility] 02/02/2013, 4:30 PM  LOS: 3 days

## 2013-02-02 NOTE — Evaluation (Signed)
Occupational Therapy Evaluation Patient Details Name: Angelica Lawrence MRN: [id number] DOB: 12-16-1926 Today's Date: 02/02/2013 Time: 4098-1191 OT Time Calculation (min): 21 min  OT Assessment / Plan / Recommendation History of present illness pt is a 79 y.o. female who was recently D/C from nursing home to home with family. Pt adm initially secondary to c/o back pain; daughter reported pt has been more confused and hallucinating recently. Pt being treated for dehydration.    Clinical Impression   Pt is total A with ADLs and ADL mobility. PT spoke with pt's family and they have a hoyer lift at home for transferring her to w/c, bed and BSC. Pt is fearful, confused and a poor historian. Pt is able to feed herself and perform simple grooming tasks with set up/sup. Pt not appropriate for acute OT services at this time. Pt to continue with acute PT services for sitting tolerance/balance. Family plans for pt to return home and continue total care for her    OT Assessment  Patient does not need any further OT services    Follow Up Recommendations  No OT follow up;Supervision/Assistance - 24 hour    Barriers to Discharge  none    Equipment Recommendations  None recommended by OT    Recommendations for Other Services  none  Frequency       Precautions / Restrictions Precautions Precautions: Fall Restrictions Weight Bearing Restrictions: No   Pertinent Vitals/Pain Did not rate pain, but crying and moaning during mobility, pt rubbing residual limbs    ADL  Eating/Feeding: Performed;Set up Grooming: Performed;Set up;Min guard;Minimal assistance;Wash/dry hands;Wash/dry face Where Assessed - Grooming: Supported sitting Upper Body Bathing: +1 Total assistance Lower Body Bathing: +1 Total assistance Upper Body Dressing: +1 Total assistance Lower Body Dressing: +1 Total assistance Toilet Transfer: +1 Total assistance;Simulated Toileting - Clothing Manipulation and Hygiene: +1 Total  assistance Equipment Used: Gait belt Transfers/Ambulation Related to ADLs: total A + 2, used drop arm chair for transfer from bed - chair. Used pads to slide pt over into recliner    OT Diagnosis:    OT Problem List:   OT Treatment Interventions:     OT Goals(Current goals can be found in the care plan section) Acute Rehab OT Goals Patient Stated Goal: to go back home  Visit Information  Last OT Received On: 02/02/13 Assistance Needed: +2 PT/OT Co-Evaluation/Treatment: Yes History of Present Illness: pt is a 79 y.o. female who was recently D/C from nursing home to home with family. Pt adm initially secondary to c/o back pain; daughter reported pt has been more confused and hallucinating recently. Pt being treated for dehydration.        Prior Functioning     Home Living Family/patient expects to be discharged to:: Private residence Living Arrangements: Children Available Help at Discharge: Family;Available 24 hours/day Type of Home: House Home Access: Ramped entrance Home Layout: One level Home Equipment: Walker - 2 wheels;Wheelchair - manual;Bedside commode Additional Comments: Pt spoke with family and they have a hoyer lift at home for her and she is dep with ADLs and transfers to [facility] at home Prior Function Level of Independence: Needs assistance Gait / Transfers Assistance Needed: total A for transfers bed<> WC,BSC ADL's / Homemaking Assistance Needed: total A Communication Communication: No difficulties Dominant Hand: Right         Vision/Perception Vision - History Baseline Vision: Wears glasses all the time Patient Visual Report: No change from baseline Perception Perception: Within Functional Limits   Cognition  Cognition  Arousal/Alertness: Awake/alert Behavior During Therapy: WFL for tasks assessed/performed Overall Cognitive Status: No family/caregiver present to determine baseline cognitive functioning Area of Impairment: Orientation;Memory;Following  commands;Problem solving;Attention Orientation Level: Disoriented to;Situation;Place;Time Memory: Decreased short-term memory Following Commands: Follows one step commands inconsistently;Follows one step commands with increased time Problem Solving: Slow processing;Decreased initiation;Difficulty sequencing;Requires verbal cues;Requires tactile cues General Comments: pt inconsistent with information given regarding PLOF and level of A at home, pt became tearful during session while seated EOB and in recliner    Extremity/Trunk Assessment Upper Extremity Assessment Upper Extremity Assessment: Generalized weakness Lower Extremity Assessment Lower Extremity Assessment: Defer to PT evaluation Cervical / Trunk Assessment Cervical / Trunk Assessment: Kyphotic     Mobility Bed Mobility Bed Mobility: Supine to Sit;Sitting - Scoot to Edge of Bed Supine to Sit: HOB elevated;1: +2 Total assist Sitting - Scoot to Edge of Bed: 1: +1 Total assist Details for Bed Mobility Assistance: pt with difficulty manuevering in bed secondary to generalized weakness; pt required max encouragement and cues for hand placement, initiation and sequencing, required 2 person A to get to/scoot to EOB Transfers Transfers: Not assessed Details for Transfer Assistance: total A + 2, used drop arm chair for transfer from bed - chair. Used pads to slide pt over into recliner     Exercise     Balance Static Sitting Balance Static Sitting - Balance Support: Bilateral upper extremity supported;Feet unsupported Static Sitting - Level of Assistance: 5: Stand by assistance;Other (comment) (min guard A) Dynamic Sitting Balance Dynamic Sitting - Balance Support: Bilateral upper extremity supported;During functional activity Dynamic Sitting - Level of Assistance: 4: Min assist   End of Session OT - End of Session Equipment Utilized During Treatment: Gait belt Activity Tolerance: Patient limited by fatigue;Patient limited by  pain;Other (comment) (pt became tearful) Patient left: in chair;with call bell/phone within reach Nurse Communication: Mobility status;Need for lift equipment  GO     Galen Manila 02/02/2013, 3:17 PM

## 2013-02-02 NOTE — Progress Notes (Signed)
Patient ID: Angelica Lawrence, female   DOB: 23-Mar-1926, 79 y.o.   MRN: [id number] Ischemic changes right transtibial amputation. Examination there is some mild ischemic changes with some healthy granulation tissue. We'll continue to follow this expectantly. No indication for surgical intervention at this time. Will have Silvadene dressing changes started to the right transtibial amputation residual limb.

## 2013-02-02 NOTE — Progress Notes (Signed)
NUTRITION FOLLOW UP  Intervention:   1.  Supplements; continue Ensure Complete po BID, each supplement provides 350 kcal and 13 grams of protein. 2.  MVI daily 3.  Nutrition-related labs; check phosphorus level as pt with poor appetite and significant weight loss and may be at risk for refeeding syndrome. Already has low potassium and magnesium, suspect phosphorus may be low as well. Recommend daily monitoring of potassium, magnesium, and phosphorus x 3 days and replete as needed.   Nutrition Dx:   Unintended wt loss, ongoing.   Monitor:   1.  Food/Beverage; pt meeting >/=90% estimated needs with tolerance.  Met, pt's intake is improving and she is consuming supplements as ordered.  2.  Wt/wt change; monitor trends  Assessment:   Pt with history of diabetes mellitus, hypertension, coronary artery disease, recent DVT, recent amputation for osteomyelitis of the right leg, recent treatment for cellulitis of the right leg. Admitted with back pain, confusion and hallucination from the patient also she hasn't had urinated since last 2 days.  Pt assessed by RD (11/22) and found to meet criteria for severe malnutrition of chronic illness.  Pt's PO intake has improved from 30% to 100% of meals.  She is also consuming Ensure Complete BID.   Note hypomagnesium and hypokalemia.   Pt with ischemic changes to amputation site.  Ongoing monitoring of intake and care plan.   Height: Ht Readings from Last 1 Encounters:  01/30/13 5\' 7"  (1.702 m)    Weight Status:   Wt Readings from Last 1 Encounters:  02/01/13 141 lb 12.8 oz (64.32 kg)    Re-estimated needs:  Kcal: 1650-1850 Protein: 65-85g Fluid: >1.8 L/day  Skin:  Stage 2 sacral pressure ulcer Right and left BKA  Diet Order: Dysphagia 3, thin   Intake/Output Summary (Last 24 hours) at 02/02/13 1416 Last data filed at 02/02/13 1414  Gross per 24 hour  Intake 3006.25 ml  Output      0 ml  Net 3006.25 ml    Last BM:  11/23   Labs:   Recent Labs Lab 01/30/13 1730 01/31/13 0511 02/01/13 0550 02/02/13 0506  NA 131* 136 136 133*  K 4.8 3.0* 3.8 2.9*  CL 94* 97 100 97  CO2 25 25 28 25   BUN 43* 40* 35* 31*  CREATININE 1.89* 1.72* 1.31* 1.18*  CALCIUM 8.2* 8.3* 8.3* 7.7*  MG  --  1.4*  --  1.4*  GLUCOSE 66* 55* 114* 164*    CBG (last 3)   Recent Labs  02/01/13 0749 02/01/13 1608 02/02/13 0745  GLUCAP 123* 172* 166*    Scheduled Meds: . atenolol  25 mg Oral Daily  . docusate sodium  100 mg Oral BID  . feeding supplement (ENSURE COMPLETE)  237 mL Oral BID BM  . ferrous sulfate  325 mg Oral Q breakfast  . HYDROcodone-acetaminophen  2 tablet Oral Q4H while awake  . multivitamin with minerals  1 tablet Oral Daily  . pantoprazole  40 mg Oral Daily  . polyethylene glycol  17 g Oral Daily  . potassium chloride  40 mEq Oral BID  . senna  2 tablet Oral QHS  . silver sulfADIAZINE   Topical Daily  . simvastatin  40 mg Oral q1800  . traZODone  100 mg Oral QHS  . Warfarin - Pharmacist Dosing Inpatient   Does not apply q1800    Continuous Infusions: . dextrose 5 % and 0.45% NaCl 75 mL/hr (02/02/13 0930)    Loyce Dys,  MS RD LDN Clinical Inpatient Dietitian Pager: [phone] Weekend/After hours pager: [phone]

## 2013-02-03 DIAGNOSIS — E43 Unspecified severe protein-calorie malnutrition: Secondary | ICD-10-CM

## 2013-02-03 LAB — BASIC METABOLIC PANEL
BUN: 27 mg/dL — ABNORMAL HIGH (ref 6–23)
CO2: 22 mEq/L (ref 19–32)
Chloride: 100 mEq/L (ref 96–112)
Creatinine, Ser: 1.03 mg/dL (ref 0.50–1.10)
GFR calc non Af Amer: 49 mL/min — ABNORMAL LOW (ref >90)
Glucose, Bld: 188 mg/dL — ABNORMAL HIGH (ref 70–99)
Potassium: 4.4 mEq/L (ref 3.5–5.1)

## 2013-02-03 LAB — WOUND CULTURE: Gram Stain: NONE SEEN

## 2013-02-03 LAB — CBC
HCT: 31.5 % — ABNORMAL LOW (ref 36.0–46.0)
Hemoglobin: 11 g/dL — ABNORMAL LOW (ref 12.0–15.0)
MCH: 30 pg (ref 26.0–34.0)
MCHC: 34.9 g/dL (ref 30.0–36.0)
MCV: 85.8 fL (ref 78.0–100.0)
RBC: 3.67 MIL/uL — ABNORMAL LOW (ref 3.87–5.11)

## 2013-02-03 LAB — PROTIME-INR
INR: 5.58 (ref 0.00–1.49)
Prothrombin Time: 48.2 seconds — ABNORMAL HIGH (ref 11.6–15.2)

## 2013-02-03 MED ORDER — PHYTONADIONE 5 MG PO TABS
5.0000 mg | ORAL_TABLET | Freq: Once | ORAL | Status: AC
  Administered 2013-02-03: 5 mg via ORAL
  Filled 2013-02-03: qty 1

## 2013-02-03 MED ORDER — MEGESTROL ACETATE 400 MG/10ML PO SUSP
400.0000 mg | Freq: Every day | ORAL | Status: DC
  Administered 2013-02-03 – 2013-02-06 (×4): 400 mg via ORAL
  Filled 2013-02-03 (×4): qty 10

## 2013-02-03 MED ORDER — POLYETHYLENE GLYCOL 3350 17 G PO PACK
17.0000 g | PACK | Freq: Every day | ORAL | Status: DC | PRN
  Administered 2013-02-06: 17 g via ORAL
  Filled 2013-02-03: qty 1

## 2013-02-03 NOTE — Progress Notes (Signed)
CRITICAL VALUE ALERT  Critical value received: INR=6.46 Date of notification:  02/03/13  Time of notification:  06:43  Critical value read back:yes  Nurse who received alert:  Justin Mend  MD notified (1st page):  Dr. Izola Price  Time of first page:  06:57  MD notified (2nd page):  Time of second page:  Responding MD:  Dr. Izola Price  Time MD responded:  06:57

## 2013-02-03 NOTE — Progress Notes (Signed)
   CARE MANAGEMENT NOTE 02/03/2013  Patient:  Angelica Lawrence, Angelica Lawrence   Account Number:  [id number]  Date Initiated:  02/01/2013  Documentation initiated by:  [facility]  Subjective/Objective Assessment:     Action/Plan:   active with Genevieve Norlander   Anticipated DC Date:     Anticipated DC Plan:  HOME W HOME HEALTH SERVICES      DC Planning Services  CM consult      Choice offered to / List presented to:             Status of service:  In process, will continue to follow Medicare Important Message given?   (If response is "NO", the following Medicare IM given date fields will be blank) Date Medicare IM given:   Date Additional Medicare IM given:    Discharge Disposition:    Per UR Regulation:    If discussed at Long Length of Stay Meetings, dates discussed:    Comments:  Contact: daughter Angelica Lawrence  [phone]  02/03/2013   [address] RN, [place]  [phone] spoke with daughter Angelica Lawrence regarding discharge planning. She and the family take care of her at home since discharge from SNF on 01/09/2013. They have hospital bed, hoyer lift and transfer her to/from bed to chair. They provide all medical treatments. Her plan is to discharge to home when stable. NCM to continue to follow for discharge plans.

## 2013-02-03 NOTE — Progress Notes (Signed)
PHARMACY FOLLOW UP NOTE   Pharmacy Consult for : Coumadin Indication: Recent DVT  Dosing Weight: 64 kg  Labs:  Recent Labs  02/01/13 0550 02/02/13 0506 02/03/13 0555  HGB 10.8* 9.8* 11.0*  HCT 33.5* 28.4* 31.5*  PLT 230 184 174  LABPROT 53.8* 53.1* 54.0*  INR 6.43* 6.32* 6.46*  CREATININE 1.31* 1.18* 1.03   Lab Results  Component Value Date   INR 6.46* 02/03/2013   INR 6.32* 02/02/2013   INR 6.43* 02/01/2013    Estimated Creatinine Clearance: 39.5 ml/min (by C-G formula based on Cr of 1.03).  Pertinent Medications:  Scheduled:  . ferrous sulfate  325 mg Oral Q breakfast  . phytonadione  5 mg Oral Once  . simvastatin  40 mg Oral q1800   Anti-infectives   None      Assessment:  79 y/o female on Coumadin PTA for recent DVT. Patient has been SUPRAtherapeutic since admission and has now received 3 doses of Vitamin K 2.5 mg po.  INR up today 6.46 after receiving multilple doses of Vit K and NO Coumadin since admission.. H/H stable with No bleeding complications noted   No drug interactions or lab interferences identified.  Goal:  INR 2-3   Plan: 1. Vitamin K 5 mg po now.  [done] 2. Repeat INR 1800 pm.   3. Repeat Vitamin K if no response from this morning's dose. 4. Daily INR's, CBC.  Monitor for bleeding complications.    Korynne Dols, Deetta Perla.D 02/03/2013, 8:28 AM

## 2013-02-03 NOTE — Progress Notes (Signed)
Pharmacy Consult - Elevated INR  INR continues to be elevated at 5.58  Plan: 1) Repeat Vitamin K at a dose of 5 mg 2) Continue to follow  Thank you. Okey Regal, PharmD [phone]

## 2013-02-03 NOTE — Progress Notes (Signed)
[facility] lab called with positive MRSA culture results for Right BKA wound swab collected on 01/31/13. Dr. Malachi Bonds notified of results @ 10:40. Patient placed on Contact Isolation. No new orders given at this time.

## 2013-02-03 NOTE — Progress Notes (Addendum)
[facility] PROGRESS NOTE  Angelica Lawrence YNW:[id number] DOB: [birth year] DOA: 01/30/2013 PCP: [name], MD  79 yo F with T2DM, CAD, vascular disease s/p recent BKA of the right leg who presented from home with severe dehydration, AKI.  She does not eat and drink well.  Recovering well with IVF.  Right stump developing necrotic tissue, monitoring closely for infection.  Not on antibiotics.    Assessment/Plan  Acute on chronic renal failure likely secondary to dehydration. Creatinine near baseline -  Continue to hold diuretics  Moderate protein calorie malnutrition.  Ate well yesterday, but only had a few sips today.    -  Regular diet -  Supplements -  Start Megace  PVD s/p amputation by Dr. [name], now with wound dehiscence and necrosis at the incision site.  Developing a little erythema around borders today, somewhat pinkish, but may be due to starting silvadene.   -  Monitor very carefully for signs of true infection.   -  Appreciate orthopedic assistance -  Wound culture growing Staph and Klebsiella but will not start antibiotics unless clinically infected.  Both bacteria are sensitive to bactrim, but would include keflex for S. pneumo coverage if planning to treat  HTN, BP stable today -  Continue reduced dose atenolol -  Holding diuretics  Left leg DVT 12/10/2012 with supratherapeutic INR.  INR not trending down well with vitamin K -  Continue hold warfarin -  Given vit K 2.5 mg 11/22, 2.5 mg 11/23, 2.5mg  11/24, 5 mg 11/25 (total of 12.5mg  so far) -  No evidence of bleeding -  Repeat INR in AM -  CT head neg for hemorrhage  Suprapubic and buttock pain, resolving.   -  XR pelvis:  Negative for fracture -  Air mattress -  Foam dressing -  Frequent turning -  Continue scheduled vicodin with hold parameter  CAD, chest pain free.  Hold a/c.  Not on ASA.   -  Continue BB and statin -  Restart imdur when BP stable  T2DM, diet controlled and was hypoglycemic when not  eating -  Okay to continue dextrose fluids when pt not eating  Chronic iron deficiency anemia, hemoglobin at baseline, continue iron supplementation  Hypokalemia and hypomagnesemia resolved with IV and oral supplementations.   Diet:  regular Access:  PIV IVF:  yes Proph:  supratherapeutic warfarin  Code Status: DNR Family Communication: patient and daughter/caretaker Disposition Plan: pending drinking fluids, blood pressure stable, pain controlled, no signs of wound infection, possibly to home tomorrow   Consultants:  Orthopedics, Dr. [name]  Wound care  Procedures:  CT head  XR pelvis  Antibiotics:  None   HPI/Subjective:  Patient states her pain is improved, although she received a dose of IV dilaudid this morning for pain.  Having BMs, voids not recorded.  Emesis this AM.   Objective: Filed Vitals:   02/03/13 0504 02/03/13 1032 02/03/13 1252 02/03/13 1632  BP: 144/82 110/55 117/56 110/49  Pulse: 78 75 99 74  Temp: 97.5 F (36.4 C) 97.5 F (36.4 C) 97.5 F (36.4 C) 97.6 F (36.4 C)  TempSrc: Oral     Resp: 18 17 18 17   Height:      Weight:      SpO2: 99% 100% 100% 100%    Intake/Output Summary (Last 24 hours) at 02/03/13 1648 Last data filed at 02/03/13 1251  Gross per 24 hour  Intake 1133.75 ml  Output      0 ml  Net 1133.75 ml   Filed Weights   01/31/13 2100 02/01/13 2100 02/02/13 2039  Weight: 61.4 kg (135 lb 5.8 oz) 64.32 kg (141 lb 12.8 oz) 64.32 kg (141 lb 12.8 oz)    Exam:   General:  Thin AAF, No acute distress  HEENT:  NCAT, MMM  Cardiovascular:  RRR, nl S1, S2 no mrg, 2+ pulses, warm extremities  Respiratory:  CTAB, no increased WOB  Abdomen:   NABS, soft, NT/ND  MSK:   Normal tone and bulk, no LEE  Neuro:  Grossly intact  Psych:  Oriented to person and place  Skin:  Several stage 2 sacral decubitus ulcers, right leg with necrotic material at the lateral borders, mild pinkness forming around edges of wound and subtle sweet  odor.  No induration or fluctuance.    Data Reviewed: Basic Metabolic Panel:  Recent Labs Lab 01/30/13 1730 01/31/13 0511 02/01/13 0550 02/02/13 0506 02/03/13 0555  NA 131* 136 136 133* 131*  K 4.8 3.0* 3.8 2.9* 4.4  CL 94* 97 100 97 100  CO2 25 25 28 25 22   GLUCOSE 66* 55* 114* 164* 188*  BUN 43* 40* 35* 31* 27*  CREATININE 1.89* 1.72* 1.31* 1.18* 1.03  CALCIUM 8.2* 8.3* 8.3* 7.7* 8.2*  MG  --  1.4*  --  1.4*  --    Liver Function Tests:  Recent Labs Lab 01/30/13 1730 01/31/13 0511  AST 48* 22  ALT 18 11  ALKPHOS 61 67  BILITOT 0.7 0.8  PROT 6.3 5.9*  ALBUMIN 1.8* 1.7*   No results found for this basename: LIPASE, AMYLASE,  in the last 168 hours No results found for this basename: AMMONIA,  in the last 168 hours CBC:  Recent Labs Lab 01/30/13 1730 01/31/13 0511 02/01/13 0550 02/02/13 0506 02/03/13 0555  WBC 10.8* 8.8 6.9 7.7 7.0  HGB 10.7* 10.6* 10.8* 9.8* 11.0*  HCT 30.5* 30.7* 33.5* 28.4* 31.5*  MCV 87.4 86.2 85.5 86.6 85.8  PLT 233 234 230 184 174   Cardiac Enzymes: No results found for this basename: CKTOTAL, CKMB, CKMBINDEX, TROPONINI,  in the last 168 hours BNP (last 3 results)  Recent Labs  10/16/12 2045 12/16/12 2006  PROBNP 1528.0* 18490.0*   CBG:  Recent Labs Lab 01/31/13 1624 02/01/13 0749 02/01/13 1608 02/02/13 0745 02/03/13 0724  GLUCAP 150* 123* 172* 166* 173*    Recent Results (from the past 240 hour(s))  MRSA PCR SCREENING     Status: None   Collection Time    01/30/13 11:19 PM      Result Value Range Status   MRSA by PCR NEGATIVE  NEGATIVE Final   Comment:            The GeneXpert MRSA Assay (FDA     approved for NASAL specimens     only), is one component of a     comprehensive MRSA colonization     surveillance program. It is not     intended to diagnose MRSA     infection nor to guide or     monitor treatment for     MRSA infections.  WOUND CULTURE     Status: None   Collection Time    01/31/13 11:04 AM       Result Value Range Status   Specimen Description WOUND   Final   Special Requests Normal RIGHT BKA STUMP   Final   Gram Stain     Final   Value: NO WBC SEEN  NO SQUAMOUS EPITHELIAL CELLS SEEN     FEW GRAM NEGATIVE RODS     Performed at [facility]   Culture     Final   Value: ABUNDANT KLEBSIELLA PNEUMONIAE     ABUNDANT METHICILLIN RESISTANT STAPHYLOCOCCUS AUREUS     Note: RIFAMPIN AND GENTAMICIN SHOULD NOT BE USED AS SINGLE DRUGS FOR TREATMENT OF STAPH INFECTIONS. This organism DOES NOT demonstrate inducible Clindamycin resistance in vitro. CRITICAL RESULT CALLED TO, READ BACK BY AND VERIFIED WITH: MAKENZIE RN BY      INGRAM A 02/03/13 10AM     Performed at [facility]   Report Status 02/03/2013 FINAL   Final   Organism ID, Bacteria KLEBSIELLA PNEUMONIAE   Final   Organism ID, Bacteria METHICILLIN RESISTANT STAPHYLOCOCCUS AUREUS   Final     Studies: No results found.  Scheduled Meds: . atenolol  12.5 mg Oral Daily  . docusate sodium  100 mg Oral BID  . feeding supplement (ENSURE COMPLETE)  237 mL Oral BID BM  . ferrous sulfate  325 mg Oral Q breakfast  . HYDROcodone-acetaminophen  2 tablet Oral Q4H while awake  . multivitamin with minerals  1 tablet Oral Daily  . pantoprazole  40 mg Oral Daily  . polyethylene glycol  17 g Oral Daily  . senna  2 tablet Oral QHS  . silver sulfADIAZINE   Topical Daily  . simvastatin  40 mg Oral q1800  . traZODone  100 mg Oral QHS  . Warfarin - Pharmacist Dosing Inpatient   Does not apply q1800   Continuous Infusions: . dextrose 5 % and 0.45% NaCl 50 mL/hr at 02/03/13 1623    Principal Problem:   Acute on chronic renal failure Active Problems:   HYPERTENSION   CORONARY ARTERY DISEASE   DM (diabetes mellitus)   Osteomyelitis of ankle or foot, right, acute   DVT (deep venous thrombosis)   Long term (current) use of anticoagulants    Time spent: 30 min    Jazmina Muhlenkamp, [facility]  [facility] Pager  [phone]. If 7PM-7AM, please contact night-coverage at [email or web address], password [facility] 02/03/2013, 4:48 PM  LOS: 4 days

## 2013-02-04 LAB — BASIC METABOLIC PANEL
BUN: 25 mg/dL — ABNORMAL HIGH (ref 6–23)
Calcium: 8.6 mg/dL (ref 8.4–10.5)
Chloride: 100 mEq/L (ref 96–112)
Glucose, Bld: 181 mg/dL — ABNORMAL HIGH (ref 70–99)
Potassium: 4.9 mEq/L (ref 3.5–5.1)
Sodium: 130 mEq/L — ABNORMAL LOW (ref 135–145)

## 2013-02-04 LAB — PROTIME-INR
INR: 4.81 — ABNORMAL HIGH (ref 0.00–1.49)
Prothrombin Time: 43.1 seconds — ABNORMAL HIGH (ref 11.6–15.2)

## 2013-02-04 LAB — CBC
HCT: 30.1 % — ABNORMAL LOW (ref 36.0–46.0)
Hemoglobin: 10.6 g/dL — ABNORMAL LOW (ref 12.0–15.0)
MCH: 29.7 pg (ref 26.0–34.0)
MCHC: 35.2 g/dL (ref 30.0–36.0)
RBC: 3.57 MIL/uL — ABNORMAL LOW (ref 3.87–5.11)
RDW: 14.8 % (ref 11.5–15.5)
WBC: 8 10*3/uL (ref 4.0–10.5)

## 2013-02-04 MED ORDER — VITAMIN K1 10 MG/ML IJ SOLN
5.0000 mg | Freq: Once | INTRAMUSCULAR | Status: AC
  Administered 2013-02-04: 5 mg via SUBCUTANEOUS
  Filled 2013-02-04: qty 0.5

## 2013-02-04 NOTE — Progress Notes (Signed)
CRITICAL VALUE ALERT  Critical value received: INR=6.17 Date of notification:  02/04/13  Time of notification:  07:18  Critical value read back:yes  Nurse who received alert:  Justin Mend  MD notified (1st page):  Dr. Benjamine Mola  Time of first page:  07:45  MD notified (2nd page):  Time of second page:  Responding MD:  Dr. Benjamine Mola Time MD responded: 07:46

## 2013-02-04 NOTE — Progress Notes (Signed)
Second attempt made to obtain air bed. Will continue to monitor.

## 2013-02-04 NOTE — Progress Notes (Signed)
Per pt request, daughter contacted and updated on pt condition. Will continue to monitor.

## 2013-02-04 NOTE — Progress Notes (Signed)
[facility] PROGRESS NOTE  Angelica Lawrence WUJ:[id number] DOB: 1926-08-11 DOA: 01/30/2013 PCP: [name], MD  79 yo F with T2DM, CAD, vascular disease s/p recent BKA of the right leg who presented from home with severe dehydration, AKI.  She does not eat and drink well.  Recovering well with IVF.  Right stump developing necrotic tissue, monitoring closely for infection.  Not on antibiotics.    Assessment/Plan  Acute on chronic renal failure likely secondary to dehydration. Creatinine near baseline -  Continue to hold diuretics  Moderate protein calorie malnutrition.  Ate well yesterday, but only had a few sips today.    -  Regular diet -  Supplements -  Start Megace  PVD s/p amputation by Dr. [name], now with wound dehiscence and necrosis at the incision site.  Developing a little erythema around borders today, somewhat pinkish, but may be due to starting silvadene.   -  Monitor very carefully for signs of true infection.   -  Appreciate orthopedic assistance -  Wound culture growing Staph and Klebsiella but will not start antibiotics unless clinically infected.  Both bacteria are sensitive to bactrim, but would include keflex for S. pneumo coverage if planning to treat  HTN, BP stable today -  Continue reduced dose atenolol -  Holding diuretics  Left leg DVT 12/10/2012 with supratherapeutic INR.  INR not trending down well with vitamin K -  Continue hold warfarin -  Given vit K 2.5 mg 11/22, 2.5 mg 11/23, 2.5mg  11/24, 5 mg 11/25, 5 mg 11/25- 5 mg SQ on 11/26 -  No evidence of bleeding -  Repeat INR this PM -  CT head neg for hemorrhage  Suprapubic and buttock pain, resolving.   -  XR pelvis:  Negative for fracture -  Air mattress -  Foam dressing -  Frequent turning -  Continue scheduled vicodin with hold parameter  CAD, chest pain free.  Hold a/c.  Not on ASA.   -  Continue BB and statin -  Restart imdur when BP stable  T2DM, diet controlled and was hypoglycemic when  not eating -  Okay to continue dextrose fluids when pt not eating  Chronic iron deficiency anemia, hemoglobin at baseline, continue iron supplementation  Hypokalemia and hypomagnesemia resolved with IV and oral supplementations.   Diet:  regular Access:  PIV IVF:  yes Proph:  supratherapeutic warfarin  Code Status: DNR Family Communication: patient Disposition Plan: once INR controlled   Consultants:  Orthopedics, Dr. [name]  Wound care  Procedures:  CT head  XR pelvis  Antibiotics:  None   HPI/Subjective:  feeling well today, no SOB, no CP   Objective: Filed Vitals:   02/03/13 1252 02/03/13 1632 02/03/13 2125 02/04/13 0621  BP: 117/56 110/49 116/64 120/58  Pulse: 99 74 79 79  Temp: 97.5 F (36.4 C) 97.6 F (36.4 C) 97.3 F (36.3 C) 97.7 F (36.5 C)  TempSrc:   Oral Oral  Resp: 18 17 17 17   Height:      Weight:   64.32 kg (141 lb 12.8 oz)   SpO2: 100% 100% 100% 98%    Intake/Output Summary (Last 24 hours) at 02/04/13 1027 Last data filed at 02/04/13 0700  Gross per 24 hour  Intake 1319.17 ml  Output      0 ml  Net 1319.17 ml   Filed Weights   02/01/13 2100 02/02/13 2039 02/03/13 2125  Weight: 64.32 kg (141 lb 12.8 oz) 64.32 kg (141 lb 12.8 oz)  64.32 kg (141 lb 12.8 oz)    Exam:   General:  Thin AAF, No acute distress  HEENT:  NCAT, MMM  Cardiovascular:  RRR, nl S1, S2 no mrg, 2+ pulses, warm extremities  Respiratory:  CTAB, no increased WOB  Abdomen:   NABS, soft, NT/ND  MSK:   Normal tone and bulk, no LEE  Neuro:  Grossly intact  Psych:  Oriented to person and place  Skin:  Several stage 2 sacral decubitus ulcers, right leg with necrotic material at the lateral borders, mild pinkness forming around edges of wound and subtle sweet odor.  No induration or fluctuance.    Data Reviewed: Basic Metabolic Panel:  Recent Labs Lab 01/30/13 1730 01/31/13 0511 02/01/13 0550 02/02/13 0506 02/03/13 0555 02/04/13 0550  NA 131* 136 136  133* 131* 130*  K 4.8 3.0* 3.8 2.9* 4.4 4.9  CL 94* 97 100 97 100 100  CO2 25 25 28 25 22 19   GLUCOSE 66* 55* 114* 164* 188* 181*  BUN 43* 40* 35* 31* 27* 25*  CREATININE 1.89* 1.72* 1.31* 1.18* 1.03 1.04  CALCIUM 8.2* 8.3* 8.3* 7.7* 8.2* 8.6  MG  --  1.4*  --  1.4*  --   --    Liver Function Tests:  Recent Labs Lab 01/30/13 1730 01/31/13 0511  AST 48* 22  ALT 18 11  ALKPHOS 61 67  BILITOT 0.7 0.8  PROT 6.3 5.9*  ALBUMIN 1.8* 1.7*   No results found for this basename: LIPASE, AMYLASE,  in the last 168 hours No results found for this basename: AMMONIA,  in the last 168 hours CBC:  Recent Labs Lab 01/31/13 0511 02/01/13 0550 02/02/13 0506 02/03/13 0555 02/04/13 0550  WBC 8.8 6.9 7.7 7.0 8.0  HGB 10.6* 10.8* 9.8* 11.0* 10.6*  HCT 30.7* 33.5* 28.4* 31.5* 30.1*  MCV 86.2 85.5 86.6 85.8 84.3  PLT 234 230 184 174 213   Cardiac Enzymes: No results found for this basename: CKTOTAL, CKMB, CKMBINDEX, TROPONINI,  in the last 168 hours BNP (last 3 results)  Recent Labs  10/16/12 2045 12/16/12 2006  PROBNP 1528.0* 18490.0*   CBG:  Recent Labs Lab 02/01/13 0749 02/01/13 1608 02/02/13 0745 02/03/13 0724 02/04/13 0807  GLUCAP 123* 172* 166* 173* 169*    Recent Results (from the past 240 hour(s))  MRSA PCR SCREENING     Status: None   Collection Time    01/30/13 11:19 PM      Result Value Range Status   MRSA by PCR NEGATIVE  NEGATIVE Final   Comment:            The GeneXpert MRSA Assay (FDA     approved for NASAL specimens     only), is one component of a     comprehensive MRSA colonization     surveillance program. It is not     intended to diagnose MRSA     infection nor to guide or     monitor treatment for     MRSA infections.  WOUND CULTURE     Status: None   Collection Time    01/31/13 11:04 AM      Result Value Range Status   Specimen Description WOUND   Final   Special Requests Normal RIGHT BKA STUMP   Final   Gram Stain     Final   Value: NO  WBC SEEN     NO SQUAMOUS EPITHELIAL CELLS SEEN     FEW GRAM NEGATIVE RODS  Performed at [facility]     Final   Value: ABUNDANT KLEBSIELLA PNEUMONIAE     ABUNDANT METHICILLIN RESISTANT STAPHYLOCOCCUS AUREUS     Note: RIFAMPIN AND GENTAMICIN SHOULD NOT BE USED AS SINGLE DRUGS FOR TREATMENT OF STAPH INFECTIONS. This organism DOES NOT demonstrate inducible Clindamycin resistance in vitro. CRITICAL RESULT CALLED TO, READ BACK BY AND VERIFIED WITH: MAKENZIE RN BY      INGRAM A 02/03/13 10AM     Performed at [facility]   Report Status 02/03/2013 FINAL   Final   Organism ID, Bacteria KLEBSIELLA PNEUMONIAE   Final   Organism ID, Bacteria METHICILLIN RESISTANT STAPHYLOCOCCUS AUREUS   Final     Studies: No results found.  Scheduled Meds: . atenolol  12.5 mg Oral Daily  . docusate sodium  100 mg Oral BID  . feeding supplement (ENSURE COMPLETE)  237 mL Oral BID BM  . ferrous sulfate  325 mg Oral Q breakfast  . HYDROcodone-acetaminophen  2 tablet Oral Q4H while awake  . megestrol  400 mg Oral Daily  . multivitamin with minerals  1 tablet Oral Daily  . pantoprazole  40 mg Oral Daily  . senna  2 tablet Oral QHS  . silver sulfADIAZINE   Topical Daily  . simvastatin  40 mg Oral q1800  . traZODone  100 mg Oral QHS  . Warfarin - Pharmacist Dosing Inpatient   Does not apply q1800   Continuous Infusions: . dextrose 5 % and 0.45% NaCl 50 mL/hr at 02/04/13 8657    Principal Problem:   Acute on chronic renal failure Active Problems:   HYPERTENSION   CORONARY ARTERY DISEASE   DM (diabetes mellitus)   Osteomyelitis of ankle or foot, right, acute   DVT (deep venous thrombosis)   Long term (current) use of anticoagulants    Time spent: 35 min    Branden Vine  [facility] Pager [phone]. If 7PM-7AM, please contact night-coverage at [email or web address], password [facility] 02/04/2013, 10:27 AM  LOS: 5 days

## 2013-02-04 NOTE — Progress Notes (Signed)
Request made for air overlay mattress d/t poor pt mobility and sacral wound. Will continue to monitor.

## 2013-02-04 NOTE — Progress Notes (Signed)
Spoke with MD Benjamine Mola regarding pt leg wound, MD request that a Call be placed to MD on call for Dr. [name]. Pt with increased amount of purulent drainage and dark tissue to Right BKA wound. MD on call states is going to text Dr. [name]. Will continue to monitor.

## 2013-02-04 NOTE — Progress Notes (Signed)
PHARMACY FOLLOW UP NOTE   Pharmacy Consult for : Coumadin Indication: Recent DVT  Dosing Weight: 64 kg  Labs:  Recent Labs  02/02/13 0506 02/03/13 0555 02/03/13 1920 02/04/13 0550 02/04/13 1951  HGB 9.8* 11.0*  --  10.6*  --   HCT 28.4* 31.5*  --  30.1*  --   PLT 184 174  --  213  --   LABPROT 53.1* 54.0* 48.2* 52.2* 43.1*  INR 6.32* 6.46* 5.58* 6.17* 4.81*  CREATININE 1.18* 1.03  --  1.04  --    Lab Results  Component Value Date   INR 4.81* 02/04/2013   INR 6.17* 02/04/2013   INR 5.58* 02/03/2013    Estimated Creatinine Clearance: 39.2 ml/min (by C-G formula based on Cr of 1.04).  Pertinent Medications:  Scheduled:  . ferrous sulfate  325 mg Oral Q breakfast  . phytonadione  5 mg Oral Once  . simvastatin  40 mg Oral q1800   Anti-infectives   None      Assessment:  79 y/o female on Coumadin PTA for recent DVT. Patient has been SUPRAtherapeutic since admission and has now received 3 doses of Vitamin K 2.5 mg po.  INR trended up again earlier today to 6.17 after receiving multiple dose of Vit K and NO coumadin since admission. Vitamin K 5 mg was given subQ today and INR is currently at 4.81. H/H stable with No bleeding complications noted   No drug interactions or lab interferences identified.  Goal:  INR 2-3   Plan: 1. No coumadin today. 2. F/u INR in AM  3. Consider repeating Vitamin K if INR trends up again. 4. Daily INR's, CBC.  Monitor for bleeding complications.    Vinnie Level, PharmD.  Clinical Pharmacist Pager [phone]

## 2013-02-05 DIAGNOSIS — E46 Unspecified protein-calorie malnutrition: Secondary | ICD-10-CM

## 2013-02-05 LAB — CBC
HCT: 27.1 % — ABNORMAL LOW (ref 36.0–46.0)
Hemoglobin: 9.9 g/dL — ABNORMAL LOW (ref 12.0–15.0)
MCH: 30.7 pg (ref 26.0–34.0)
MCHC: 36.5 g/dL — ABNORMAL HIGH (ref 30.0–36.0)
Platelets: 163 10*3/uL (ref 150–400)

## 2013-02-05 LAB — BASIC METABOLIC PANEL
BUN: 23 mg/dL (ref 6–23)
Calcium: 8.3 mg/dL — ABNORMAL LOW (ref 8.4–10.5)
GFR calc Af Amer: 62 mL/min — ABNORMAL LOW (ref >90)
GFR calc non Af Amer: 53 mL/min — ABNORMAL LOW (ref >90)
Glucose, Bld: 151 mg/dL — ABNORMAL HIGH (ref 70–99)
Potassium: 4.6 mEq/L (ref 3.5–5.1)
Sodium: 130 mEq/L — ABNORMAL LOW (ref 135–145)

## 2013-02-05 LAB — PROTIME-INR: Prothrombin Time: 36.1 seconds — ABNORMAL HIGH (ref 11.6–15.2)

## 2013-02-05 LAB — GLUCOSE, CAPILLARY: Glucose-Capillary: 156 mg/dL — ABNORMAL HIGH (ref 70–99)

## 2013-02-05 MED ORDER — COLLAGENASE 250 UNIT/GM EX OINT
TOPICAL_OINTMENT | Freq: Every day | CUTANEOUS | Status: DC
  Administered 2013-02-05 – 2013-02-06 (×2): via TOPICAL
  Filled 2013-02-05 (×2): qty 30

## 2013-02-05 NOTE — Progress Notes (Signed)
[facility] PROGRESS NOTE  Angelica Lawrence RUE:[id number] DOB: 1926/08/11 DOA: 01/30/2013 PCP: [name], MD  79 yo F with T2DM, CAD, vascular disease s/p recent BKA of the right leg who presented from home with severe dehydration, AKI.  She does not eat and drink well.  Recovering well with IVF.  Right stump developing necrotic tissue, monitoring closely for infection.  Not on antibiotics.    Assessment/Plan  Acute on chronic renal failure likely secondary to dehydration. Creatinine near baseline -  Continue to hold diuretics  Moderate protein calorie malnutrition.  Ate well yesterday, but only had a few sips today.    -  Regular diet -  Supplements -  Start Megace  PVD s/p amputation by Dr. [name], now with wound dehiscence and necrosis at the incision site.  Developing a little erythema around borders today, somewhat pinkish, but may be due to starting silvadene.   -  Monitor very carefully for signs of true infection.   -  Appreciate orthopedic assistance- will see today -  Wound culture growing Staph and Klebsiella but will not start antibiotics unless clinically infected.  Both bacteria are sensitive to bactrim, but would include keflex for S. pneumo coverage if planning to treat  HTN, BP stable today -  Continue reduced dose atenolol -  Holding diuretics  Left leg DVT 12/10/2012 with supratherapeutic INR.  INR not trending down well with vitamin K -  Continue hold warfarin -  Given vit K 2.5 mg 11/22, 2.5 mg 11/23, 2.5mg  11/24, 5 mg 11/25, 5 mg 11/25- 5 mg SQ on 11/26 -  No evidence of bleeding -  Repeat INR in AM -  CT head neg for hemorrhage  Suprapubic and buttock pain, resolving.   -  XR pelvis:  Negative for fracture -  Air mattress -  Foam dressing -  Frequent turning -  Continue scheduled vicodin with hold parameter  CAD, chest pain free.  Hold a/c.  Not on ASA.   -  Continue BB and statin -  Restart imdur when BP stable  T2DM, diet controlled and was  hypoglycemic when not eating -  Okay to continue dextrose fluids when pt not eating  Chronic iron deficiency anemia, hemoglobin at baseline, continue iron supplementation  Hypokalemia and hypomagnesemia resolved with IV and oral supplementations.   Diet:  regular Access:  PIV IVF:  yes Proph:  supratherapeutic warfarin  Code Status: DNR Family Communication: patient Disposition Plan: ?d/c in AM?   Consultants:  Orthopedics, Dr. [name]  Wound care  Procedures:  CT head  XR pelvis  Antibiotics:  None   HPI/Subjective:  feeling well today, no SOB, no CP   Objective: Filed Vitals:   02/04/13 1406 02/04/13 1740 02/04/13 2207 02/05/13 0524  BP: 113/62 112/70 92/52 106/65  Pulse: 93 82 86 87  Temp: 97.6 F (36.4 C) 97.7 F (36.5 C) 97.5 F (36.4 C) 97.8 F (36.6 C)  TempSrc: Oral Oral Oral Oral  Resp: 18 18 19 20   Height:      Weight:   65.07 kg (143 lb 7.3 oz)   SpO2: 100% 100% 100% 95%    Intake/Output Summary (Last 24 hours) at 02/05/13 0814 Last data filed at 02/05/13 1478  Gross per 24 hour  Intake 1157.5 ml  Output    152 ml  Net 1005.5 ml   Filed Weights   02/02/13 2039 02/03/13 2125 02/04/13 2207  Weight: 64.32 kg (141 lb 12.8 oz) 64.32 kg (141 lb 12.8  oz) 65.07 kg (143 lb 7.3 oz)    Exam:   General:  Thin AAF, No acute distress  HEENT:  NCAT, MMM  Cardiovascular:  RRR, nl S1, S2 no mrg, 2+ pulses, warm extremities  Respiratory:  CTAB, no increased WOB  Abdomen:   NABS, soft, NT/ND  MSK:   Normal tone and bulk, no LEE  Neuro:  Grossly intact  Psych:  Oriented to person and place  Skin:  Several stage 2 sacral decubitus ulcers, right leg with necrotic material at the lateral borders, mild pinkness forming around edges of wound-    Data Reviewed: Basic Metabolic Panel:  Recent Labs Lab 01/30/13 1730 01/31/13 0511 02/01/13 0550 02/02/13 0506 02/03/13 0555 02/04/13 0550 02/05/13 0550  NA 131* 136 136 133* 131* 130* 130*  K  4.8 3.0* 3.8 2.9* 4.4 4.9 4.6  CL 94* 97 100 97 100 100 99  CO2 25 25 28 25 22 19 22   GLUCOSE 66* 55* 114* 164* 188* 181* 151*  BUN 43* 40* 35* 31* 27* 25* 23  CREATININE 1.89* 1.72* 1.31* 1.18* 1.03 1.04 0.95  CALCIUM 8.2* 8.3* 8.3* 7.7* 8.2* 8.6 8.3*  MG  --  1.4*  --  1.4*  --   --   --    Liver Function Tests:  Recent Labs Lab 01/30/13 1730 01/31/13 0511  AST 48* 22  ALT 18 11  ALKPHOS 61 67  BILITOT 0.7 0.8  PROT 6.3 5.9*  ALBUMIN 1.8* 1.7*   No results found for this basename: LIPASE, AMYLASE,  in the last 168 hours No results found for this basename: AMMONIA,  in the last 168 hours CBC:  Recent Labs Lab 01/31/13 0511 02/01/13 0550 02/02/13 0506 02/03/13 0555 02/04/13 0550  WBC 8.8 6.9 7.7 7.0 8.0  HGB 10.6* 10.8* 9.8* 11.0* 10.6*  HCT 30.7* 33.5* 28.4* 31.5* 30.1*  MCV 86.2 85.5 86.6 85.8 84.3  PLT 234 230 184 174 213   Cardiac Enzymes: No results found for this basename: CKTOTAL, CKMB, CKMBINDEX, TROPONINI,  in the last 168 hours BNP (last 3 results)  Recent Labs  10/16/12 2045 12/16/12 2006  PROBNP 1528.0* 18490.0*   CBG:  Recent Labs Lab 02/01/13 1608 02/02/13 0745 02/03/13 0724 02/04/13 0807 02/05/13 0723  GLUCAP 172* 166* 173* 169* 156*    Recent Results (from the past 240 hour(s))  MRSA PCR SCREENING     Status: None   Collection Time    01/30/13 11:19 PM      Result Value Range Status   MRSA by PCR NEGATIVE  NEGATIVE Final   Comment:            The GeneXpert MRSA Assay (FDA     approved for NASAL specimens     only), is one component of a     comprehensive MRSA colonization     surveillance program. It is not     intended to diagnose MRSA     infection nor to guide or     monitor treatment for     MRSA infections.  WOUND CULTURE     Status: None   Collection Time    01/31/13 11:04 AM      Result Value Range Status   Specimen Description WOUND   Final   Special Requests Normal RIGHT BKA STUMP   Final   Gram Stain      Final   Value: NO WBC SEEN     NO SQUAMOUS EPITHELIAL CELLS SEEN  FEW GRAM NEGATIVE RODS     Performed at [facility]   Culture     Final   Value: ABUNDANT KLEBSIELLA PNEUMONIAE     ABUNDANT METHICILLIN RESISTANT STAPHYLOCOCCUS AUREUS     Note: RIFAMPIN AND GENTAMICIN SHOULD NOT BE USED AS SINGLE DRUGS FOR TREATMENT OF STAPH INFECTIONS. This organism DOES NOT demonstrate inducible Clindamycin resistance in vitro. CRITICAL RESULT CALLED TO, READ BACK BY AND VERIFIED WITH: MAKENZIE RN BY      INGRAM A 02/03/13 10AM     Performed at [facility]   Report Status 02/03/2013 FINAL   Final   Organism ID, Bacteria KLEBSIELLA PNEUMONIAE   Final   Organism ID, Bacteria METHICILLIN RESISTANT STAPHYLOCOCCUS AUREUS   Final     Studies: No results found.  Scheduled Meds: . atenolol  12.5 mg Oral Daily  . docusate sodium  100 mg Oral BID  . feeding supplement (ENSURE COMPLETE)  237 mL Oral BID BM  . ferrous sulfate  325 mg Oral Q breakfast  . HYDROcodone-acetaminophen  2 tablet Oral Q4H while awake  . megestrol  400 mg Oral Daily  . multivitamin with minerals  1 tablet Oral Daily  . pantoprazole  40 mg Oral Daily  . senna  2 tablet Oral QHS  . silver sulfADIAZINE   Topical Daily  . simvastatin  40 mg Oral q1800  . traZODone  100 mg Oral QHS  . Warfarin - Pharmacist Dosing Inpatient   Does not apply q1800   Continuous Infusions: . dextrose 5 % and 0.45% NaCl 50 mL/hr at 02/04/13 2242    Principal Problem:   Acute on chronic renal failure Active Problems:   HYPERTENSION   CORONARY ARTERY DISEASE   DM (diabetes mellitus)   Osteomyelitis of ankle or foot, right, acute   DVT (deep venous thrombosis)   Long term (current) use of anticoagulants    Time spent: 35 min    Abra Lingenfelter  [facility] Pager [phone]. If 7PM-7AM, please contact night-coverage at [email or web address], password [facility] 02/05/2013, 8:14 AM  LOS: 6 days

## 2013-02-05 NOTE — Progress Notes (Signed)
Patient ID: Angelica Lawrence, female   DOB: 1926-12-13, 79 y.o.   MRN: [id number] Right transtibial amputation wound with some ischemic changes as well as some healthy granulation tissue. We will change the patient 2 Santyl dressing changes. I will followup in the office.

## 2013-02-05 NOTE — Progress Notes (Signed)
Pt placed on air bed. Will continue to monitor.

## 2013-02-05 NOTE — Progress Notes (Addendum)
PHARMACY CONSULT NOTE   Pharmacy Consult for :  Coumadin Indication: Recent DVT  Dosing weight 65 kg  Labs:  Recent Labs  02/03/13 0555  02/04/13 0550 02/04/13 1951 02/05/13 0550  HGB 11.0*  --  10.6*  --   --   HCT 31.5*  --  30.1*  --   --   PLT 174  --  213  --   --   INR 6.46*  < > 6.17* 4.81* 3.81*  CREATININE 1.03  --  1.04  --  0.95   Lab Results  Component Value Date   INR 3.81* 02/05/2013   INR 4.81* 02/04/2013   INR 6.17* 02/04/2013   Estimated Creatinine Clearance: 42.9 ml/min (by C-G formula based on Cr of 0.95).  Medications:  Scheduled:  . atenolol  12.5 mg Oral Daily  . docusate sodium  100 mg Oral BID  . feeding supplement (ENSURE COMPLETE)  237 mL Oral BID BM  . ferrous sulfate  325 mg Oral Q breakfast  . HYDROcodone-acetaminophen  2 tablet Oral Q4H while awake  . megestrol  400 mg Oral Daily  . multivitamin with minerals  1 tablet Oral Daily  . pantoprazole  40 mg Oral Daily  . senna  2 tablet Oral QHS  . silver sulfADIAZINE   Topical Daily  . simvastatin  40 mg Oral q1800  . traZODone  100 mg Oral QHS  . Warfarin - Pharmacist Dosing Inpatient   Does not apply q1800    Assessment:  79 y/o female on Coumadin PTA for recent DVT. Patient has been SUPRAtherapeutic since admission despite repeated doses of Vit K.  Patient finally responding to Vit K when given sq dose yesterday.  INR continues to trend down.  No bleeding complications noted   Goal of Therapy:   INR 2-3   Plan:  1. Hold Coumadin today. 2.   Follow up AM INR  Thank you. Okey Regal, PharmD 02/05/2013, 7:54 AM

## 2013-02-06 DIAGNOSIS — D509 Iron deficiency anemia, unspecified: Secondary | ICD-10-CM

## 2013-02-06 LAB — BASIC METABOLIC PANEL
BUN: 22 mg/dL (ref 6–23)
Calcium: 8.5 mg/dL (ref 8.4–10.5)
Creatinine, Ser: 1 mg/dL (ref 0.50–1.10)
GFR calc Af Amer: 58 mL/min — ABNORMAL LOW (ref >90)
GFR calc non Af Amer: 50 mL/min — ABNORMAL LOW (ref >90)
Glucose, Bld: 154 mg/dL — ABNORMAL HIGH (ref 70–99)
Potassium: 4.5 mEq/L (ref 3.5–5.1)

## 2013-02-06 LAB — CBC
Hemoglobin: 10.2 g/dL — ABNORMAL LOW (ref 12.0–15.0)
MCH: 30.1 pg (ref 26.0–34.0)
MCHC: 36.2 g/dL — ABNORMAL HIGH (ref 30.0–36.0)
Platelets: 207 10*3/uL (ref 150–400)
RDW: 15 % (ref 11.5–15.5)

## 2013-02-06 LAB — GLUCOSE, CAPILLARY: Glucose-Capillary: 133 mg/dL — ABNORMAL HIGH (ref 70–99)

## 2013-02-06 LAB — PROTIME-INR: Prothrombin Time: 32.6 seconds — ABNORMAL HIGH (ref 11.6–15.2)

## 2013-02-06 MED ORDER — MEGESTROL ACETATE 400 MG/10ML PO SUSP: 400.0000 mg | Freq: Every day | ORAL | Status: DC

## 2013-02-06 MED ORDER — COLLAGENASE 250 UNIT/GM EX OINT: TOPICAL_OINTMENT | Freq: Every day | CUTANEOUS | Status: DC

## 2013-02-06 MED ORDER — ATENOLOL 12.5 MG HALF TABLET: 12.5000 mg | ORAL_TABLET | Freq: Every day | ORAL | Status: DC

## 2013-02-06 MED ORDER — BISACODYL 10 MG RE SUPP: 10.0000 mg | Freq: Every day | RECTAL | Status: AC | PRN

## 2013-02-06 MED ORDER — POLYETHYLENE GLYCOL 3350 17 G PO PACK: 17.0000 g | PACK | Freq: Every day | ORAL | Status: AC | PRN

## 2013-02-06 NOTE — Progress Notes (Signed)
02/06/13 1519 nsg Patient to discharge to home with home health. Patient will be transported with ambulance. Daughter in room and d/c instructions given and understood. Social work notified for transport. Patient has stage II on her bottom and we are placing mepilex and epc cream daughter is aware. Dressing change done before discharge.

## 2013-02-06 NOTE — Clinical Social Work Note (Signed)
Patient medically stable for discharge home and daughter requested ambulance transport. CSW facilitated patient's transport home via ambulance.  Genelle Bal, MSW, LCSW [phone]

## 2013-02-06 NOTE — Progress Notes (Signed)
ANTICOAGULATION CONSULT NOTE - Follow Up Consult  Pharmacy Consult for Coumadin Indication: DVT  No Known Allergies  Patient Measurements: Height: 5\' 7"  (170.2 cm) Weight: 143 lb 7.3 oz (65.07 kg) IBW/kg (Calculated) : 61.6 Heparin Dosing Weight:    Vital Signs: Temp: 97.7 F (36.5 C) (11/28 1013) Temp src: Oral (11/28 1013) BP: 105/52 mmHg (11/28 0900) Pulse Rate: 97 (11/28 0900)  Labs:  Recent Labs  02/04/13 0550 02/04/13 1951 02/05/13 0550 02/06/13 0850  HGB 10.6*  --  9.9* 10.2*  HCT 30.1*  --  27.1* 28.2*  PLT 213  --  163 207  LABPROT 52.2* 43.1* 36.1* 32.6*  INR 6.17* 4.81* 3.81* 3.33*  CREATININE 1.04  --  0.95 1.00    Estimated Creatinine Clearance: 40.7 ml/min (by C-G formula based on Cr of 1).   79 y.o. Female admitted w/ confusion, pain, decreased UOP.   PMH: anemia, CAD, diverticulosis, HLD, HTN, PVD, vit B12 def, DJD, insomnia, DVT, DM, CVA, r leg amputation.  AC: Warfarin for hx DVT in 10/14 - INR remain supratherapeutic [after 4 days of Vit K 22.5 mg, Seems absorption problem w/Vit K as finally responded to sq dose. Admit INR 6.17 now 3.33. If adsorption of Vit K from diet is impaired, she will likely need miniscule doses of Coumadin.  ID: Afebrile. WBC 10.2 up-s/p R BKA with wound dehiscence. Mild ischemic changes, no surgery at this time. No ABX. Afebrile. Wound culture growing Staph and Klebsiella but will not start antibiotics unless clinically infected.  CV: hx of HTN; 105/52, HR 97 on atenolol, Zocor  Endo/GI: Type 2 DM; diet control; CBG 133  RENAL: AoCKD (Baseline SCr 0.8-1). SCr 1.2>1 CCL 40, K 4.5 Holding diuretics  Heme/Onc: See AC. Chronic iron deficiency anemia; restarted iron supplement  Best practices: PPI  Goal of Therapy:  INR 2-3 Monitor platelets by anticoagulation protocol: Yes   Plan:  Continue to hold Coumadin today. Discussed plans with Dr. Benjamine Mola who may send pt home today on no Coumadin and recheck INR on  Monday.  Merilynn Finland, [employer] 02/06/2013,11:20 AM

## 2013-02-06 NOTE — Progress Notes (Signed)
   CARE MANAGEMENT NOTE 02/06/2013  Patient:  Angelica, Lawrence   Account Number:  [id number]  Date Initiated:  02/01/2013  Documentation initiated by:  [facility]  Subjective/Objective Assessment:     Action/Plan:   active with [facility]   Anticipated DC Date:  02/06/2013   Anticipated DC Plan:  HOME W HOME HEALTH SERVICES      DC Planning Services  CM consult      [facility] Choice  HOME HEALTH   Choice offered to / List presented to:             [facility] agency  [facility]   Status of service:  Completed, signed off Medicare Important Message given?   (If response is "NO", the following Medicare IM given date fields will be blank) Date Medicare IM given:   Date Additional Medicare IM given:    Discharge Disposition:  HOME W HOME HEALTH SERVICES  Per UR Regulation:    If discussed at Long Length of Stay Meetings, dates discussed:    Comments:  Contact: daughter Victorino Dike  [phone]  02/06/2013  [address] RN, [place] [phone] CM referral: HH RN, PT, OT  Call to daughter Victorino Dike regarding home health services, patient was active with [facility] prior to admission and wants to continue with agency.  [facility] [phone] called with referral, for discharge today.  02/03/2013   [address] RN, [place]  [phone] spoke with daughter Victorino Dike regarding discharge planning. She and the family take care of her at home since discharge from SNF on 01/09/2013. They have hospital bed, hoyer lift and transfer her to/from bed to chair. They provide all medical treatments. Her plan is to discharge to home when stable. NCM to continue to follow for discharge plans.

## 2013-02-06 NOTE — Discharge Summary (Signed)
Physician Discharge Summary  Angelica Lawrence ZOX:[id number] DOB: Aug 03, 1926 DOA: 01/30/2013  PCP: [name], MD  Admit date: 01/30/2013 Discharge date: 02/06/2013  Time spent: 35 minutes  Recommendations for Outpatient Follow-up:  1. Home health- PT/INR on Monday 2. Hold coumadin until then 3. Cbc, BMp 1 week  Discharge Diagnoses:  Principal Problem:   Acute on chronic renal failure Active Problems:   HYPERTENSION   CORONARY ARTERY DISEASE   DM (diabetes mellitus)   Osteomyelitis of ankle or foot, right, acute   DVT (deep venous thrombosis)   Long term (current) use of anticoagulants   Discharge Condition: improved  Diet recommendation: regular  Filed Weights   02/02/13 2039 02/03/13 2125 02/04/13 2207  Weight: 64.32 kg (141 lb 12.8 oz) 64.32 kg (141 lb 12.8 oz) 65.07 kg (143 lb 7.3 oz)    History of present illness:  Angelica Lawrence is a 79 y.o. female with Past medical history of diabetes mellitus, hypertension, coronary artery disease, recent DVT, recent amputation for osteomyelitis of the right leg, recent treatment for cellulitis of the right leg with Keflex one week ago, diabetes mellitus.  The patient is coming from home, recently discharged from the nursing home.  The patient is presenting with her daughter with initial complaint of back pain but later on the daughter mentions that she has been noticing confusion and hallucination from the patient also she hasn't had urinated since last 2 days. She mentions that her mother generally is incontinent and wears a diaper. The daughter denies any nausea vomiting diarrhea chest pain fever chills coughing or trauma.  The patient mentions that she is compliant with all her medication.  She was recently seen in the ED 10 days ago and was started on Keflex for cellulitis of the right leg and she has completed the course and the wound is healing well.  The patient has poor oral appetite   Hospital Course:  Acute on chronic renal  failure likely secondary to dehydration. Creatinine near baseline  - hold diuretic until assessed by PCP- poor oral intake  Moderate protein calorie malnutrition. Ate well yesterday, but only had a few sips today.  - Regular diet  - Supplements  - Start Megace   PVD s/p amputation by Dr. [name], now with wound dehiscence and necrosis at the incision site.  -follow up with Dr. [name]  HTN, BP stable today  - Continue reduced dose atenolol  - Holding diuretics   Left leg DVT 12/10/2012 with supratherapeutic INR. INR not trending down well with vitamin K  - Continue hold warfarin  - Given vit K 2.5 mg 11/22, 2.5 mg 11/23, 2.5mg  11/24, 5 mg 11/25, 5 mg 11/25- 5 mg SQ on 11/26  - No evidence of bleeding  - CT head neg for hemorrhage  -hold coumadin until Monday- PT/INR then  Suprapubic and buttock pain, resolving.  - XR pelvis: Negative for fracture  - Air mattress  - Foam dressing  - Frequent turning  -family wishes to take home   T2DM, diet controlled and was hypoglycemic when not eating   Chronic iron deficiency anemia, hemoglobin at baseline, continue iron supplementation   Hypokalemia and hypomagnesemia resolved with IV and oral supplementations.    Procedures: CT head  XR pelvis   Consultations:  orth  Wound care  Discharge Exam: Filed Vitals:   02/06/13 1013  BP:   Pulse:   Temp: 97.7 F (36.5 C)  Resp:     General: A+Ox3, NAD Cardiovascular: rrr  Respiratory: clear  Discharge Instructions      Discharge Orders   Future Orders Complete By Expires   Diet general  As directed    Discharge instructions  As directed    Comments:     PT/INR on Monday- DO NOT TAKE COUMADIN until then Resume home health CBC, BMP 1 week 24 hour care by family   Increase activity slowly  As directed        Medication List    STOP taking these medications       bisacodyl 5 MG EC tablet  Commonly known as:  DULCOLAX  Replaced by:  bisacodyl 10 MG suppository      furosemide 40 MG tablet  Commonly known as:  LASIX     isosorbide mononitrate 10 MG tablet  Commonly known as:  ISMO,MONOKET     traMADol 50 MG tablet  Commonly known as:  ULTRAM     warfarin 2.5 MG tablet  Commonly known as:  COUMADIN      TAKE these medications       atenolol 12.5 mg Tabs tablet  Commonly known as:  TENORMIN  Take 0.5 tablets (12.5 mg total) by mouth daily.     bisacodyl 10 MG suppository  Commonly known as:  DULCOLAX  Place 1 suppository (10 mg total) rectally daily as needed for mild constipation or moderate constipation.     collagenase ointment  Commonly known as:  SANTYL  Apply topically daily.     [employer] IRON 325 (65 FE) MG tablet  Generic drug:  ferrous sulfate  Take 325 mg by mouth daily.     HYDROcodone-acetaminophen 5-325 MG per tablet  Commonly known as:  NORCO/VICODIN  Take 1 tablet by mouth every 4 (four) hours as needed for pain.     megestrol 400 MG/10ML suspension  Commonly known as:  MEGACE  Take 10 mLs (400 mg total) by mouth daily.     multivitamin with minerals Tabs tablet  Take 1 tablet by mouth daily.     omeprazole 20 MG capsule  Commonly known as:  PRILOSEC  Take 1 capsule (20 mg total) by mouth daily.     polyethylene glycol packet  Commonly known as:  MIRALAX / GLYCOLAX  Take 17 g by mouth daily as needed for mild constipation.     simvastatin 40 MG tablet  Commonly known as:  ZOCOR  Take 1 tablet (40 mg total) by mouth at bedtime.     traZODone 100 MG tablet  Commonly known as:  DESYREL  Take 1 tablet (100 mg total) by mouth at bedtime.       No Known Allergies Follow-up Information   Follow up with DUDA,MARCUS V, MD In 2 weeks.   Specialty:  Orthopedic Surgery   Contact information:   [address] [place] [address] [phone]       Follow up with DUDA,MARCUS V, MD In 2 weeks.   Specialty:  Orthopedic Surgery   Contact information:   [address] [place]  [address] [phone]       Follow up with [name], MD In 1 week. (INR on Monday)    Specialty:  Endocrinology   Contact information:   [address] [place] [address] [phone]        The results of significant diagnostics from this hospitalization (including imaging, microbiology, ancillary and laboratory) are listed below for reference.    Significant Diagnostic Studies: Ct Head Wo Contrast  01/31/2013   CLINICAL DATA:  Altered mental status and elevated INR level.  EXAM: CT HEAD WITHOUT CONTRAST  TECHNIQUE: Contiguous axial images were obtained from the base of the skull through the vertex without intravenous contrast.  COMPARISON:  None.  FINDINGS: The brain demonstrates mild periventricular small vessel ischemic changes and diffuse cortical atrophy. The brain demonstrates no evidence of hemorrhage, acute infarction, edema, mass effect, extra-axial fluid collection, hydrocephalus or mass lesion. The skull is unremarkable.  IMPRESSION: No acute findings.   Electronically Signed   By: Irish Lack M.D.   On: 01/31/2013 08:44   Dg Pelvis Portable  02/01/2013   CLINICAL DATA:  No known injury.  Right pelvic pain.  EXAM: PORTABLE PELVIS 1-2 VIEWS  COMPARISON:  CT 09/20/2011  FINDINGS: Mild degenerative changes in the hips bilaterally. No fracture, subluxation or dislocation. Vascular calcifications noted.  IMPRESSION: No acute bony abnormality.   Electronically Signed   By: Charlett Nose M.D.   On: 02/01/2013 12:39   Dg Chest Port 1 View  01/30/2013   CLINICAL DATA:  Respiratory distress  EXAM: PORTABLE CHEST - 1 VIEW  COMPARISON:  December 16, 2012  FINDINGS: The heart size and mediastinal contours are stable. Patient is status post prior CABG and median sternotomy. There is a small right pleural effusion. There is no focal pneumonia or pulmonary edema. The visualized skeletal structures are stable.  IMPRESSION: Small right pleural effusion. No focal pneumonia or  pulmonary edema.   Electronically Signed   By: Sherian Rein M.D.   On: 01/30/2013 19:07    Microbiology: Recent Results (from the past 240 hour(s))  MRSA PCR SCREENING     Status: None   Collection Time    01/30/13 11:19 PM      Result Value Range Status   MRSA by PCR NEGATIVE  NEGATIVE Final   Comment:            The GeneXpert MRSA Assay (FDA     approved for NASAL specimens     only), is one component of a     comprehensive MRSA colonization     surveillance program. It is not     intended to diagnose MRSA     infection nor to guide or     monitor treatment for     MRSA infections.  WOUND CULTURE     Status: None   Collection Time    01/31/13 11:04 AM      Result Value Range Status   Specimen Description WOUND   Final   Special Requests Normal RIGHT BKA STUMP   Final   Gram Stain     Final   Value: NO WBC SEEN     NO SQUAMOUS EPITHELIAL CELLS SEEN     FEW GRAM NEGATIVE RODS     Performed at [facility]   Culture     Final   Value: ABUNDANT KLEBSIELLA PNEUMONIAE     ABUNDANT METHICILLIN RESISTANT STAPHYLOCOCCUS AUREUS     Note: RIFAMPIN AND GENTAMICIN SHOULD NOT BE USED AS SINGLE DRUGS FOR TREATMENT OF STAPH INFECTIONS. This organism DOES NOT demonstrate inducible Clindamycin resistance in vitro. CRITICAL RESULT CALLED TO, READ BACK BY AND VERIFIED WITH: MAKENZIE RN BY      INGRAM A 02/03/13 10AM     Performed at [facility]   Report Status 02/03/2013 FINAL   Final   Organism ID, Bacteria KLEBSIELLA PNEUMONIAE   Final   Organism ID, Bacteria METHICILLIN RESISTANT STAPHYLOCOCCUS AUREUS  Final     Labs: Basic Metabolic Panel:  Recent Labs Lab 01/30/13 1730 01/31/13 0511  02/02/13 0506 02/03/13 0555 02/04/13 0550 02/05/13 0550 02/06/13 0850  NA 131* 136  < > 133* 131* 130* 130* 129*  K 4.8 3.0*  < > 2.9* 4.4 4.9 4.6 4.5  CL 94* 97  < > 97 100 100 99 98  CO2 25 25  < > 25 22 19 22 23   GLUCOSE 66* 55*  < > 164* 188* 181* 151* 154*  BUN 43*  40*  < > 31* 27* 25* 23 22  CREATININE 1.89* 1.72*  < > 1.18* 1.03 1.04 0.95 1.00  CALCIUM 8.2* 8.3*  < > 7.7* 8.2* 8.6 8.3* 8.5  MG  --  1.4*  --  1.4*  --   --   --   --   < > = values in this interval not displayed. Liver Function Tests:  Recent Labs Lab 01/30/13 1730 01/31/13 0511  AST 48* 22  ALT 18 11  ALKPHOS 61 67  BILITOT 0.7 0.8  PROT 6.3 5.9*  ALBUMIN 1.8* 1.7*   No results found for this basename: LIPASE, AMYLASE,  in the last 168 hours No results found for this basename: AMMONIA,  in the last 168 hours CBC:  Recent Labs Lab 02/02/13 0506 02/03/13 0555 02/04/13 0550 02/05/13 0550 02/06/13 0850  WBC 7.7 7.0 8.0 6.8 10.2  HGB 9.8* 11.0* 10.6* 9.9* 10.2*  HCT 28.4* 31.5* 30.1* 27.1* 28.2*  MCV 86.6 85.8 84.3 84.2 83.2  PLT 184 174 213 163 207   Cardiac Enzymes: No results found for this basename: CKTOTAL, CKMB, CKMBINDEX, TROPONINI,  in the last 168 hours BNP: BNP (last 3 results)  Recent Labs  10/16/12 2045 12/16/12 2006  PROBNP 1528.0* 18490.0*   CBG:  Recent Labs Lab 02/02/13 0745 02/03/13 0724 02/04/13 0807 02/05/13 0723 02/06/13 0738  GLUCAP 166* 173* 169* 156* 133*       Signed:  Quanisha Drewry  [facility] 02/06/2013, 11:36 AM

## 2013-02-07 NOTE — ED Provider Notes (Signed)
I saw and evaluated the patient, reviewed the resident's note and I agree with the findings and plan.   .Face to face Exam:  General:  Awake HEENT:  Atraumatic Resp:  Normal effort Abd:  Nondistended Neuro:No focal weakness  Dinara Lupu L Lynnae Ludemann, MD 02/07/13 0714 

## 2013-02-15 ENCOUNTER — Emergency Department (HOSPITAL_COMMUNITY): Payer: Medicare Other

## 2013-02-15 ENCOUNTER — Encounter (HOSPITAL_COMMUNITY): Payer: Self-pay | Admitting: Emergency Medicine

## 2013-02-15 ENCOUNTER — Inpatient Hospital Stay (HOSPITAL_COMMUNITY): Payer: Medicare Other

## 2013-02-15 ENCOUNTER — Inpatient Hospital Stay (HOSPITAL_COMMUNITY)
Admission: EM | Admit: 2013-02-15 | Discharge: 2013-02-19 | DRG: 564 | Disposition: A | Discharge status: Hospice | Payer: Medicare Other | Attending: Internal Medicine | Admitting: Internal Medicine

## 2013-02-15 ENCOUNTER — Encounter (HOSPITAL_COMMUNITY)
Admission: EM | Disposition: A | Discharge status: Hospice | Payer: Self-pay | Source: Home / Self Care | Attending: Pulmonary Disease

## 2013-02-15 ENCOUNTER — Other Ambulatory Visit (HOSPITAL_COMMUNITY): Payer: Medicare Other

## 2013-02-15 DIAGNOSIS — I82409 Acute embolism and thrombosis of unspecified deep veins of unspecified lower extremity: Secondary | ICD-10-CM | POA: Diagnosis present

## 2013-02-15 DIAGNOSIS — E46 Unspecified protein-calorie malnutrition: Secondary | ICD-10-CM | POA: Diagnosis present

## 2013-02-15 DIAGNOSIS — G9340 Encephalopathy, unspecified: Secondary | ICD-10-CM | POA: Diagnosis not present

## 2013-02-15 DIAGNOSIS — L8990 Pressure ulcer of unspecified site, unspecified stage: Secondary | ICD-10-CM | POA: Diagnosis not present

## 2013-02-15 DIAGNOSIS — Z66 Do not resuscitate: Secondary | ICD-10-CM | POA: Diagnosis present

## 2013-02-15 DIAGNOSIS — R630 Anorexia: Secondary | ICD-10-CM | POA: Diagnosis present

## 2013-02-15 DIAGNOSIS — I739 Peripheral vascular disease, unspecified: Secondary | ICD-10-CM | POA: Diagnosis present

## 2013-02-15 DIAGNOSIS — R531 Weakness: Secondary | ICD-10-CM

## 2013-02-15 DIAGNOSIS — N183 Chronic kidney disease, stage 3 unspecified: Secondary | ICD-10-CM | POA: Diagnosis present

## 2013-02-15 DIAGNOSIS — Z947 Corneal transplant status: Secondary | ICD-10-CM

## 2013-02-15 DIAGNOSIS — K769 Liver disease, unspecified: Secondary | ICD-10-CM

## 2013-02-15 DIAGNOSIS — K5730 Diverticulosis of large intestine without perforation or abscess without bleeding: Secondary | ICD-10-CM

## 2013-02-15 DIAGNOSIS — D62 Acute posthemorrhagic anemia: Secondary | ICD-10-CM | POA: Diagnosis present

## 2013-02-15 DIAGNOSIS — E1169 Type 2 diabetes mellitus with other specified complication: Secondary | ICD-10-CM | POA: Diagnosis present

## 2013-02-15 DIAGNOSIS — N179 Acute kidney failure, unspecified: Secondary | ICD-10-CM | POA: Diagnosis present

## 2013-02-15 DIAGNOSIS — E872 Acidosis, unspecified: Secondary | ICD-10-CM | POA: Diagnosis present

## 2013-02-15 DIAGNOSIS — R609 Edema, unspecified: Secondary | ICD-10-CM

## 2013-02-15 DIAGNOSIS — E119 Type 2 diabetes mellitus without complications: Secondary | ICD-10-CM | POA: Diagnosis present

## 2013-02-15 DIAGNOSIS — Z7901 Long term (current) use of anticoagulants: Secondary | ICD-10-CM

## 2013-02-15 DIAGNOSIS — Z515 Encounter for palliative care: Secondary | ICD-10-CM

## 2013-02-15 DIAGNOSIS — I10 Essential (primary) hypertension: Secondary | ICD-10-CM | POA: Diagnosis present

## 2013-02-15 DIAGNOSIS — I2510 Atherosclerotic heart disease of native coronary artery without angina pectoris: Secondary | ICD-10-CM | POA: Diagnosis present

## 2013-02-15 DIAGNOSIS — E049 Nontoxic goiter, unspecified: Secondary | ICD-10-CM

## 2013-02-15 DIAGNOSIS — G609 Hereditary and idiopathic neuropathy, unspecified: Secondary | ICD-10-CM | POA: Diagnosis present

## 2013-02-15 DIAGNOSIS — J9819 Other pulmonary collapse: Secondary | ICD-10-CM | POA: Diagnosis not present

## 2013-02-15 DIAGNOSIS — H269 Unspecified cataract: Secondary | ICD-10-CM

## 2013-02-15 DIAGNOSIS — K922 Gastrointestinal hemorrhage, unspecified: Secondary | ICD-10-CM | POA: Diagnosis present

## 2013-02-15 DIAGNOSIS — K5641 Fecal impaction: Secondary | ICD-10-CM | POA: Diagnosis present

## 2013-02-15 DIAGNOSIS — E1029 Type 1 diabetes mellitus with other diabetic kidney complication: Secondary | ICD-10-CM

## 2013-02-15 DIAGNOSIS — Z8673 Personal history of transient ischemic attack (TIA), and cerebral infarction without residual deficits: Secondary | ICD-10-CM

## 2013-02-15 DIAGNOSIS — J309 Allergic rhinitis, unspecified: Secondary | ICD-10-CM

## 2013-02-15 DIAGNOSIS — Z951 Presence of aortocoronary bypass graft: Secondary | ICD-10-CM

## 2013-02-15 DIAGNOSIS — R739 Hyperglycemia, unspecified: Secondary | ICD-10-CM

## 2013-02-15 DIAGNOSIS — A419 Sepsis, unspecified organism: Secondary | ICD-10-CM | POA: Diagnosis present

## 2013-02-15 DIAGNOSIS — E871 Hypo-osmolality and hyponatremia: Secondary | ICD-10-CM

## 2013-02-15 DIAGNOSIS — E785 Hyperlipidemia, unspecified: Secondary | ICD-10-CM | POA: Diagnosis present

## 2013-02-15 DIAGNOSIS — E538 Deficiency of other specified B group vitamins: Secondary | ICD-10-CM | POA: Diagnosis present

## 2013-02-15 DIAGNOSIS — K5732 Diverticulitis of large intestine without perforation or abscess without bleeding: Secondary | ICD-10-CM

## 2013-02-15 DIAGNOSIS — D649 Anemia, unspecified: Secondary | ICD-10-CM

## 2013-02-15 DIAGNOSIS — E43 Unspecified severe protein-calorie malnutrition: Secondary | ICD-10-CM

## 2013-02-15 DIAGNOSIS — E860 Dehydration: Secondary | ICD-10-CM

## 2013-02-15 DIAGNOSIS — K449 Diaphragmatic hernia without obstruction or gangrene: Secondary | ICD-10-CM | POA: Diagnosis present

## 2013-02-15 DIAGNOSIS — E876 Hypokalemia: Secondary | ICD-10-CM | POA: Diagnosis present

## 2013-02-15 DIAGNOSIS — K2960 Other gastritis without bleeding: Secondary | ICD-10-CM

## 2013-02-15 DIAGNOSIS — R579 Shock, unspecified: Secondary | ICD-10-CM

## 2013-02-15 DIAGNOSIS — Z86010 Personal history of colonic polyps: Secondary | ICD-10-CM

## 2013-02-15 DIAGNOSIS — D509 Iron deficiency anemia, unspecified: Secondary | ICD-10-CM

## 2013-02-15 DIAGNOSIS — T45515A Adverse effect of anticoagulants, initial encounter: Secondary | ICD-10-CM | POA: Diagnosis present

## 2013-02-15 DIAGNOSIS — T8740 Infection of amputation stump, unspecified extremity: Principal | ICD-10-CM | POA: Diagnosis present

## 2013-02-15 DIAGNOSIS — K649 Unspecified hemorrhoids: Secondary | ICD-10-CM

## 2013-02-15 DIAGNOSIS — R2681 Unsteadiness on feet: Secondary | ICD-10-CM

## 2013-02-15 DIAGNOSIS — G4700 Insomnia, unspecified: Secondary | ICD-10-CM

## 2013-02-15 DIAGNOSIS — M1990 Unspecified osteoarthritis, unspecified site: Secondary | ICD-10-CM

## 2013-02-15 DIAGNOSIS — N189 Chronic kidney disease, unspecified: Secondary | ICD-10-CM

## 2013-02-15 DIAGNOSIS — M86171 Other acute osteomyelitis, right ankle and foot: Secondary | ICD-10-CM

## 2013-02-15 DIAGNOSIS — S88119A Complete traumatic amputation at level between knee and ankle, unspecified lower leg, initial encounter: Secondary | ICD-10-CM

## 2013-02-15 DIAGNOSIS — Y835 Amputation of limb(s) as the cause of abnormal reaction of the patient, or of later complication, without mention of misadventure at the time of the procedure: Secondary | ICD-10-CM | POA: Diagnosis present

## 2013-02-15 DIAGNOSIS — R791 Abnormal coagulation profile: Secondary | ICD-10-CM | POA: Diagnosis present

## 2013-02-15 DIAGNOSIS — D638 Anemia in other chronic diseases classified elsewhere: Secondary | ICD-10-CM | POA: Diagnosis present

## 2013-02-15 DIAGNOSIS — N390 Urinary tract infection, site not specified: Secondary | ICD-10-CM | POA: Diagnosis present

## 2013-02-15 DIAGNOSIS — K2970 Gastritis, unspecified, without bleeding: Secondary | ICD-10-CM

## 2013-02-15 DIAGNOSIS — R195 Other fecal abnormalities: Secondary | ICD-10-CM

## 2013-02-15 HISTORY — PX: ESOPHAGOGASTRODUODENOSCOPY: SHX5428

## 2013-02-15 LAB — URINALYSIS, ROUTINE W REFLEX MICROSCOPIC
Glucose, UA: NEGATIVE mg/dL
Ketones, ur: NEGATIVE mg/dL
Protein, ur: NEGATIVE mg/dL
Specific Gravity, Urine: 1.022 (ref 1.005–1.030)
pH: 5 (ref 5.0–8.0)

## 2013-02-15 LAB — CBC
HCT: 24.9 % — ABNORMAL LOW (ref 36.0–46.0)
Hemoglobin: 9 g/dL — ABNORMAL LOW (ref 12.0–15.0)
MCH: 29.7 pg (ref 26.0–34.0)
MCH: 30.2 pg (ref 26.0–34.0)
MCHC: 36.4 g/dL — ABNORMAL HIGH (ref 30.0–36.0)
MCHC: 35.4 g/dL (ref 30.0–36.0)
MCV: 83.9 fL (ref 78.0–100.0)
Platelets: 257 10*3/uL (ref 150–400)
Platelets: 261 10*3/uL (ref 150–400)
Platelets: 317 10*3/uL (ref 150–400)
RBC: 3.03 MIL/uL — ABNORMAL LOW (ref 3.87–5.11)
RDW: 18.1 % — ABNORMAL HIGH (ref 11.5–15.5)
RDW: 16.9 % — ABNORMAL HIGH (ref 11.5–15.5)
WBC: 10.8 10*3/uL — ABNORMAL HIGH (ref 4.0–10.5)
WBC: 11.4 10*3/uL — ABNORMAL HIGH (ref 4.0–10.5)

## 2013-02-15 LAB — COMPREHENSIVE METABOLIC PANEL
ALT: 21 U/L (ref 0–35)
AST: 46 U/L — ABNORMAL HIGH (ref 0–37)
Alkaline Phosphatase: 125 U/L — ABNORMAL HIGH (ref 39–117)
CO2: 17 mEq/L — ABNORMAL LOW (ref 19–32)
Chloride: 103 mEq/L (ref 96–112)
GFR calc Af Amer: 27 mL/min — ABNORMAL LOW (ref >90)
GFR calc non Af Amer: 24 mL/min — ABNORMAL LOW (ref >90)
Glucose, Bld: 88 mg/dL (ref 70–99)
Potassium: 4 mEq/L (ref 3.5–5.1)
Sodium: 132 mEq/L — ABNORMAL LOW (ref 135–145)
Total Bilirubin: 1.7 mg/dL — ABNORMAL HIGH (ref 0.3–1.2)

## 2013-02-15 LAB — CORTISOL: Cortisol, Plasma: 8.3 ug/dL

## 2013-02-15 LAB — PREPARE RBC (CROSSMATCH)

## 2013-02-15 LAB — HEMOGLOBIN AND HEMATOCRIT, BLOOD
HCT: 19.3 % — ABNORMAL LOW (ref 36.0–46.0)
Hemoglobin: 7.1 g/dL — ABNORMAL LOW (ref 12.0–15.0)

## 2013-02-15 LAB — URINE MICROSCOPIC-ADD ON

## 2013-02-15 LAB — GLUCOSE, CAPILLARY
Glucose-Capillary: 98 mg/dL (ref 70–99)
Glucose-Capillary: 95 mg/dL (ref 70–99)
Glucose-Capillary: 76 mg/dL (ref 70–99)

## 2013-02-15 SURGERY — EGD (ESOPHAGOGASTRODUODENOSCOPY)
Anesthesia: Moderate Sedation

## 2013-02-15 MED ORDER — FENTANYL CITRATE 0.05 MG/ML IJ SOLN
50.0000 ug | INTRAMUSCULAR | Status: DC | PRN
  Administered 2013-02-15 (×2): 50 ug via INTRAVENOUS
  Filled 2013-02-15: qty 2

## 2013-02-15 MED ORDER — INSULIN ASPART 100 UNIT/ML ~~LOC~~ SOLN: 0.0000 [IU] | SUBCUTANEOUS | Status: DC

## 2013-02-15 MED ORDER — ONDANSETRON HCL 4 MG/2ML IJ SOLN
4.0000 mg | Freq: Once | INTRAMUSCULAR | Status: AC
  Administered 2013-02-15: 4 mg via INTRAVENOUS
  Filled 2013-02-15: qty 2

## 2013-02-15 MED ORDER — PIPERACILLIN-TAZOBACTAM 3.375 G IVPB
3.3750 g | Freq: Once | INTRAVENOUS | Status: AC
  Administered 2013-02-15: 3.375 g via INTRAVENOUS
  Filled 2013-02-15: qty 50

## 2013-02-15 MED ORDER — METRONIDAZOLE IN NACL 5-0.79 MG/ML-% IV SOLN
500.0000 mg | Freq: Three times a day (TID) | INTRAVENOUS | Status: DC
  Administered 2013-02-15: 500 mg via INTRAVENOUS
  Filled 2013-02-15 (×2): qty 100

## 2013-02-15 MED ORDER — MIDAZOLAM HCL 10 MG/2ML IJ SOLN
INTRAMUSCULAR | Status: DC | PRN
  Administered 2013-02-15: 1 mg via INTRAVENOUS

## 2013-02-15 MED ORDER — SODIUM CHLORIDE 0.9 % IV SOLN
INTRAVENOUS | Status: DC
  Administered 2013-02-15: 20:00:00 via INTRAVENOUS

## 2013-02-15 MED ORDER — SODIUM CHLORIDE 0.9 % IV BOLUS (SEPSIS)
1000.0000 mL | Freq: Once | INTRAVENOUS | Status: AC
  Administered 2013-02-15: 1000 mL via INTRAVENOUS

## 2013-02-15 MED ORDER — FENTANYL CITRATE 0.05 MG/ML IJ SOLN
INTRAMUSCULAR | Status: DC | PRN
  Administered 2013-02-15: 12.5 ug via INTRAVENOUS

## 2013-02-15 MED ORDER — VANCOMYCIN HCL IN DEXTROSE 750-5 MG/150ML-% IV SOLN
750.0000 mg | INTRAVENOUS | Status: DC
  Administered 2013-02-16 – 2013-02-19 (×4): 750 mg via INTRAVENOUS
  Filled 2013-02-15 (×6): qty 150

## 2013-02-15 MED ORDER — MIDAZOLAM HCL 5 MG/ML IJ SOLN
INTRAMUSCULAR | Status: AC
  Filled 2013-02-15: qty 1

## 2013-02-15 MED ORDER — COLLAGENASE 250 UNIT/GM EX OINT
TOPICAL_OINTMENT | Freq: Every day | CUTANEOUS | Status: DC
  Administered 2013-02-15: 17:00:00 via TOPICAL
  Administered 2013-02-16: 1 via TOPICAL
  Administered 2013-02-17 – 2013-02-19 (×3): via TOPICAL
  Filled 2013-02-15 (×2): qty 30

## 2013-02-15 MED ORDER — PIPERACILLIN-TAZOBACTAM 3.375 G IVPB
3.3750 g | Freq: Three times a day (TID) | INTRAVENOUS | Status: DC
  Administered 2013-02-15 – 2013-02-19 (×12): 3.375 g via INTRAVENOUS
  Filled 2013-02-15 (×16): qty 50

## 2013-02-15 MED ORDER — SODIUM CHLORIDE 0.9 % IV SOLN
8.0000 mg/h | INTRAVENOUS | Status: DC
  Administered 2013-02-15 – 2013-02-16 (×3): 8 mg/h via INTRAVENOUS
  Filled 2013-02-15 (×8): qty 80

## 2013-02-15 MED ORDER — FENTANYL CITRATE 0.05 MG/ML IJ SOLN
INTRAMUSCULAR | Status: AC
  Filled 2013-02-15: qty 2

## 2013-02-15 MED ORDER — DEXTROSE-NACL 5-0.45 % IV SOLN
INTRAVENOUS | Status: DC
  Administered 2013-02-15: 20:00:00 via INTRAVENOUS
  Administered 2013-02-16: 1000 mL via INTRAVENOUS
  Administered 2013-02-19: 04:00:00 via INTRAVENOUS

## 2013-02-15 MED ORDER — VITAMIN K1 10 MG/ML IJ SOLN
5.0000 mg | Freq: Once | INTRAVENOUS | Status: AC
  Administered 2013-02-15: 5 mg via INTRAVENOUS
  Filled 2013-02-15: qty 0.5

## 2013-02-15 MED ORDER — VITAMIN K1 10 MG/ML IJ SOLN
5.0000 mg | Freq: Once | INTRAVENOUS | Status: AC
  Administered 2013-02-16: 5 mg via INTRAVENOUS
  Filled 2013-02-15: qty 0.5

## 2013-02-15 MED ORDER — SODIUM CHLORIDE 0.9 % IJ SOLN
3.0000 mL | Freq: Two times a day (BID) | INTRAMUSCULAR | Status: DC
  Administered 2013-02-15 – 2013-02-18 (×5): 3 mL via INTRAVENOUS

## 2013-02-15 MED ORDER — ONDANSETRON HCL 4 MG/2ML IJ SOLN: 4.0000 mg | Freq: Four times a day (QID) | INTRAMUSCULAR | Status: DC | PRN

## 2013-02-15 MED ORDER — SODIUM CHLORIDE 0.9 % IV SOLN
80.0000 mg | Freq: Once | INTRAVENOUS | Status: AC
  Administered 2013-02-15: 05:00:00 80 mg via INTRAVENOUS
  Filled 2013-02-15: qty 80

## 2013-02-15 MED ORDER — VANCOMYCIN HCL IN DEXTROSE 1-5 GM/200ML-% IV SOLN
1000.0000 mg | Freq: Once | INTRAVENOUS | Status: AC
  Administered 2013-02-15: 1000 mg via INTRAVENOUS
  Filled 2013-02-15: qty 200

## 2013-02-15 MED ORDER — FENTANYL CITRATE 0.05 MG/ML IJ SOLN
12.5000 ug | INTRAMUSCULAR | Status: DC | PRN
  Administered 2013-02-16 – 2013-02-17 (×8): 25 ug via INTRAVENOUS
  Filled 2013-02-15 (×8): qty 2

## 2013-02-15 MED ORDER — KCL IN DEXTROSE-NACL 20-5-0.9 MEQ/L-%-% IV SOLN
INTRAVENOUS | Status: DC
  Administered 2013-02-15: 10:00:00 via INTRAVENOUS
  Filled 2013-02-15 (×3): qty 1000

## 2013-02-15 MED ORDER — SODIUM CHLORIDE 0.9 % IV SOLN
INTRAVENOUS | Status: DC
  Administered 2013-02-15: 05:00:00 via INTRAVENOUS

## 2013-02-15 MED ORDER — ONDANSETRON HCL 4 MG PO TABS: 4.0000 mg | ORAL_TABLET | Freq: Four times a day (QID) | ORAL | Status: DC | PRN

## 2013-02-15 MED ORDER — BUTAMBEN-TETRACAINE-BENZOCAINE 2-2-14 % EX AERO
INHALATION_SPRAY | CUTANEOUS | Status: DC | PRN
  Administered 2013-02-15: 2 via TOPICAL

## 2013-02-15 MED ORDER — SODIUM CHLORIDE 0.9 % IV BOLUS (SEPSIS)
500.0000 mL | Freq: Once | INTRAVENOUS | Status: AC
  Administered 2013-02-15: 500 mL via INTRAVENOUS

## 2013-02-15 NOTE — ED Notes (Signed)
IV team notified to attempt IV access.

## 2013-02-15 NOTE — ED Notes (Signed)
Per EMS: Pt called EMS due to pain in her back and buttocks due to pressue ulcers. Pts MD would not renew pain medication because she was unable to come to the office. Pt states she wants to be made a DNR, but MD refused to authorize it the document. Vitals: 90/pal, hyperventilation due to pain. Alertx4, NAD. Family reports low grade fever.

## 2013-02-15 NOTE — Progress Notes (Signed)
Dr. York Spaniel notified of pt's BPs -- order received for 1L bolus. [name], Viona Gilmore

## 2013-02-15 NOTE — Procedures (Signed)
Central Venous Catheter Insertion Procedure Note FLOREEN TEEGARDEN [id number] 06/13/1926  Procedure: Insertion of Central Venous Catheter Indications: Assessment of intravascular volume, Drug and/or fluid administration and Frequent blood sampling  Procedure Details Consent: Risks of procedure as well as the alternatives and risks of each were explained to the (patient/caregiver).  Consent for procedure obtained. Time Out: Verified patient identification, verified procedure, site/side was marked, verified correct patient position, special equipment/implants available, medications/allergies/relevent history reviewed, required imaging and test results available.  Performed  Maximum sterile technique was used including antiseptics, cap, gloves, gown, hand hygiene, mask and sheet. Skin prep: Chlorhexidine; local anesthetic administered A antimicrobial bonded/coated triple lumen catheter was placed in the left internal jugular vein using the Seldinger technique.  Evaluation Blood flow good Complications: No apparent complications Patient did tolerate procedure well. Chest X-ray ordered to verify placement.  CXR: pending.  BABCOCK,PETE 02/15/2013, 4:03 PM  PCCM ATTENDING: I have interviewed and examined the patient and reviewed the database. I have formulated the assessment and plan as reflected in the note above with amendments made by me.  Billy Fischer, MD;  PCCM service; Mobile [phone]

## 2013-02-15 NOTE — ED Notes (Signed)
Pt had R BKA performed October 1st per daughter. States she was residing at [facility] prior to that due to a wound. Pt had appointment at [facility] this Wednesday but was unable to go because of transportation. Because patient missed her appointment she was unable to get her prescriptions refilled. Daughter reports patient has pressure ulcer on her bottom. Daughter's Phone number [phone].

## 2013-02-15 NOTE — Consult Note (Addendum)
PULMONARY  / CRITICAL CARE MEDICINE  Name: Angelica Lawrence MRN: [id number] DOB: [birth year]    ADMISSION DATE:  02/15/2013 CONSULTATION DATE:  12/7  REFERRING MD :  [facility]  PRIMARY SERVICE: triad>>>PCCM   CHIEF COMPLAINT:  Persistent hypotension/shock   BRIEF PATIENT DESCRIPTION:  79 year old female w/ multiple co-morbids PVD, RLE osteo,  DM, CKD, bilateral BKA, Most recently the right which was complicated by wound dehiscence being treated w/ santyl dressing changes. Just d/c'd from hospital on 11/28 after being admitted for dehydration and acute on chronic renal failure. Admitted to [facility] on 12/7 w/ CC; bilateral leg pain and dark stools. 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 PCCM asked to see later in the day on 12/7 for persistent hypotension in spite of fluid resuscitation efforts.   SIGNIFICANT EVENTS / STUDIES:    LINES / TUBES:   CULTURES: UC 12/7>>> BCX2 12/7>>> R stump culture 12/7>>>  ANTIBIOTICS: vanc 12/6>>> Zosyn 12/6>>> Flagyl 12/6>>>  HISTORY OF PRESENT ILLNESS:    79 y.o. female with Past medical history of coronary artery disease, hypertension, peripheral vascular disease,bilateral amputation (most recently 10/1 RLE d/t osteo) , diabetes, recent history of DVT after leg amputation, acute on chronic kidney disease, and RLE wound dehiscence during her hospitalization from 11/21-11/28. Presented 12/7 with cc: of bilateral stump pain.  Her only complaint is bilateral stump pain which has been ongoing ever since the surgery. She was on pain medications at home but she ran out of the medications and since she could not go to the PCPs office she did not have a BP of the pain medication and that's why she was in a lot of pain. The daughter mentioned the patient had black color bowel movement even before her prior discharge but her hemoglobin remained consistently within the same range during the whole hospitalization.   On initial  eval her Fecal occult blood was positive and she was noted to have INR of 5.86. Her right surgical site from Right BKA for osteo, was noted for wound dehiscence which is actively being treated with Santyl dressing changes. Her initial SBP was noted to be in the 90s. Interventions initially included: FFP, vitamin K, and IVFs as well as empiric abx for possible stump infection. She was admitted to the SDU setting. Her BP remained in the 80s-90s in spite of fluid resuscitation. PCCM asked to see for persistent hypotension.   PAST MEDICAL HISTORY :  Past Medical History  Diagnosis Date  . ALLERGIC RHINITIS 12/30/2006  . ANEMIA, IRON DEFICIENCY 10/21/2009  . COLONIC POLYPS, HX OF 12/30/2006  . CORONARY ARTERY DISEASE 12/30/2006  . DIVERTICULOSIS, COLON 12/30/2006  . HYPERLIPIDEMIA 12/30/2006  . HYPERTENSION 12/30/2006  . PERIPHERAL NEUROPATHY 12/30/2006  . PERIPHERAL VASCULAR DISEASE 12/30/2006  . GOITER 12/30/2006  . VITAMIN B12 DEFICIENCY 09/16/2008  . Carpal tunnel syndrome 12/30/2006  . HEMORRHOIDS 12/30/2006  . DEGENERATIVE JOINT DISEASE 02/10/2007  . INSOMNIA 10/21/2009  . CORNEAL TRANSPLANT 12/30/2006    family denies this hx on 12/10/2012  . DVT (deep venous thrombosis) 10/2012    "left leg" (12/10/2012)  . Type II diabetes mellitus   . Stroke 1970's?    denies residual on 12/10/2012   Past Surgical History  Procedure Laterality Date  . Appendectomy    . Cholecystectomy    . Abdominal hysterectomy  1960's    and bso  . Below knee leg amputation Left 1989  . Coronary artery bypass graft  2003    CABG X3" (12/10/2012)  . Cardiac catheterization      "once" (12/10/2012)  . Cataract extraction, bilateral Bilateral   . Below knee leg amputation Right 12/10/2012  . Amputation Right 12/10/2012    Procedure: AMPUTATION BELOW RIGHT KNEE;  Surgeon: Nadara Mustard, MD;  Location: [facility] OR;  Service: Orthopedics;  Laterality: Right;   Prior to Admission medications   Medication Sig Start Date End  Date Taking? Authorizing Provider  atenolol (TENORMIN) 12.5 mg TABS tablet Take 0.5 tablets (12.5 mg total) by mouth daily. 02/06/13   Joseph Art, DO  bisacodyl (DULCOLAX) 10 MG suppository Place 1 suppository (10 mg total) rectally daily as needed for mild constipation or moderate constipation. 02/06/13   Joseph Art, DO  collagenase (SANTYL) ointment Apply topically daily. 02/06/13   Joseph Art, DO  ferrous sulfate ([facility] IRON) 325 (65 FE) MG tablet Take 325 mg by mouth daily.      Historical Provider, MD  HYDROcodone-acetaminophen (NORCO/VICODIN) 5-325 MG per tablet Take 1 tablet by mouth every 4 (four) hours as needed for pain.    Historical Provider, MD  megestrol (MEGACE) 400 MG/10ML suspension Take 10 mLs (400 mg total) by mouth daily. 02/06/13   Joseph Art, DO  Multiple Vitamin (MULTIVITAMIN WITH MINERALS) TABS tablet Take 1 tablet by mouth daily.     Historical Provider, MD  omeprazole (PRILOSEC) 20 MG capsule Take 1 capsule (20 mg total) by mouth daily. 08/18/12   [name], MD  polyethylene glycol (MIRALAX / Ethelene Hal) packet Take 17 g by mouth daily as needed for mild constipation. 02/06/13   Joseph Art, DO  simvastatin (ZOCOR) 40 MG tablet Take 1 tablet (40 mg total) by mouth at bedtime. 08/18/12   [name], MD  traZODone (DESYREL) 100 MG tablet Take 1 tablet (100 mg total) by mouth at bedtime. 04/17/12   [name], MD   No Known Allergies  FAMILY HISTORY:  Family History  Problem Relation Age of Onset  . Cancer Neg Hx   . Heart disease Neg Hx    SOCIAL HISTORY:  reports that she has never smoked. She has never used smokeless tobacco. She reports that she does not drink alcohol or use illicit drugs.  REVIEW OF SYSTEMS:   Only per pos was stump pain.   SUBJECTIVE:  Comfortable but still hypotensive   VITAL SIGNS: Temp:  [97.7 F (36.5 C)-98.7 F (37.1 C)] 98.3 F (36.8 C) (12/07 1135) Pulse Rate:  [64-98] 64 (12/07 1400) Resp:  [13-39] 18 (12/07  1400) BP: (77-120)/(35-90) 84/41 mmHg (12/07 1400) SpO2:  [92 %-100 %] 95 % (12/07 1400) Weight:  [65.1 kg (143 lb 8.3 oz)-66.2 kg (145 lb 15.1 oz)] 66.2 kg (145 lb 15.1 oz) (12/07 0534) HEMODYNAMICS:   VENTILATOR SETTINGS:   INTAKE / OUTPUT: Intake/Output     12/06 0701 - 12/07 0700 12/07 0701 - 12/08 0700   I.V. (mL/kg)  550 (8.3)   Blood  546.7   IV Piggyback  1112.5   Total Intake(mL/kg)  2209.2 (33.4)   Urine (mL/kg/hr)  200 (0.4)   Total Output   200   Net   +2009.2          PHYSICAL EXAMINATION: General:  Chronically ill appearing AAF, no acute distress  Neuro:  Alert, oriented. Generalized weakness  HEENT:  Poor dentition no JVD  Cardiovascular:  rrr Lungs:  Dec on the right  Abdomen: hypoactive + bowel sounds  Musculoskeletal:  RLE amp w/ necrotic incision site and purulent foul smelling d/c Skin:  See above  LABS:  CBC  Recent Labs Lab 02/15/13 0045 02/15/13 1318  WBC 11.4*  --   HGB 9.0* 7.1*  HCT 24.9* 19.3*  PLT 317  --    Coag's  Recent Labs Lab 02/15/13 0136 02/15/13 1318  INR 5.86* 1.84*   BMET  Recent Labs Lab 02/15/13 0045  NA 132*  K 4.0  CL 103  CO2 17*  BUN 32*  CREATININE 1.87*  GLUCOSE 88   Electrolytes  Recent Labs Lab 02/15/13 0045  CALCIUM 7.9*   Sepsis Markers  Recent Labs Lab 02/15/13 0251  LATICACIDVEN 3.37*   ABG No results found for this basename: PHART, PCO2ART, PO2ART,  in the last 168 hours Liver Enzymes  Recent Labs Lab 02/15/13 0045  AST 46*  ALT 21  ALKPHOS 125*  BILITOT 1.7*  ALBUMIN 1.7*   Cardiac Enzymes No results found for this basename: TROPONINI, PROBNP,  in the last 168 hours Glucose  Recent Labs Lab 02/15/13 0759 02/15/13 1207  GLUCAP 98 95    Imaging Dg Chest Portable 1 View  02/15/2013   CLINICAL DATA:  Back pain.  EXAM: PORTABLE CHEST - 1 VIEW  COMPARISON:  01/30/2013  FINDINGS: Increased densities at the right lung base are suggestive for pleural fluid and  possibly airspace disease. Prominent interstitial markings in the lower chest could represent dependent edema. Heart size is stable. Median sternotomy wires are present. Chronic changes in left shoulder.  IMPRESSION: Increased densities at the right lung base are concerning for pleural fluid and airspace disease/consolidation.  Prominent interstitial markings in lower chest could represent dependent edema.   Electronically Signed   By: Richarda Overlie M.D.   On: 02/15/2013 03:19     CXR: decreased aeration on right. ATX vs effusion. Actually improved when c/w film from prior hospitalization   ASSESSMENT / PLAN:  PULMONARY A:  Atelectasis  >possible effusion.  P:   Will eval right chest w/ [place]. If sig effusion burden might consider thora   CARDIOVASCULAR A:  Shock. Primary etiology is likely hypovolemia/ hemorrhagic in setting of coagulopathy & GIB. In setting of stump infection and pyuria also can't exclude sepsis  P:  Transfer to ICU Ck lactate Transfuse in setting shock  Consider echo if no improvement  Ck lactate   RENAL A:   Acute on chronic renal failure  Baseline CKD stage 3A,  w/ BL scr 1.0 Metabolic acidosis  P:   IV fluid resuscitation  Central access Renal dose meds   GASTROINTESTINAL A:   Acute UGIB in setting of coumadin induced coagulopathy, suspect gastritis P:   PPI  NPO  GI services following   HEMATOLOGIC A:   Acute blood loss anemia  hgb drop from 9-->7.1 Coumadin induced coagulopathy Recent LLE DVT  P:  Transfuse blood and FFP Will need to decide on IVC, might be a safer option given risk of life threatening bleeding and already demonstrated difficulty w/ maintaining INR    INFECTIOUS A:   Stump infection: Right amputation  >seen by WOC P:   Dressing changes per WOC Need to alert Ortho of admit on 12/8  ENDOCRINE A:   No acute issue   P:   Trend glucose  Ck cortisol   NEUROLOGIC A:   No focal issue  P:   Supportive care    TODAY'S SUMMARY: Volume responsive. + GI bleed, prob infected stump so add sepsis to  ddx. Will place CVL, correct coagulopathy, transfuse blood, cont abx. Will need Dr [name] to see on 12/8 re: wound   I have personally obtained a history, examined the patient, evaluated laboratory and imaging results, formulated the assessment and plan and placed orders. CRITICAL CARE: The patient is critically ill with multiple organ systems failure and requires high complexity decision making for assessment and support, frequent evaluation and titration of therapies, application of advanced monitoring technologies and extensive interpretation of multiple databases. Critical Care Time devoted to patient care services described in this note is --- minutes.    PCCM ATTENDING: I have interviewed and examined the patient and reviewed the database. I have formulated the assessment and plan as reflected in the note above with amendments made by me.   Billy Fischer, MD;  PCCM service; Mobile [phone]   Billy Fischer, MD ; [facility] service Mobile [phone].  After 5:30 PM or weekends, call [phone]

## 2013-02-15 NOTE — Consult Note (Signed)
WOC wound consult note Reason for Consult:Right stump wound Wound type: Pressure Ulcer POA: No Measurement: 6cm x 19cm x 0.4cm Wound bed: 50% necrotic eschar, 50% pink, non-healing tissue Drainage (amount, consistency, odor) light yellow exudate, small amount Periwound: intact; there is one staple remaining at the medial incision line Dressing procedure/placement/frequency: I will begin collagenase (Santyl) as an enzymatic debriding agent topped with a saline dampened dressing.  This will be performed daily for two weeks in an attempt to clean up this wound bed and free it of necrotic tissue so that wound healing can resume.  Of course of note is her overall condition at the moment; she is in ICU and staff is preparing to administer blood products. Her perfusion and oxygenation is compromised. The ICU NP has been asked to notify Dr. [name] (who performed her surgery) to let him know that she is in house. WOC nursing team will not follow, but will remain available to this patient, the nursing and medical/surgical team.  Please reconsult if needed. Thanks, Ladona Mow, MSN, RN, GNP, [place], CWON-AP [phone])

## 2013-02-15 NOTE — ED Notes (Signed)
POCT occult blood stool-Positive

## 2013-02-15 NOTE — ED Provider Notes (Signed)
CSN: [id number]     Arrival date & time 02/15/13  0017 History   First MD Initiated Contact with Patient 02/15/13 0021     Chief Complaint  Patient presents with  . Back Pain   (Consider location/radiation/quality/duration/timing/severity/associated sxs/prior Treatment) HPI History per EMS and daughter with very limited history per patient. Lives at home with daughter, recent right BKA, also known pressure ulcers - is bedbound. Patient has ongoing pain in the area of her pressure ulcers, was unable to see her primary care physician this week due to transportation issues, specifically unable to get pain medications.  She has had ongoing pain the last week and not EMS was called for the same. Report low-grade fevers at home. No vomiting or diarrhea. Patient provides minimal history and level 5 caveat applies. Past Medical History  Diagnosis Date  . ALLERGIC RHINITIS 12/30/2006  . ANEMIA, IRON DEFICIENCY 10/21/2009  . COLONIC POLYPS, HX OF 12/30/2006  . CORONARY ARTERY DISEASE 12/30/2006  . DIVERTICULOSIS, COLON 12/30/2006  . HYPERLIPIDEMIA 12/30/2006  . HYPERTENSION 12/30/2006  . PERIPHERAL NEUROPATHY 12/30/2006  . PERIPHERAL VASCULAR DISEASE 12/30/2006  . GOITER 12/30/2006  . VITAMIN B12 DEFICIENCY 09/16/2008  . Carpal tunnel syndrome 12/30/2006  . HEMORRHOIDS 12/30/2006  . DEGENERATIVE JOINT DISEASE 02/10/2007  . INSOMNIA 10/21/2009  . CORNEAL TRANSPLANT 12/30/2006    family denies this hx on 12/10/2012  . DVT (deep venous thrombosis) 10/2012    "left leg" (12/10/2012)  . Type II diabetes mellitus   . Stroke 1970's?    denies residual on 12/10/2012   Past Surgical History  Procedure Laterality Date  . Appendectomy    . Cholecystectomy    . Abdominal hysterectomy  1960's    and bso  . Below knee leg amputation Left 1989  . Coronary artery bypass graft  2003    CABG X3" (12/10/2012)  . Cardiac catheterization      "once" (12/10/2012)  . Cataract extraction, bilateral Bilateral   .  Below knee leg amputation Right 12/10/2012  . Amputation Right 12/10/2012    Procedure: AMPUTATION BELOW RIGHT KNEE;  Surgeon: Nadara Mustard, MD;  Location: [facility] OR;  Service: Orthopedics;  Laterality: Right;   Family History  Problem Relation Age of Onset  . Cancer Neg Hx   . Heart disease Neg Hx    History  Substance Use Topics  . Smoking status: Never Smoker   . Smokeless tobacco: Never Used  . Alcohol Use: No   OB History   Grav Para Term Preterm Abortions TAB SAB Ect Mult Living                 Review of Systems  Unable to perform ROS  level V caveat as above  Allergies  Review of patient's allergies indicates no known allergies.  Home Medications   Current Outpatient Rx  Name  Route  Sig  Dispense  Refill  . atenolol (TENORMIN) 12.5 mg TABS tablet   Oral   Take 0.5 tablets (12.5 mg total) by mouth daily.   30 tablet   0   . bisacodyl (DULCOLAX) 10 MG suppository   Rectal   Place 1 suppository (10 mg total) rectally daily as needed for mild constipation or moderate constipation.   12 suppository   0   . collagenase (SANTYL) ointment   Topical   Apply topically daily.   15 g   0   . ferrous sulfate (CVS IRON) 325 (65 FE) MG tablet   Oral  Take 325 mg by mouth daily.           Marland Kitchen HYDROcodone-acetaminophen (NORCO/VICODIN) 5-325 MG per tablet   Oral   Take 1 tablet by mouth every 4 (four) hours as needed for pain.         . megestrol (MEGACE) 400 MG/10ML suspension   Oral   Take 10 mLs (400 mg total) by mouth daily.   240 mL   0   . Multiple Vitamin (MULTIVITAMIN WITH MINERALS) TABS tablet   Oral   Take 1 tablet by mouth daily.          Marland Kitchen omeprazole (PRILOSEC) 20 MG capsule   Oral   Take 1 capsule (20 mg total) by mouth daily.   30 capsule   0   . polyethylene glycol (MIRALAX / GLYCOLAX) packet   Oral   Take 17 g by mouth daily as needed for mild constipation.   14 each   0   . simvastatin (ZOCOR) 40 MG tablet   Oral   Take 1 tablet  (40 mg total) by mouth at bedtime.   30 tablet   0   . traZODone (DESYREL) 100 MG tablet   Oral   Take 1 tablet (100 mg total) by mouth at bedtime.   30 tablet   3    There were no vitals taken for this visit. Physical Exam  Constitutional: She appears well-developed and well-nourished.  HENT:  Head: Normocephalic and atraumatic.  Eyes: EOM are normal. Pupils are equal, round, and reactive to light.  Neck: Neck supple.  Cardiovascular: Normal rate, regular rhythm and intact distal pulses.   Pulmonary/Chest: Effort normal and breath sounds normal. No respiratory distress.  Genitourinary:  Stage II sacral decubitus ulcers, with a large amount of surrounding liquid stool  Musculoskeletal:  Right BKA - some open areas with granulation tissue and some purulent discharge. Left BKA - healed.  Neurological:  Awake, alert and oriented  Skin: Skin is warm and dry.    ED Course  Angiocath insertion Date/Time: 02/15/2013 3:53 AM Performed by: Sunnie Nielsen Authorized by: Sunnie Nielsen Consent: Verbal consent obtained. Risks and benefits: risks, benefits and alternatives were discussed Consent given by: patient Patient understanding: patient states understanding of the procedure being performed Patient consent: the patient's understanding of the procedure matches consent given Procedure consent: procedure consent matches procedure scheduled Required items: required blood products, implants, devices, and special equipment available Patient identity confirmed: verbally with patient Time out: Immediately prior to procedure a "time out" was called to verify the correct patient, procedure, equipment, support staff and site/side marked as required. Preparation: Patient was prepped and draped in the usual sterile fashion. Patient tolerance: Patient tolerated the procedure well with no immediate complications. Comments: 20 G EJ R sided   (including critical care time) Labs Review Labs  Reviewed  CBC - Abnormal; Notable for the following:    WBC 11.4 (*)    RBC 3.03 (*)    Hemoglobin 9.0 (*)    HCT 24.9 (*)    MCHC 36.1 (*)    RDW 17.4 (*)    All other components within normal limits  COMPREHENSIVE METABOLIC PANEL - Abnormal; Notable for the following:    Sodium 132 (*)    CO2 17 (*)    BUN 32 (*)    Creatinine, Ser 1.87 (*)    Calcium 7.9 (*)    Albumin 1.7 (*)    AST 46 (*)    Alkaline Phosphatase 125 (*)  Total Bilirubin 1.7 (*)    GFR calc non Af Amer 24 (*)    GFR calc Af Amer 27 (*)    All other components within normal limits  PROTIME-INR - Abnormal; Notable for the following:    Prothrombin Time 50.1 (*)    INR 5.86 (*)    All other components within normal limits  CG4 I-STAT (LACTIC ACID) - Abnormal; Notable for the following:    Lactic Acid, Venous 3.37 (*)    All other components within normal limits  URINALYSIS, ROUTINE W REFLEX MICROSCOPIC  TYPE AND SCREEN  PREPARE FRESH FROZEN PLASMA  PREPARE FRESH FROZEN PLASMA   Imaging Review Dg Chest Portable 1 View  02/15/2013   CLINICAL DATA:  Back pain.  EXAM: PORTABLE CHEST - 1 VIEW  COMPARISON:  01/30/2013  FINDINGS: Increased densities at the right lung base are suggestive for pleural fluid and possibly airspace disease. Prominent interstitial markings in the lower chest could represent dependent edema. Heart size is stable. Median sternotomy wires are present. Chronic changes in left shoulder.  IMPRESSION: Increased densities at the right lung base are concerning for pleural fluid and airspace disease/consolidation.  Prominent interstitial markings in lower chest could represent dependent edema.   Electronically Signed   By: Richarda Overlie M.D.   On: 02/15/2013 03:19   CRITICAL CARE Performed by: Sunnie Nielsen Total critical care time: 40 Critical care time was exclusive of separately billable procedures and treating other patients. Critical care was necessary to treat or prevent imminent or  life-threatening deterioration. Critical care was time spent personally by me on the following activities: development of treatment plan with patient and/or surrogate as well as nursing, discussions with consultants, evaluation of patient's response to treatment, examination of patient, obtaining history from patient or surrogate, ordering and performing treatments and interventions, ordering and review of laboratory studies, ordering and review of radiographic studies, pulse oximetry and re-evaluation of patient's condition.  Vit K, FFP, IVFs IV ABX  Dr Allena Katz evaluated bedside, plan admit SDU - GI will follow MDM  Dx: GI Bleeding, hypotension requiring IVF resus, elevated INR requiring reversal, lactic acidosis  Labs, imaging obtained/ reviewed Medications provided MED admit   Sunnie Nielsen, MD 02/15/13 2338

## 2013-02-15 NOTE — Progress Notes (Signed)
Assisted RN to transfer patient to 28M 13, via bed with monitor on.  Patient tolerated well

## 2013-02-15 NOTE — Consult Note (Signed)
Consultation   History of Present Illness:  This is a 79 yo AA female brought to ED with wound infection, at the area of amputation L BK. She was noted to have dark stool, Hgb 9.0 and INR 5.6 on Coumadin for AF. She was on antibiotics recently. She denies taking Iron. She was not aware of passing black stool. Pt denies hx of ulcer, abd. Pain N&V.  She has a hx of colon polyps on remote colonoscopy- record not available in EPIC. She was initially mildly hypotensive but has stabilized in  Step down unit. There have been no stools. She is currently finishing first unit of FFP.   Past Medical History  Diagnosis Date  . ALLERGIC RHINITIS 12/30/2006  . ANEMIA, IRON DEFICIENCY 10/21/2009  . COLONIC POLYPS, HX OF 12/30/2006  . CORONARY ARTERY DISEASE 12/30/2006  . DIVERTICULOSIS, COLON 12/30/2006  . HYPERLIPIDEMIA 12/30/2006  . HYPERTENSION 12/30/2006  . PERIPHERAL NEUROPATHY 12/30/2006  . PERIPHERAL VASCULAR DISEASE 12/30/2006  . GOITER 12/30/2006  . VITAMIN B12 DEFICIENCY 09/16/2008  . Carpal tunnel syndrome 12/30/2006  . HEMORRHOIDS 12/30/2006  . DEGENERATIVE JOINT DISEASE 02/10/2007  . INSOMNIA 10/21/2009  . CORNEAL TRANSPLANT 12/30/2006    family denies this hx on 12/10/2012  . DVT (deep venous thrombosis) 10/2012    "left leg" (12/10/2012)  . Type II diabetes mellitus   . Stroke 1970's?    denies residual on 12/10/2012   Past Surgical History  Procedure Laterality Date  . Appendectomy    . Cholecystectomy    . Abdominal hysterectomy  1960's    and bso  . Below knee leg amputation Left 1989  . Coronary artery bypass graft  2003    CABG X3" (12/10/2012)  . Cardiac catheterization      "once" (12/10/2012)  . Cataract extraction, bilateral Bilateral   . Below knee leg amputation Right 12/10/2012  . Amputation Right 12/10/2012    Procedure: AMPUTATION BELOW RIGHT KNEE;  Surgeon: Nadara Mustard, MD;  Location: [facility] OR;  Service: Orthopedics;  Laterality: Right;    reports that she has  never smoked. She has never used smokeless tobacco. She reports that she does not drink alcohol or use illicit drugs. family history is negative for Cancer and Heart disease. No Known Allergies      Review of Systems:  The remainder of the 10 point ROS is negative except as outlined in H&P   Physical Exam: General appearance  thin, in no distress. Eyes- non icteric. HEENT nontraumatic, normocephalic. Mouth no lesions, tongue papillated, no cheilosis. Neck supple without adenopathy, thyroid not enlarged, no carotid bruits, no JVD. Lungs Clear to auscultation bilaterally. Cor normal S1, normal S2, regular rhythm, no murmur,  quiet precordium. Abdomen: soft, scaphoid, soft bowl sounds, liver edge at CM, post chole scars Rectal:large amount of impacted dark heme positive stool Extremities no pedal edema., s/p bilat BKA, dressing on left stump Skin no lesions. Neurological alert and oriented x 3. Psychological normal mood and affect.  Assessment and Plan:  79 yo AA female with multiple medical problems who has supratherapeutic INR while on Coumadin, she is heme positive and anemic,  But hemodynamically stable. She will complete 2 units of FFP and when her INR  Decreases to 2.0 we will proceed with EGD. She is on IV PPI,. Suspect gastritis, PUD or avm.Discuss safety of anticoagulation at her age and the clinical setting, risks vs benefits  Fecal impaction- will initiate laxative regimen after EGD- she is now NPO  02/15/2013 Angelica Lawrence

## 2013-02-15 NOTE — Progress Notes (Addendum)
[facility] PROGRESS NOTE  Angelica Lawrence WUJ:[id number] DOB: 1926-08-17 DOA: 02/15/2013 PCP: [name], MD  Assessment/Plan: 79 y.o. female with PMH of CAD s/p CABG, HTN , HPL, CKD, anemia, DM, PAD, s/p bilateral leg amputation, recent history of DVT after leg amputation admitted with complaints of bilateral stump pain, GIB possible sepsis   1. GIB on coumadin; s/p FFP, IV vit K; on IV PPI -possible EGD/colon per GI;   2. Acute blood los anemia due to GIB;  -monitor H/H TF prn; endoscopy as above   3. H/o DVT; current holding coumadin with active bleeding  -repeat LE [place] for IVC filter eval; f/u endoscopy results and GI input for coumadin treatment in future   4. PAD s/p BL leg amputation; L stump wounds not healing well; wound care, will ask ortho eval;   5. Probable pneumonia/Sepsis/hypotension;  -cont IVF resuscitation; IV atx; obtain CT chest for better evaluation; pend UA; if BP remains low may need pressure support   6. AKI likely prerenal/dehydration;  -IVF, close monitor renal; function   7. CAD s/p CABG; cont BB  8. DM HA1c-6.6 (10/2012) -cont ISS  Code Status: partial  Family Communication: none at the bedside (indicate person spoken with, relationship, and if by phone, the number) Disposition Plan: home when ready   Consultants:  GI  ortho  Procedures:  CT chest   Antibiotics:  Zosyn 12/7<<<<  vanc 12/7<<<<  Flagyl 12/7<<<   (indicate start date, and stop date if known)  HPI/Subjective: alert  Objective: Filed Vitals:   02/15/13 0750  BP: 99/37  Pulse: 90  Temp: 98 F (36.7 C)  Resp: 17    Intake/Output Summary (Last 24 hours) at 02/15/13 0828 Last data filed at 02/15/13 0800  Gross per 24 hour  Intake   37.5 ml  Output      0 ml  Net   37.5 ml   Filed Weights   02/15/13 0500 02/15/13 0534  Weight: 65.1 kg (143 lb 8.3 oz) 66.2 kg (145 lb 15.1 oz)    Exam:   General:  alert  Cardiovascular: s1,s2 rrr  Respiratory:  CTA BL  Abdomen: soft, nt, nd  Musculoskeletal: BL BKA   Data Reviewed: Basic Metabolic Panel:  Recent Labs Lab 02/15/13 0045  NA 132*  K 4.0  CL 103  CO2 17*  GLUCOSE 88  BUN 32*  CREATININE 1.87*  CALCIUM 7.9*   Liver Function Tests:  Recent Labs Lab 02/15/13 0045  AST 46*  ALT 21  ALKPHOS 125*  BILITOT 1.7*  PROT 6.2  ALBUMIN 1.7*   No results found for this basename: LIPASE, AMYLASE,  in the last 168 hours No results found for this basename: AMMONIA,  in the last 168 hours CBC:  Recent Labs Lab 02/15/13 0045  WBC 11.4*  HGB 9.0*  HCT 24.9*  MCV 82.2  PLT 317   Cardiac Enzymes: No results found for this basename: CKTOTAL, CKMB, CKMBINDEX, TROPONINI,  in the last 168 hours BNP (last 3 results)  Recent Labs  10/16/12 2045 12/16/12 2006  PROBNP 1528.0* 18490.0*   CBG:  Recent Labs Lab 02/15/13 0759  GLUCAP 98    No results found for this or any previous visit (from the past 240 hour(s)).   Studies: Dg Chest Portable 1 View  02/15/2013   CLINICAL DATA:  Back pain.  EXAM: PORTABLE CHEST - 1 VIEW  COMPARISON:  01/30/2013  FINDINGS: Increased densities at the right lung base are suggestive for pleural  fluid and possibly airspace disease. Prominent interstitial markings in the lower chest could represent dependent edema. Heart size is stable. Median sternotomy wires are present. Chronic changes in left shoulder.  IMPRESSION: Increased densities at the right lung base are concerning for pleural fluid and airspace disease/consolidation.  Prominent interstitial markings in lower chest could represent dependent edema.   Electronically Signed   By: Richarda Overlie M.D.   On: 02/15/2013 03:19    Scheduled Meds: . metronidazole  500 mg Intravenous Q8H  . sodium chloride  500 mL Intravenous Once  . sodium chloride  3 mL Intravenous Q12H   Continuous Infusions: . sodium chloride 125 mL/hr at 02/15/13 0445  . pantoprozole (PROTONIX) infusion 8 mg/hr (02/15/13  0536)    Principal Problem:   GI bleed Active Problems:   PERIPHERAL NEUROPATHY   HYPERTENSION   PERIPHERAL VASCULAR DISEASE   DM (diabetes mellitus)   Acute on chronic renal failure   DVT (deep venous thrombosis)   Hemorrhage of gastrointestinal tract, unspecified    Time spent: >35 minutes     Esperanza Sheets  [facility] Pager [phone]. If 7PM-7AM, please contact night-coverage at [email or web address], password [facility] 02/15/2013, 8:28 AM  LOS: 0 days

## 2013-02-15 NOTE — Progress Notes (Signed)
I have been notified by Dr Allena Katz of pt passing melenic stool in the setting of  B.P  87/60, INR 5.86, , Hgb 9.0., advised to reverse the anticoagulation  Prior to attempting diagnostic EGD. She will be receiving 2 units of FFP and we will reassess for EGD when INR < 2.0. Will repeat Ptime 1 hour after FFP infusion.

## 2013-02-15 NOTE — Progress Notes (Signed)
D/w Dr. Billy Fischer agreed to TF to ICU; patient needs pressor support, possible central line and close ICU monitoring   Rui Wordell N

## 2013-02-15 NOTE — Progress Notes (Signed)
ANTIBIOTIC CONSULT NOTE - INITIAL  Pharmacy Consult for Vancocin and Zosyn Indication: wound infection  No Known Allergies  Patient Measurements: Height: 5\' 7"  (170.2 cm) Weight: 143 lb 8.3 oz (65.1 kg) IBW/kg (Calculated) : 61.6  Vital Signs: Temp: 98.6 F (37 C) (12/07 0155) Temp src: Rectal (12/07 0155) BP: 100/43 mmHg (12/07 0430) Pulse Rate: 92 (12/07 0430)  Labs:  Recent Labs  02/15/13 0045  WBC 11.4*  HGB 9.0*  PLT 317  CREATININE 1.87*   Estimated Creatinine Clearance: 21.8 ml/min (by C-G formula based on Cr of 1.87).   Microbiology: Recent Results (from the past 720 hour(s))  MRSA PCR SCREENING     Status: None   Collection Time    01/30/13 11:19 PM      Result Value Range Status   MRSA by PCR NEGATIVE  NEGATIVE Final   Comment:            The GeneXpert MRSA Assay (FDA     approved for NASAL specimens     only), is one component of a     comprehensive MRSA colonization     surveillance program. It is not     intended to diagnose MRSA     infection nor to guide or     monitor treatment for     MRSA infections.  WOUND CULTURE     Status: None   Collection Time    01/31/13 11:04 AM      Result Value Range Status   Specimen Description WOUND   Final   Special Requests Normal RIGHT BKA STUMP   Final   Gram Stain     Final   Value: NO WBC SEEN     NO SQUAMOUS EPITHELIAL CELLS SEEN     FEW GRAM NEGATIVE RODS     Performed at [facility]   Culture     Final   Value: ABUNDANT KLEBSIELLA PNEUMONIAE     ABUNDANT METHICILLIN RESISTANT STAPHYLOCOCCUS AUREUS     Note: RIFAMPIN AND GENTAMICIN SHOULD NOT BE USED AS SINGLE DRUGS FOR TREATMENT OF STAPH INFECTIONS. This organism DOES NOT demonstrate inducible Clindamycin resistance in vitro. CRITICAL RESULT CALLED TO, READ BACK BY AND VERIFIED WITH: MAKENZIE RN BY      INGRAM A 02/03/13 10AM     Performed at [facility]   Report Status 02/03/2013 FINAL   Final   Organism ID, Bacteria  KLEBSIELLA PNEUMONIAE   Final   Organism ID, Bacteria METHICILLIN RESISTANT STAPHYLOCOCCUS AUREUS   Final    Medical History: Past Medical History  Diagnosis Date  . ALLERGIC RHINITIS 12/30/2006  . ANEMIA, IRON DEFICIENCY 10/21/2009  . COLONIC POLYPS, HX OF 12/30/2006  . CORONARY ARTERY DISEASE 12/30/2006  . DIVERTICULOSIS, COLON 12/30/2006  . HYPERLIPIDEMIA 12/30/2006  . HYPERTENSION 12/30/2006  . PERIPHERAL NEUROPATHY 12/30/2006  . PERIPHERAL VASCULAR DISEASE 12/30/2006  . GOITER 12/30/2006  . VITAMIN B12 DEFICIENCY 09/16/2008  . Carpal tunnel syndrome 12/30/2006  . HEMORRHOIDS 12/30/2006  . DEGENERATIVE JOINT DISEASE 02/10/2007  . INSOMNIA 10/21/2009  . CORNEAL TRANSPLANT 12/30/2006    family denies this hx on 12/10/2012  . DVT (deep venous thrombosis) 10/2012    "left leg" (12/10/2012)  . Type II diabetes mellitus   . Stroke 1970's?    denies residual on 12/10/2012    Medications:  Prescriptions prior to admission  Medication Sig Dispense Refill  . atenolol (TENORMIN) 12.5 mg TABS tablet Take 0.5 tablets (12.5 mg total) by mouth daily.  30  tablet  0  . bisacodyl (DULCOLAX) 10 MG suppository Place 1 suppository (10 mg total) rectally daily as needed for mild constipation or moderate constipation.  12 suppository  0  . collagenase (SANTYL) ointment Apply topically daily.  15 g  0  . ferrous sulfate ([facility] IRON) 325 (65 FE) MG tablet Take 325 mg by mouth daily.        Marland Kitchen HYDROcodone-acetaminophen (NORCO/VICODIN) 5-325 MG per tablet Take 1 tablet by mouth every 4 (four) hours as needed for pain.      . megestrol (MEGACE) 400 MG/10ML suspension Take 10 mLs (400 mg total) by mouth daily.  240 mL  0  . Multiple Vitamin (MULTIVITAMIN WITH MINERALS) TABS tablet Take 1 tablet by mouth daily.       Marland Kitchen omeprazole (PRILOSEC) 20 MG capsule Take 1 capsule (20 mg total) by mouth daily.  30 capsule  0  . polyethylene glycol (MIRALAX / GLYCOLAX) packet Take 17 g by mouth daily as needed for mild  constipation.  14 each  0  . simvastatin (ZOCOR) 40 MG tablet Take 1 tablet (40 mg total) by mouth at bedtime.  30 tablet  0  . traZODone (DESYREL) 100 MG tablet Take 1 tablet (100 mg total) by mouth at bedtime.  30 tablet  3   Scheduled:  . metronidazole  500 mg Intravenous Q8H  . piperacillin-tazobactam (ZOSYN)  IV  3.375 g Intravenous Once  . sodium chloride  3 mL Intravenous Q12H   Infusions:  . sodium chloride 125 mL/hr at 02/15/13 0445  . pantoprozole (PROTONIX) infusion      Assessment: 79yo female c/o pain in back and buttocks d/t pressure ulcers, had BKA in Oct as well as DVT, found to be hypotensive to 80s, Hgb 9, and passing melenic stool w/ supratherapeutic INR, being reversed and awaiting EGD, also with wound dehiscence, to begin IV ABX for possible wound infection at multiple sites; of note also with elevated SCr (1.87, was 1 a week ago).  Goal of Therapy:  Vancomycin trough level 15-20 mcg/ml  Plan:  Rec'd vanc 1g and Zosyn 3.375g IV in ED; will continue with vancomycin 750mg  IV Q24H and Zosyn 3.375g IV Q8H and monitor CBC, Cx, SCr, levels prn.  Vernard Gambles, PharmD, BCPS  02/15/2013,5:28 AM

## 2013-02-15 NOTE — ED Notes (Signed)
Stage II Pressure Sore noted to Sacrum. Sacral Dressing applied.

## 2013-02-15 NOTE — H&P (Signed)
[facility] History and Physical  Patient: Angelica Lawrence  MVH:[id number]  DOB: 1926-04-10  DOS: the patient was seen and examined on 02/15/2013 PCP: [name], MD  Chief Complaint: Back pain and stump pain  HPI: Angelica Lawrence is a 79 y.o. female with Past medical history of coronary artery disease, hypertension, peripheral vascular disease, reduced to 180s status post bilateral amputation, diabetic, recent history of DVT after leg amputation, chronic kidney disease, chronic anemia. The patient is coming from home. The patient presents with complaints of bilateral stump pain. The history has been obtained from patient as well as patient's daughter. The patient herself does not complain of any shortness of breath, chest pain, nausea, vomiting, abdominal pain, diarrhea, burning urination. Her only complaint is bilateral stump pain which has been ongoing ever since the surgery. She was on pain medications at home but she ran out of the medications and since she could not go to the PCPs office she did not have a BP of the pain medication and that's why she was in a lot of pain. I discussed the case with the daughter and she also complains about the same. The daughter mentioned the patient had black color bowel movement even before her prior discharge but her hemoglobin remained consistently within the same range during the whole hospitalization. Also she did not mention any vomiting or fever.  Review of Systems: as mentioned in the history of present illness.  A Comprehensive review of the other systems is negative.  Past Medical History  Diagnosis Date  . ALLERGIC RHINITIS 12/30/2006  . ANEMIA, IRON DEFICIENCY 10/21/2009  . COLONIC POLYPS, HX OF 12/30/2006  . CORONARY ARTERY DISEASE 12/30/2006  . DIVERTICULOSIS, COLON 12/30/2006  . HYPERLIPIDEMIA 12/30/2006  . HYPERTENSION 12/30/2006  . PERIPHERAL NEUROPATHY 12/30/2006  . PERIPHERAL VASCULAR DISEASE 12/30/2006  . GOITER 12/30/2006  .  VITAMIN B12 DEFICIENCY 09/16/2008  . Carpal tunnel syndrome 12/30/2006  . HEMORRHOIDS 12/30/2006  . DEGENERATIVE JOINT DISEASE 02/10/2007  . INSOMNIA 10/21/2009  . CORNEAL TRANSPLANT 12/30/2006    family denies this hx on 12/10/2012  . DVT (deep venous thrombosis) 10/2012    "left leg" (12/10/2012)  . Type II diabetes mellitus   . Stroke 1970's?    denies residual on 12/10/2012   Past Surgical History  Procedure Laterality Date  . Appendectomy    . Cholecystectomy    . Abdominal hysterectomy  1960's    and bso  . Below knee leg amputation Left 1989  . Coronary artery bypass graft  2003    CABG X3" (12/10/2012)  . Cardiac catheterization      "once" (12/10/2012)  . Cataract extraction, bilateral Bilateral   . Below knee leg amputation Right 12/10/2012  . Amputation Right 12/10/2012    Procedure: AMPUTATION BELOW RIGHT KNEE;  Surgeon: Nadara Mustard, MD;  Location: [facility] OR;  Service: Orthopedics;  Laterality: Right;   Social History:  reports that she has never smoked. She has never used smokeless tobacco. She reports that she does not drink alcohol or use illicit drugs. Independent for most of her  ADL.  No Known Allergies  Family History  Problem Relation Age of Onset  . Cancer Neg Hx   . Heart disease Neg Hx     Prior to Admission medications   Medication Sig Start Date End Date Taking? Authorizing Provider  atenolol (TENORMIN) 12.5 mg TABS tablet Take 0.5 tablets (12.5 mg total) by mouth daily. 02/06/13   Joseph Art, DO  bisacodyl (DULCOLAX) 10 MG suppository Place 1 suppository (10 mg total) rectally daily as needed for mild constipation or moderate constipation. 02/06/13   Joseph Art, DO  collagenase (SANTYL) ointment Apply topically daily. 02/06/13   Joseph Art, DO  ferrous sulfate ([facility] IRON) 325 (65 FE) MG tablet Take 325 mg by mouth daily.      Historical Provider, MD  HYDROcodone-acetaminophen (NORCO/VICODIN) 5-325 MG per tablet Take 1 tablet by mouth every 4 (four)  hours as needed for pain.    Historical Provider, MD  megestrol (MEGACE) 400 MG/10ML suspension Take 10 mLs (400 mg total) by mouth daily. 02/06/13   Joseph Art, DO  Multiple Vitamin (MULTIVITAMIN WITH MINERALS) TABS tablet Take 1 tablet by mouth daily.     Historical Provider, MD  omeprazole (PRILOSEC) 20 MG capsule Take 1 capsule (20 mg total) by mouth daily. 08/18/12   [name], MD  polyethylene glycol (MIRALAX / Ethelene Hal) packet Take 17 g by mouth daily as needed for mild constipation. 02/06/13   Joseph Art, DO  simvastatin (ZOCOR) 40 MG tablet Take 1 tablet (40 mg total) by mouth at bedtime. 08/18/12   [name], MD  traZODone (DESYREL) 100 MG tablet Take 1 tablet (100 mg total) by mouth at bedtime. 04/17/12   [name], MD    Physical Exam: Filed Vitals:   02/15/13 0155 02/15/13 0215 02/15/13 0220 02/15/13 0245  BP:  98/35 98/43 97/72   Pulse:  76  76  Temp: 98.6 F (37 C)     TempSrc: Rectal     Resp:   14   SpO2:  100% 100% 98%    General: Alert, Awake and Oriented to Time, Place and Person. Appear in moderate distress Eyes: PERRL ENT: Oral Mucosa clear pale dry. Neck: No JVD Cardiovascular: S1 and S2 Present, no Murmur, Peripheral Pulses Present Respiratory: Bilateral Air entry equal and Decreased, Clear to Auscultation,  No Crackles, no wheezes Abdomen: Bowel Sound Present, Soft and Non tender Skin: No Rash Extremities: Bilateral below knee amputation, right leg wrapped, wound had some  purulent discharge Neurologic: Lethargic but oriented to time place and person.  Labs on Admission:  CBC:  Recent Labs Lab 02/15/13 0045  WBC 11.4*  HGB 9.0*  HCT 24.9*  MCV 82.2  PLT 317    CMP     Component Value Date/Time   NA 132* 02/15/2013 0045   K 4.0 02/15/2013 0045   CL 103 02/15/2013 0045   CO2 17* 02/15/2013 0045   GLUCOSE 88 02/15/2013 0045   GLUCOSE 257* 03/19/2006 0902   BUN 32* 02/15/2013 0045   CREATININE 1.87* 02/15/2013 0045   CREATININE 1.69*  12/14/2011 0935   CALCIUM 7.9* 02/15/2013 0045   PROT 6.2 02/15/2013 0045   ALBUMIN 1.7* 02/15/2013 0045   AST 46* 02/15/2013 0045   ALT 21 02/15/2013 0045   ALKPHOS 125* 02/15/2013 0045   BILITOT 1.7* 02/15/2013 0045   GFRNONAA 24* 02/15/2013 0045   GFRAA 27* 02/15/2013 0045    No results found for this basename: LIPASE, AMYLASE,  in the last 168 hours No results found for this basename: AMMONIA,  in the last 168 hours  No results found for this basename: CKTOTAL, CKMB, CKMBINDEX, TROPONINI,  in the last 168 hours BNP (last 3 results)  Recent Labs  10/16/12 2045 12/16/12 2006  PROBNP 1528.0* 18490.0*    Radiological Exams on Admission: Dg Chest Portable 1 View  02/15/2013   CLINICAL DATA:  Back pain.  EXAM: PORTABLE CHEST - 1 VIEW  COMPARISON:  01/30/2013  FINDINGS: Increased densities at the right lung base are suggestive for pleural fluid and possibly airspace disease. Prominent interstitial markings in the lower chest could represent dependent edema. Heart size is stable. Median sternotomy wires are present. Chronic changes in left shoulder.  IMPRESSION: Increased densities at the right lung base are concerning for pleural fluid and airspace disease/consolidation.  Prominent interstitial markings in lower chest could represent dependent edema.   Electronically Signed   By: Richarda Overlie M.D.   On: 02/15/2013 03:19     Assessment/Plan Principal Problem:   GI bleed Active Problems:   PERIPHERAL NEUROPATHY   HYPERTENSION   PERIPHERAL VASCULAR DISEASE   DM (diabetes mellitus)   Acute on chronic renal failure   DVT (deep venous thrombosis)   1. GI bleed  the patient is presenting with black color bowel movements which is heme positive and mild anemia she has elevated INR but she also had elevated INR during her last hospitalization at which time she did not have any GI bleed. Currently she is receiving 5 IV vitamin K. She was also get 2 units of FFP. We'll check her INR after that to  see if it is reversed as well as hemoglobin. If hemoglobin goes below 9 she should get blood transfusion as well as she is hypotensive. She'll be kept n.p.o. I discussed the case with the patient prefers to be resuscitated but does not want to be intubated for the procedure may require to revoke this. He should and daughter both were agreeable. Patient is also given IV Protonix bolus and will be placed on Protonix drip her last upper GI endoscopy did not show any findings back in 2002.  and discussed the case with GI attending on call, she recommended that the INR should be less than 2 for procedure.  2.History of DVT Patient presents with GI bleeding, has the DVT in October 2014. I discussed with the family about the risks of reversing the INR and recent DVT, possibility of further DVT as well as PE which could be life-threatening. The family is agreeable at present for the current management. Patient may require IVC filter.  3.Acute on chronic renal failure  Patient has been hydrated and will receive FFP as well, I would also give her IV albumin to improve oncotic pressure which eventually to renal perfusion. Monitor her BMP  Avoid nephrotoxic medications   4. DIABETES mellitus placing the patient on sliding scale  6. Peripheral vascular disease and osteomyelitis with recent amputation of the right leg with wound dehiscence and currently put her on discharge I will continue her on vancomycin and Zosyn empirically. Since she has been recently in the hospital I would also cover her for C. difficile due to her bloody bowel movement with IV Flagyl   Consults:  GI  DVT Prophylaxis: mechanical compression device Nutrition:  N.p.o.  Code Status:  partial   Family Communication:  daughter  was  called on her phone, opportunity was given to ask question and all questions were answered satisfactorily at the time of interview. Disposition: Admitted to inpatient in step-down  unit.  Author: Lynden Oxford, MD [facility] Pager: [phone] 02/15/2013, 4:08 AM    If 7PM-7AM, please contact night-coverage [email or web address] Password [email or web address]

## 2013-02-15 NOTE — ED Notes (Signed)
Purulent drainage noted to R stump incision site. Foul odor noted.

## 2013-02-15 NOTE — Op Note (Signed)
[name] [address] [place], [address]   ENDOSCOPY PROCEDURE REPORT  PATIENT: Angelica, Lawrence  MR#: [id number] BIRTHDATE: 10-Nov-1926 , 86  yrs. old GENDER: Female ENDOSCOPIST: Hart Carwin, MD REFERRED BY: Dr P.Patel PROCEDURE DATE:  02/15/2013 PROCEDURE:  EGD, diagnostic ASA CLASS:     Class III INDICATIONS:  anemia.  Hemoglobin dropped from 9to-7 g. Hemoccult-positive stool and hypotension.  Supratherapeutic prothrombin time of 5.6 while on Coumadin reversed with fresh frozen plasma to 1.5. MEDICATIONS: Fentanyl 12.5 mcg IV and Versed 1mg  IV TOPICAL ANESTHETIC: Cetacaine Spray  DESCRIPTION OF PROCEDURE: After the risks benefits and alternatives of the procedure were thoroughly explained, informed consent was obtained.  The Pentax Gastroscope [id number] endoscope was introduced through the mouth and advanced to the second portion of the duodenum. Without limitations.  The instrument was slowly withdrawn as the mucosa was fully examined.      Esophagus: esophageal mucosa appeared normal the proximal mid and distal esophagus. Squam ocolumnar junction was normal. There  was small reducible hiatal hernia measuring 1-2 cm Stomach: The gastric mucosa was erythematous in the gastric antrum there,  there was no blood in the stomach, no erosions or stigmata of recent bleeding. Retroflexion of the endoscope revealed normal fundus and cardia. Gastric outlet was normal Duodenum: Duodenal bulb and descending duodenum was normal[ The scope was then withdrawn from the patient and the procedure completed.  COMPLICATIONS: There were no complications. ENDOSCOPIC IMPRESSION: nothing to account for her hypotension and heme positive stool Antral gastritis  RECOMMENDATIONS: Continue PPIs Advance diet to clear liquids Follow H&H Currently the patient is not a candidate for colonoscopy,but will follow , REPEAT EXAM: no  eSigned:  Hart Carwin, MD  02/15/2013 4:53 PM   CC:

## 2013-02-15 NOTE — Progress Notes (Signed)
*  PRELIMINARY RESULTS* Vascular Ultrasound Lower extremity venous duplex has been completed.  Preliminary findings: Technically difficult study. No obvious evidence of DVT in visualized veins.    Farrel Demark, RDMS, RVT  02/15/2013, 10:57 AM

## 2013-02-15 NOTE — Progress Notes (Signed)
Pt transferred from the ED around 0530, admitted to Rm/3S14. Pt comes from home with daughter. She is alert and oriented. Bilateral BKA, Right stump has to open areas medially(5x2x0.1) and laterally(7.5x3x0.1) with drainage, bleeding and odor. She also has a healing Stage 2 to sacrum, foam dressing in place. Placed on telemetry, NSR. Oriented to room, instructed to call for assistance before getting out of bed. Resting comfortably at this time, will continue to monitor  Angelica Lawrence  0630 First unit of FFP started. Pt tolerating well, no s/s of distress or reaction. Pt instructed to call with any changes.

## 2013-02-15 NOTE — Progress Notes (Signed)
Update on pt's condition: 2 units of FFP are in, INR is down to 1.58. BP down to 85/sys, transferred to MICU , PICC line will be placed. Hgb down to 7.1, just had a large melanotic stool. Will proceed with EGD in ICU.r/o active bleeding.

## 2013-02-16 ENCOUNTER — Encounter (HOSPITAL_COMMUNITY): Payer: Self-pay | Admitting: Internal Medicine

## 2013-02-16 DIAGNOSIS — R195 Other fecal abnormalities: Secondary | ICD-10-CM

## 2013-02-16 DIAGNOSIS — K2970 Gastritis, unspecified, without bleeding: Secondary | ICD-10-CM

## 2013-02-16 DIAGNOSIS — K2990 Gastroduodenitis, unspecified, without bleeding: Secondary | ICD-10-CM

## 2013-02-16 LAB — COMPREHENSIVE METABOLIC PANEL
AST: 92 U/L — ABNORMAL HIGH (ref 0–37)
Albumin: 1.9 g/dL — ABNORMAL LOW (ref 3.5–5.2)
Alkaline Phosphatase: 97 U/L (ref 39–117)
BUN: 25 mg/dL — ABNORMAL HIGH (ref 6–23)
CO2: 17 mEq/L — ABNORMAL LOW (ref 19–32)
Calcium: 7.3 mg/dL — ABNORMAL LOW (ref 8.4–10.5)
Chloride: 111 mEq/L (ref 96–112)
Creatinine, Ser: 1.58 mg/dL — ABNORMAL HIGH (ref 0.50–1.10)
GFR calc non Af Amer: 29 mL/min — ABNORMAL LOW (ref >90)
Total Bilirubin: 2.5 mg/dL — ABNORMAL HIGH (ref 0.3–1.2)

## 2013-02-16 LAB — TYPE AND SCREEN
Antibody Screen: NEGATIVE
Donor AG Type: NEGATIVE
PT AG Type: NEGATIVE
Unit division: 0

## 2013-02-16 LAB — CBC
HCT: 24.9 % — ABNORMAL LOW (ref 36.0–46.0)
Hemoglobin: 8.8 g/dL — ABNORMAL LOW (ref 12.0–15.0)
Hemoglobin: 8.9 g/dL — ABNORMAL LOW (ref 12.0–15.0)
MCH: 29.9 pg (ref 26.0–34.0)
MCHC: 35.6 g/dL (ref 30.0–36.0)
MCV: 83.6 fL (ref 78.0–100.0)
Platelets: 242 10*3/uL (ref 150–400)
RBC: 2.99 MIL/uL — ABNORMAL LOW (ref 3.87–5.11)
RBC: 2.98 MIL/uL — ABNORMAL LOW (ref 3.87–5.11)
RDW: 16.6 % — ABNORMAL HIGH (ref 11.5–15.5)
WBC: 10 10*3/uL (ref 4.0–10.5)
WBC: 10.1 10*3/uL (ref 4.0–10.5)

## 2013-02-16 LAB — PREPARE FRESH FROZEN PLASMA
Unit division: 0
Unit division: 0
Unit division: 0
Unit division: 0

## 2013-02-16 MED ORDER — POTASSIUM CHLORIDE 10 MEQ/100ML IV SOLN
10.0000 meq | INTRAVENOUS | Status: AC
  Administered 2013-02-16 (×4): 10 meq via INTRAVENOUS
  Filled 2013-02-16 (×2): qty 100

## 2013-02-16 MED ORDER — PANTOPRAZOLE SODIUM 40 MG IV SOLR
40.0000 mg | INTRAVENOUS | Status: DC
  Administered 2013-02-16 – 2013-02-17 (×2): 40 mg via INTRAVENOUS
  Filled 2013-02-16 (×3): qty 40

## 2013-02-16 NOTE — Progress Notes (Signed)
PULMONARY  / CRITICAL CARE MEDICINE  Name: Angelica Lawrence MRN: [id number] DOB: [birth year]    ADMISSION DATE:  02/15/2013 CONSULTATION DATE:  12/7  REFERRING MD :  [facility]  PRIMARY SERVICE: [facility]>>>PCCM   CHIEF COMPLAINT:  Persistent hypotension/shock   BRIEF PATIENT DESCRIPTION:  79 year old female w/ multiple co-morbids PVD, RLE osteo,  DM, CKD, bilateral BKA, Most recently the right which was complicated by wound dehiscence being treated w/ santyl dressing changes. Just d/c'd from hospital on 11/28 after being admitted for dehydration and acute on chronic renal failure. Admitted to [facility] on 12/7 w/ CC; bilateral leg pain and dark stools. 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 PCCM asked to see later in the day on 12/7 for persistent hypotension in spite of fluid resuscitation efforts.   SIGNIFICANT EVENTS / STUDIES:    LINES / TUBES:   CULTURES: UC 12/7>>> BCX2 12/7>>> R stump culture 12/7>>>  ANTIBIOTICS: vanc 12/6>>> Zosyn 12/6>>> Flagyl 12/6>>>     SUBJECTIVE:  C/O pain all over but denies abd pain specifically. RASS 0 to +1. + F/C  VITAL SIGNS: Temp:  [97.2 F (36.2 C)-99.2 F (37.3 C)] 97.7 F (36.5 C) (12/08 1113) Pulse Rate:  [36-135] 115 (12/08 1400) Resp:  [13-40] 29 (12/08 1400) BP: (73-123)/(26-85) 121/76 mmHg (12/08 1400) SpO2:  [82 %-100 %] 96 % (12/08 1400) HEMODYNAMICS: CVP:  [7 mmHg-10 mmHg] 7 mmHg VENTILATOR SETTINGS:   INTAKE / OUTPUT: Intake/Output     12/07 0701 - 12/08 0700 12/08 0701 - 12/09 0700   I.V. (mL/kg) 2687.5 (38.8) 425 (6.1)   Blood 1350.7    IV Piggyback 1625 375   Total Intake(mL/kg) 5663.2 (81.7) 800 (11.5)   Urine (mL/kg/hr) 935 (0.6) 50 (0.1)   Total Output 935 50   Net +4728.2 +750        Stool Occurrence 2 x      PHYSICAL EXAMINATION: General:  Chronically ill appearing AAF, no acute distress  Neuro:  Alert, oriented. Generalized weakness  HEENT:  Poor dentition no JVD   Cardiovascular:  rrr Lungs:  Dec on the right  Abdomen: NT,  + bowel sounds  Ext:  RLE amp w/ necrotic incision site and purulent foul smelling d/c  LABS: I have reviewed all of today's lab results. Relevant abnormalities are discussed in the A/P section     CXR:  NNF  ASSESSMENT / PLAN:  PULMONARY A:  No acute issues  P:   Supplemental O2 as needed  CARDIOVASCULAR A:  Shock, resolved  P:  Transfer to SDU TRH to resume care AM 12/09  RENAL A:   Acute on chronic renal failure  Baseline CKD stage 3A,  w/ BL scr 1.0 Metabolic acidosis  P:   Monitor BMET intermittently Correct electrolytes as indicated  GASTROINTESTINAL A:   Acute GIB - negative EGD 12/07 P:   Cont PPI Advance diet as tolerated  HEMATOLOGIC A:   Acute blood loss anemia  Coumadin induced coagulopathy Recent LLE DVT  P:  Transfuse for hemorrhagic shock or for Hgb < 7.0 Repeat LE Dopplers ordered - if positive, consider IVC filter NOT A CANDIDATE FOR FURTHER ANTICOAGULATION  INFECTIOUS A:   RLE stump infection: P:   Micro and abx as above WOC to eval and follow  ENDOCRINE A:   No acute issue   P:   Trend glucose  Ck cortisol   NEUROLOGIC A:   Pain Chronic debilitation P:  PRN fentanyl  Have asked Palliative Care to eval for goals of care  TODAY'S SUMMARY:   I have personally obtained a history, examined the patient, evaluated laboratory and imaging results, formulated the assessment and plan and placed orders. CRITICAL CARE: The patient is critically ill with multiple organ systems failure and requires high complexity decision making for assessment and support, frequent evaluation and titration of therapies, application of advanced monitoring technologies and extensive interpretation of multiple databases. Critical Care Time devoted to patient care services described in this note is --- minutes.    Billy Fischer, MD ; [facility] [phone].  After 5:30 PM  or weekends, call [phone]

## 2013-02-16 NOTE — Procedures (Signed)
Central Venous Catheter Insertion Procedure Note KAEDEN DEPAZ [id number] 1926-03-16  Procedure: Insertion of Central Venous Catheter Indications: Assessment of intravascular volume, Drug and/or fluid administration and Frequent blood sampling  Procedure Details Consent: Risks of procedure as well as the alternatives and risks of each were explained to the (patient/caregiver).  Consent for procedure obtained. Time Out: Verified patient identification, verified procedure, site/side was marked, verified correct patient position, special equipment/implants available, medications/allergies/relevent history reviewed, required imaging and test results available.  Performed  Maximum sterile technique was used including antiseptics, cap, gloves, gown, hand hygiene, mask and sheet. Skin prep: Chlorhexidine; local anesthetic administered A antimicrobial bonded/coated triple lumen catheter was placed in the left internal jugular vein using the Seldinger technique.  Evaluation Blood flow good Complications: No apparent complications Patient did tolerate procedure well. Chest X-ray ordered to verify placement.  CXR: pending.    I was present for and supervised the entire procedure  Billy Fischer, MD ; [facility] service Mobile [phone].  After 5:30 PM or weekends, call [phone]

## 2013-02-16 NOTE — Progress Notes (Signed)
eLink Physician-Brief Progress Note Patient Name: Angelica Lawrence DOB: [birth year] MRN: [id number]  Date of Service  02/16/2013   HPI/Events of Note     eICU Interventions  Hypokalemia, repleted    Intervention Category Minor Interventions: Electrolytes abnormality - evaluation and management  [name] 02/16/2013, 6:25 AM

## 2013-02-16 NOTE — Progress Notes (Signed)
Daily Rounding Note  02/16/2013, 8:26 AM  LOS: 1 day   SUBJECTIVE:       Latest stools are greenish colored, no blood or melena.  Pt complains of feeling cold, denies pain but complains when staff touch her just about anywhere  OBJECTIVE:         Vital signs in last 24 hours:    Temp:  [97.2 F (36.2 C)-98.7 F (37.1 C)] 98.1 F (36.7 C) (12/08 0400) Pulse Rate:  [41-135] 101 (12/08 0700) Resp:  [11-39] 22 (12/08 0700) BP: (73-123)/(26-85) 98/26 mmHg (12/08 0700) SpO2:  [82 %-100 %] 100 % (12/08 0700) Weight:  [69.31 kg (152 lb 12.8 oz)] 69.31 kg (152 lb 12.8 oz) (12/07 1430) Last BM Date: 02/15/13 General: minimally responsive, looks ill.    Heart: RRR.  No MRG Chest: clear bil in front.  Breathing unlabored, no cough Abdomen: soft, NT, ND.  No HSM, no mass.   Extremities: amputations at both LE.  Right stump bandaged. Neuro/Psych:  Follows simple commands.  Speech garbled but responds appropriately.  Some moaning.   Intake/Output from previous day: 12/07 0701 - 12/08 0700 In: 1478.2 [I.V.:2687.5; Blood:1350.7; IV Piggyback:1625] Out: 935 [Urine:935]  Intake/Output this shift: Total I/O In: 212.5 [I.V.:100; IV Piggyback:112.5] Out: -   Lab Results:  Recent Labs  02/15/13 2310 02/16/13 0130 02/16/13 0440  WBC 10.8* 10.0 10.1  HGB 9.2* 8.8* 8.9*  HCT 26.0* 24.7* 24.9*  PLT 261 226 242   BMET  Recent Labs  02/15/13 0045 02/16/13 0440  NA 132* 138  K 4.0 3.2*  CL 103 111  CO2 17* 17*  GLUCOSE 88 102*  BUN 32* 25*  CREATININE 1.87* 1.58*  CALCIUM 7.9* 7.3*   LFT  Recent Labs  02/15/13 0045 02/16/13 0440  PROT 6.2 5.5*  ALBUMIN 1.7* 1.9*  AST 46* 92*  ALT 21 31  ALKPHOS 125* 97  BILITOT 1.7* 2.5*   PT/INR  Recent Labs  02/15/13 0136 02/15/13 1318  LABPROT 50.1* 20.7*  INR 5.86* 1.84*    Studies/Results: Dg Chest Port 1 View 02/15/2013  FINDINGS: Interval left jugular  catheter with its tip in the right atrium. No pneumothorax. Borderline enlarged cardiac silhouette with a mild increase in size. Increased prominence of the pulmonary vasculature and interstitial markings. Small right pleural effusion. Stable post CABG changes and thoracic spine degenerative changes. Right upper quadrant abdominal surgical clips. Old comminuted fracture of the left humeral head and neck and right shoulder degenerative changes.  IMPRESSION: 1. Left jugular catheter tip in the right atrium. Retracting the catheter 3 cm would place it at the cavoatrial junction. 2. Interval borderline cardiomegaly and progressive changes of congestive heart failure.   Electronically Signed   By: Gordan Payment M.D.   On: 02/15/2013 17:00   Dg Chest Portable 1 View 02/15/2013   CLINICAL DATA:  Back pain.  EXAM: PORTABLE CHEST - 1 VIEW  COMPARISON:  01/30/2013  FINDINGS: Increased densities at the right lung base are suggestive for pleural fluid and possibly airspace disease. Prominent interstitial markings in the lower chest could represent dependent edema. Heart size is stable. Median sternotomy wires are present. Chronic changes in left shoulder.  IMPRESSION: Increased densities at the right lung base are concerning for pleural fluid and airspace disease/consolidation.  Prominent interstitial markings in lower chest could represent dependent edema.   Electronically Signed   By: Richarda Overlie M.D.   On: 02/15/2013 03:19  ASSESMENT:   *  Heme +, dark stool in setting of INR of 5.8.  Taking PO Iron PTA.  EGD 02/15/13: antral gastritis.   *  ABL with underlying normocytic anemia of chronic disease.  Hgb of 9.9 in 12/2012, 10.6 late 01/2013.  S/p PRBC x one.   *  Supratherapeutic INR.  Corrected with FFP x 4 units, vitamin K.  Hx DVT 12/10/12.  *  Bump in total bili and AST.  ? Element of hypoperfusion.   *  Shock.  ? Septic.   *  Fecal impaction. Improved.  *  CHF.   *  12/10/12 right BKA (for ankle wound  and osteo, peripheral vascular disease), incision site dehiscence and MRSA infection.  Previous left AKA.  On Vanc, Zosyn   *  Remote hx colon polyps. Not currently candidate for colonoscopy.   *  Chronic kidney disease. Stage 3  *  Protein malnutrition.  Low Albumin.    PLAN   *  Stop Protonix drip. Begin once daily PPI, IV for now, po when MS improves and reliably taking po. .  *  Consider goals of care/palliative consult.     Jennye Moccasin  02/16/2013, 8:26 AM Pager: [phone]  GI ATTENDING  Patient seen ans examined. Interval history and labs reviewed. Case discussed with GI colleagues in morning report. Agree with above. No significant GI bleeding or GI mucosal pathology on EGD. Heme + stool in face of supra-therapeutic INR. Problems with low BP due to something other than GI blood loss. Agree with PPI therapy and keeping INR therapeutic. No further GI workup planned. Last colonoscopy 2008 with minimal finding. Will sign off. Thanks   Wilhemina Bonito. Eda Keys., M.D. [facility] Division of Gastroenterology

## 2013-02-17 DIAGNOSIS — E43 Unspecified severe protein-calorie malnutrition: Secondary | ICD-10-CM

## 2013-02-17 DIAGNOSIS — Z515 Encounter for palliative care: Secondary | ICD-10-CM

## 2013-02-17 LAB — CBC
HCT: 25.3 % — ABNORMAL LOW (ref 36.0–46.0)
MCH: 29.4 pg (ref 26.0–34.0)
MCHC: 35.6 g/dL (ref 30.0–36.0)
Platelets: 238 10*3/uL (ref 150–400)
RBC: 3.06 MIL/uL — ABNORMAL LOW (ref 3.87–5.11)
RDW: 17.4 % — ABNORMAL HIGH (ref 11.5–15.5)

## 2013-02-17 LAB — BASIC METABOLIC PANEL
CO2: 17 mEq/L — ABNORMAL LOW (ref 19–32)
Calcium: 7.4 mg/dL — ABNORMAL LOW (ref 8.4–10.5)
Creatinine, Ser: 1.49 mg/dL — ABNORMAL HIGH (ref 0.50–1.10)
GFR calc non Af Amer: 31 mL/min — ABNORMAL LOW (ref >90)
Glucose, Bld: 138 mg/dL — ABNORMAL HIGH (ref 70–99)
Sodium: 138 mEq/L (ref 135–145)

## 2013-02-17 MED ORDER — ENSURE COMPLETE PO LIQD
237.0000 mL | Freq: Three times a day (TID) | ORAL | Status: DC
  Administered 2013-02-17 – 2013-02-19 (×6): 237 mL via ORAL

## 2013-02-17 MED ORDER — FENTANYL 50 MCG/HR TD PT72
50.0000 ug | MEDICATED_PATCH | TRANSDERMAL | Status: DC
  Administered 2013-02-17: 50 ug via TRANSDERMAL
  Filled 2013-02-17: qty 1

## 2013-02-17 MED ORDER — MORPHINE SULFATE (CONCENTRATE) 10 MG /0.5 ML PO SOLN
20.0000 mg | ORAL | Status: DC | PRN
  Administered 2013-02-17 – 2013-02-18 (×2): 20 mg via SUBLINGUAL
  Filled 2013-02-17 (×2): qty 1

## 2013-02-17 MED ORDER — FENTANYL CITRATE 0.05 MG/ML IJ SOLN
50.0000 ug | INTRAMUSCULAR | Status: DC | PRN
  Administered 2013-02-17 – 2013-02-18 (×2): 50 ug via INTRAVENOUS
  Filled 2013-02-17 (×2): qty 2

## 2013-02-17 MED ORDER — ATROPINE SULFATE 1 % OP SOLN
2.0000 [drp] | OPHTHALMIC | Status: DC | PRN
  Filled 2013-02-17: qty 2

## 2013-02-17 MED ORDER — BISACODYL 10 MG RE SUPP: 10.0000 mg | Freq: Every day | RECTAL | Status: DC | PRN

## 2013-02-17 MED ORDER — MEGESTROL ACETATE 400 MG/10ML PO SUSP
400.0000 mg | Freq: Every day | ORAL | Status: DC
  Administered 2013-02-17: 400 mg via ORAL
  Filled 2013-02-17: qty 10

## 2013-02-17 NOTE — Progress Notes (Signed)
INITIAL NUTRITION ASSESSMENT  DOCUMENTATION CODES Per approved criteria  -Obesity Unspecified   INTERVENTION:  Ensure Complete PO TID, each supplement provides 350 kcal and 13 grams of protein.  NUTRITION DIAGNOSIS: Increased nutrient needs related to stump infection as evidenced by estimated nutrition needs.   Goal: Intake to meet >90% of estimated nutrition needs.  Monitor:  PO intake, labs, weight trend.  Reason for Assessment: Low Braden  79 y.o. female  Admitting Dx: GI bleed  ASSESSMENT: 79 year old female w/ multiple co-morbids PVD, RLE osteo, DM, CKD, bilateral BKA, Most recently the right which was complicated by wound dehiscence being treated w/ santyl dressing changes. Just d/c'd from hospital on 11/28 after being admitted for dehydration and acute on chronic renal failure. Admitted to [facility] on 12/7 w/ CC; bilateral leg pain and dark stools. 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  Unable to complete nutrition focused physical exam due to RN reports that patient is in a lot of pain. Patient with increased nutrition needs to support wound healing.  Height: Ht Readings from Last 1 Encounters:  02/15/13 5\' 5"  (1.651 m)    Weight: Wt Readings from Last 1 Encounters:  02/17/13 160 lb 7.9 oz (72.8 kg)    Ideal Body Weight: 50.1 kg (adjusted for amputations)  % Ideal Body Weight: 145%  Wt Readings from Last 10 Encounters:  02/17/13 160 lb 7.9 oz (72.8 kg)  02/17/13 160 lb 7.9 oz (72.8 kg)  02/04/13 143 lb 7.3 oz (65.07 kg)  01/20/13 164 lb (74.39 kg)  01/06/13 160 lb (72.576 kg)  12/24/12 163 lb (73.936 kg)  12/17/12 178 lb (80.74 kg)  12/16/12 185 lb (83.915 kg)  12/15/12 162 lb (73.483 kg)  11/12/12 152 lb (68.947 kg)    Usual Body Weight: 164 lb   % Usual Body Weight: 98%  BMI:  30.2 (obesity, class 1)  Estimated Nutritional Needs: Kcal: 1700-1900 Protein: 100-115 gm Fluid: 1.7-1.9 L  Skin: stage  II sacral pressure ulcer; Right leg BKA stump with dehiscence   Diet Order: Full Liquid  EDUCATION NEEDS: -Education not appropriate at this time   Intake/Output Summary (Last 24 hours) at 02/17/13 1304 Last data filed at 02/17/13 1100  Gross per 24 hour  Intake   1420 ml  Output    340 ml  Net   1080 ml    Last BM: 12/9   Labs:   Recent Labs Lab 02/15/13 0045 02/16/13 0440 02/17/13 0425  NA 132* 138 138  K 4.0 3.2* 3.7  CL 103 111 112  CO2 17* 17* 17*  BUN 32* 25* 23  CREATININE 1.87* 1.58* 1.49*  CALCIUM 7.9* 7.3* 7.4*  GLUCOSE 88 102* 138*    CBG (last 3)   Recent Labs  02/15/13 1207 02/15/13 1430 02/15/13 1534  GLUCAP 95 76 69*    Scheduled Meds: . collagenase   Topical Daily  . pantoprazole (PROTONIX) IV  40 mg Intravenous Q24H  . piperacillin-tazobactam (ZOSYN)  IV  3.375 g Intravenous Q8H  . sodium chloride  3 mL Intravenous Q12H  . vancomycin  750 mg Intravenous Q24H    Continuous Infusions: . sodium chloride 20 mL/hr at 02/15/13 2000  . dextrose 5 % and 0.45% NaCl 50 mL/hr at 02/16/13 1235    Past Medical History  Diagnosis Date  . ALLERGIC RHINITIS 12/30/2006  . ANEMIA, IRON DEFICIENCY 10/21/2009  . COLONIC POLYPS, HX OF 12/30/2006  . CORONARY ARTERY DISEASE 12/30/2006  .  DIVERTICULOSIS, COLON 12/30/2006  . HYPERLIPIDEMIA 12/30/2006  . HYPERTENSION 12/30/2006  . PERIPHERAL NEUROPATHY 12/30/2006  . PERIPHERAL VASCULAR DISEASE 12/30/2006  . GOITER 12/30/2006  . VITAMIN B12 DEFICIENCY 09/16/2008  . Carpal tunnel syndrome 12/30/2006  . HEMORRHOIDS 12/30/2006  . DEGENERATIVE JOINT DISEASE 02/10/2007  . INSOMNIA 10/21/2009  . CORNEAL TRANSPLANT 12/30/2006    family denies this hx on 12/10/2012  . DVT (deep venous thrombosis) 10/2012    "left leg" (12/10/2012)  . Type II diabetes mellitus   . Stroke 1970's?    denies residual on 12/10/2012    Past Surgical History  Procedure Laterality Date  . Appendectomy    . Cholecystectomy    .  Abdominal hysterectomy  1960's    and bso  . Below knee leg amputation Left 1989  . Coronary artery bypass graft  2003    CABG X3" (12/10/2012)  . Cardiac catheterization      "once" (12/10/2012)  . Cataract extraction, bilateral Bilateral   . Below knee leg amputation Right 12/10/2012  . Amputation Right 12/10/2012    Procedure: AMPUTATION BELOW RIGHT KNEE;  Surgeon: Nadara Mustard, MD;  Location: [facility] OR;  Service: Orthopedics;  Laterality: Right;  . Esophagogastroduodenoscopy N/A 02/15/2013    Procedure: ESOPHAGOGASTRODUODENOSCOPY (EGD);  Surgeon: Hart Carwin, MD;  Location[facility] ENDOSCOPY;  Service: Endoscopy;  Laterality: N/A;    Joaquin Courts, RD, LDN, CNSC Pager [phone] After Hours Pager [phone]

## 2013-02-17 NOTE — Progress Notes (Addendum)
[facility] Progress Note [place] TEAM 1 - Stepdown/ICU TEAM   Angelica Lawrence ZOX:[id number] DOB: 03-21-1926 DOA: 02/15/2013 PCP: No PCP Per Patient  Brief narrative: This is an 79 year old female with Left BKA in the past, PVD, diabetes mellitus, chronic kidney disease, right lower extremity osteomyelitis s/p recent right BKA in Nov and  DVT of the left leg after the surgery for which she was on Coumadin. She was being treated for dehiscence of wound on right stump on an outpt basis.  She presented to the ER for pain in b/l stumps as she had ran out of her pain medications. In obtaining the history it was discovered that the patient had been having black BMs and, subsequently, was noted to be heme positive. Her INR was supratherapeutic at 8.57. It was reversed with Vit K and FFP and she was admitted for a GI work up.  She was also noted to have AKI and was hypotensive. Unfortunately, she became hypotensive again on 12/7 and required pressor support in the ICU. She underwent an EGD on 12/7 which was essentially unrevealing for a source of bleeding. Her "melena" resolved and her Hgb has been stable since.   Assessment/Plan:  GI bleed - source uncertain after EGD however, bleeding has abated- advance diet and follow stools - on PPI  Anemia of acute blood loss vs dilutional anemia - s/p transfusion of 1 U PRBC  - Hgb has been steady since above transfusion  H/o Iron deficiency - will resume Iron at d/c  Shock - hypovolemic vs septic and possibly both - resolved  Wound dehiscence/ infected wound - will need to consult Dr [name] - mod gram neg rods on culture from 12/9 - Vanc/ Zosyn  UTI - gr neg rods > 100,000 colonies - cont Zosyn and follow culture  ARF on CKD 3 - cr improving   LLE DVT - repeat duplex negative for DVT  DM - last A1c 6.6 in 10/17/12 - start accuchecks - hold off on insulin for now   H.o HTN - holding meds for now  Anorexia - resume Megace and follow  PO intake   Code Status: DNR Family Communication: none Disposition Plan: transfer to med/surg  Consultants: GI PCCM  Procedures: EGD  Antibiotics: Vanc 12/8   DVT prophylaxis: none  HPI/Subjective: Pt alert. She has no specific complaints.    Objective: Blood pressure 101/54, pulse 87, temperature 97.7 F (36.5 C), temperature source Oral, resp. rate 14, height 5\' 5"  (1.651 m), weight 72.8 kg (160 lb 7.9 oz), SpO2 100.00%.  Intake/Output Summary (Last 24 hours) at 02/17/13 1513 Last data filed at 02/17/13 1329  Gross per 24 hour  Intake   1565 ml  Output    300 ml  Net   1265 ml     Exam: General: elderly female laying in bed -No acute respiratory distress Lungs: Clear to auscultation bilaterally without wheezes or crackles- 100% on 2L Cardiovascular: Regular rate and rhythm without murmur gallop or rub normal S1 and S2 Abdomen: Nontender, nondistended, soft, bowel sounds positive, no rebound, no ascites, no appreciable mass Extremities: No significant cyanosis, clubbing, or edema bilateral lower extremities  Data Reviewed: Basic Metabolic Panel:  Recent Labs Lab 02/15/13 0045 02/16/13 0440 02/17/13 0425  NA 132* 138 138  K 4.0 3.2* 3.7  CL 103 111 112  CO2 17* 17* 17*  GLUCOSE 88 102* 138*  BUN 32* 25* 23  CREATININE 1.87* 1.58* 1.49*  CALCIUM 7.9* 7.3* 7.4*  Liver Function Tests:  Recent Labs Lab 02/15/13 0045 02/16/13 0440  AST 46* 92*  ALT 21 31  ALKPHOS 125* 97  BILITOT 1.7* 2.5*  PROT 6.2 5.5*  ALBUMIN 1.7* 1.9*   No results found for this basename: LIPASE, AMYLASE,  in the last 168 hours No results found for this basename: AMMONIA,  in the last 168 hours CBC:  Recent Labs Lab 02/15/13 1654 02/15/13 2310 02/16/13 0130 02/16/13 0440 02/17/13 0425  WBC 8.7 10.8* 10.0 10.1 9.9  HGB 7.1* 9.2* 8.8* 8.9* 9.0*  HCT 19.5* 26.0* 24.7* 24.9* 25.3*  MCV 83.0 83.9 82.6 83.6 82.7  PLT 257 261 226 242 238   Cardiac Enzymes: No  results found for this basename: CKTOTAL, CKMB, CKMBINDEX, TROPONINI,  in the last 168 hours BNP (last 3 results)  Recent Labs  10/16/12 2045 12/16/12 2006  PROBNP 1528.0* 18490.0*   CBG:  Recent Labs Lab 02/15/13 0759 02/15/13 1207 02/15/13 1430 02/15/13 1534  GLUCAP 98 95 76 69*    Recent Results (from the past 240 hour(s))  URINE CULTURE     Status: None   Collection Time    02/15/13  8:42 AM      Result Value Range Status   Specimen Description URINE, CATHETERIZED   Final   Special Requests NONE   Final   Culture  Setup Time     Final   Value: 02/15/2013 18:22     Performed at [facility] Count     Final   Value: >=100,000 COLONIES/ML     Performed at [facility]   Culture     Final   Value: GRAM NEGATIVE RODS     Performed at [facility]   Report Status PENDING   Incomplete  WOUND CULTURE     Status: None   Collection Time    02/15/13 10:49 PM      Result Value Range Status   Specimen Description WOUND RIGHT LEG   Final   Special Requests Normal   Final   Gram Stain     Final   Value: FEW WBC PRESENT,BOTH PMN AND MONONUCLEAR     NO SQUAMOUS EPITHELIAL CELLS SEEN     RARE GRAM POSITIVE COCCI IN PAIRS     Performed at [facility]   Culture     Final   Value: MODERATE GRAM NEGATIVE RODS     Performed at [facility]   Report Status PENDING   Incomplete  CULTURE, BLOOD (ROUTINE X 2)     Status: None   Collection Time    02/15/13 11:00 PM      Result Value Range Status   Specimen Description BLOOD RIGHT ARM   Final   Special Requests BOTTLES DRAWN AEROBIC ONLY 10CC   Final   Culture  Setup Time     Final   Value: 02/16/2013 08:54     Performed at [facility]   Culture     Final   Value:        BLOOD CULTURE RECEIVED NO GROWTH TO DATE CULTURE WILL BE HELD FOR 5 DAYS BEFORE ISSUING A FINAL NEGATIVE REPORT     Performed at [facility]   Report Status PENDING   Incomplete  CULTURE,  BLOOD (ROUTINE X 2)     Status: None   Collection Time    02/15/13 11:10 PM      Result Value Range Status   Specimen Description  BLOOD RIGHT ARM   Final   Special Requests BOTTLES DRAWN AEROBIC ONLY 4CC   Final   Culture  Setup Time     Final   Value: 02/16/2013 08:54     Performed at [facility]   Culture     Final   Value:        BLOOD CULTURE RECEIVED NO GROWTH TO DATE CULTURE WILL BE HELD FOR 5 DAYS BEFORE ISSUING A FINAL NEGATIVE REPORT     Performed at [facility]   Report Status PENDING   Incomplete     Studies:  Recent x-ray studies have been reviewed in detail by the Attending Physician  Scheduled Meds:  Scheduled Meds: . collagenase   Topical Daily  . feeding supplement (ENSURE COMPLETE)  237 mL Oral TID BM  . megestrol  400 mg Oral Daily  . pantoprazole (PROTONIX) IV  40 mg Intravenous Q24H  . piperacillin-tazobactam (ZOSYN)  IV  3.375 g Intravenous Q8H  . sodium chloride  3 mL Intravenous Q12H  . vancomycin  750 mg Intravenous Q24H   Continuous Infusions: . sodium chloride 20 mL/hr at 02/15/13 2000  . dextrose 5 % and 0.45% NaCl 50 mL/hr at 02/16/13 1235    Time spent on care of this patient:   Trygg Mantz, MD  [facility] Office  [phone] Pager - Text Page per Loretha Stapler as per below:  On-Call/Text Page:      [email or web address]      password [email or web address]  If 7PM-7AM, please contact night-coverage [email or web address] Password [email or web address] 02/17/2013, 3:13 PM   LOS: 2 days

## 2013-02-17 NOTE — Consult Note (Signed)
Palliative Medicine Team at [facility]  Date: 02/17/2013   Patient Name: Angelica Lawrence  DOB: April 30, 1926  MRN: [id number]  Age / Sex: 79 y.o., female   PCP: No Pcp Per Patient Referring Physician: Calvert Cantor, MD  HPI/Reason for Consultation: 79 yo with intractable pain and wound dehiscence following amputation of her right LE for PVD and osteomyelitis wound infection. She is in the ICU for sepsis and hypotension. Family asking for comfort care. PMT consulted for assistance.   Participants in Discussion: Patient's daughter HCPOA and grandaughter, husband unable to come in to hospital  Goals/Summary of Discussion:  1. Code Status:  DNR  2. Scope of Treatment:  Full comfort care  No life prolonging interventions, no uncomfortable procedures  Allow for sleep and comfort tonight  3. Assessment/Plan:  Primary Diagnoses  1. Septic Shock, post-amputation wound infection/dehiscence 2. Intractable pain from PVD, neuropathy and acute wound, phantom limb pain 3. DM2 4. CAD 5. Acute encephalopathy with baseline Vascular Dementia  Prognosis: hours-days   PPS 20   Active Symptoms 1. Severe refractory pain 2. Dyspnea 3. Immobility 4. Wound  Recs:  1. Start Fentanyl Patch 50 mcg, 25 mg q1 boluses until patch starts working 2. Sublingual morphine for break-though pain and dyspnea 3. Atropine for secretions 4. Dulcolax prn 6. Ativan prn for agitation 7. Home with hospice for EOL care   4. Palliative Prophylaxis:   Bowel Regimen- yes  Terminal Secretions-yes  Breakthrough Pain and Dyspnea-yes   Agitation and Delirium-yes  Nausea-yes  5. Psychosocial Spiritual Asssessment/Interventions:  Patient and Family Adjustment to Illness/Prognosis: Appropriate grief  Spiritual Concerns or Needs: none expressed  6. Disposition: Patient's daughter and grand-daughter want her to be at home for EOL-she would want to be with her husband of the last 70 years. Home with hospice  as soon as possible to be able to honor that wish-will continue IV fluids and abx until time of discharge and then discontinue.  Time: 70 minutes Greater than 50%  of this time was spent counseling and coordinating care related to the above assessment and plan.  Signed by: Edsel Petrin, DO  02/17/2013, 5:49 PM  Please contact Palliative Medicine Team phone at [phone] for questions and concerns.

## 2013-02-18 DIAGNOSIS — K922 Gastrointestinal hemorrhage, unspecified: Secondary | ICD-10-CM

## 2013-02-18 DIAGNOSIS — G609 Hereditary and idiopathic neuropathy, unspecified: Secondary | ICD-10-CM

## 2013-02-18 DIAGNOSIS — R579 Shock, unspecified: Secondary | ICD-10-CM

## 2013-02-18 DIAGNOSIS — I739 Peripheral vascular disease, unspecified: Secondary | ICD-10-CM

## 2013-02-18 DIAGNOSIS — S88119A Complete traumatic amputation at level between knee and ankle, unspecified lower leg, initial encounter: Secondary | ICD-10-CM

## 2013-02-18 LAB — BASIC METABOLIC PANEL
BUN: 21 mg/dL (ref 6–23)
Calcium: 7.5 mg/dL — ABNORMAL LOW (ref 8.4–10.5)
Chloride: 115 mEq/L — ABNORMAL HIGH (ref 96–112)
Creatinine, Ser: 1.44 mg/dL — ABNORMAL HIGH (ref 0.50–1.10)
GFR calc Af Amer: 37 mL/min — ABNORMAL LOW (ref >90)
GFR calc non Af Amer: 32 mL/min — ABNORMAL LOW (ref >90)

## 2013-02-18 LAB — CBC
HCT: 23.6 % — ABNORMAL LOW (ref 36.0–46.0)
Hemoglobin: 8.3 g/dL — ABNORMAL LOW (ref 12.0–15.0)
MCHC: 35.2 g/dL (ref 30.0–36.0)
Platelets: 198 10*3/uL (ref 150–400)
RDW: 18.1 % — ABNORMAL HIGH (ref 11.5–15.5)
WBC: 7.4 10*3/uL (ref 4.0–10.5)

## 2013-02-18 MED ORDER — WHITE PETROLATUM GEL
Status: AC
  Administered 2013-02-18: 11:00:00
  Filled 2013-02-18: qty 5

## 2013-02-18 MED ORDER — SODIUM CHLORIDE 0.9 % IJ SOLN
10.0000 mL | INTRAMUSCULAR | Status: DC | PRN
  Administered 2013-02-19: 10 mL

## 2013-02-18 MED ORDER — MORPHINE SULFATE (CONCENTRATE) 10 MG /0.5 ML PO SOLN
10.0000 mg | ORAL | Status: DC | PRN
  Administered 2013-02-19 (×2): 10 mg via SUBLINGUAL
  Filled 2013-02-18 (×2): qty 0.5

## 2013-02-18 NOTE — Progress Notes (Addendum)
Notified by Pieter Partridge, patient and family request services of [facility]osp[facility] and [facility] of  [facility]) after discharge.  Patient information reviewed with Dr Elliot Gurney, [facility] Medical Director hospice eligible with dx: PVD.  Patient seen at bedside awakes easily to voice -granddaughter, Marylene Land in room, patient alert, talkative. Marylene Land reported she fed patient breakfast and she took approx 50%.   Writer spoke via phone with daughter Victorino Dike [phone] to initiate education related to hospice services, philosophy and team approach to care; Victorino Dike voiced good understanding of information provided. Per discussion plan is to d/c by non-emergent transport possibly tomorrow, Thursday 12/11 *Please send completed GOLD DNR form in shadow chart home with patient *Per discussion with daughter patient was in the process of changing PCP/ has not seen newly assigned PCP since September -daughter stated she has considered a 'home-visit doctor' but hasn't been able to call- information given to family on [facility] practice, daughter is agreeable to having Marletta Lor NP assume care after discharge; patient information will be faxed to Marletta Lor NP office and she will arrange to visit home next Wednesday  * Patient stated she is comfortable on this visit, has some RUE tremors noted, and patient rubbing R thigh and brow furrowed, spoke with staff RN Rachel,last pain med given IV Fentanyl 50 mcg at 12:15;  encouraged staff to use ordered PO liquid concentrated Morphine as evening goes on as patient to continue on PO/SL Liquid concentrated Morphine at d/c -Dr Ladona Ridgel PMT also following and available to staff for symptom management questions. *Please note discharge RX should indicate 'dispense quantity= 30ml' as this is the only quantity carried by community pharmacy    DME needs discussed -patient has electric bed and hoyer lift currently in the home from [facility]; the following equipment  will need to be delivered this evening: Oxygen Package B -pt on O2 @2LNC  continuous; add over-bed table and AP&P mattress pad to go on existing bed *Contact daughter Victorino Dike c: [phone]; h: [phone] to arrange delivery  Patient's PCP: Marletta Lor NP with [facility] ; pharmacy [facility] Initial paperwork faxed to [facility]  Completed d/c summary will need to be faxed to [facility] @ [phone] when final Please notify [facility] when patient is ready to leave unit at d/c call [phone] (or [phone] if after 5 pm);  [facility] information and contact numbers also given to daughter Victorino Dike  during visit.   Above information shared with Darl Pikes [facility] who will contact [facility] representative to place DME request  Please call with any questions or concerns  Late entry Addendum:  Later in afternoon, writer did speak in person with daughter Victorino Dike when she arrived at hospital - questions answered - discussed that patient currently has foley catheter in place and family requests this be left in at discharge for comfort; this information was passed along to staff RN in room and to my colleague, [facility] Liaison, Chalmers Cater, RN who will follow up as needed tomorrow.   Valente David, RN 02/18/2013, 1:27 PM [facility] RN Liaison [phone]

## 2013-02-18 NOTE — Progress Notes (Signed)
Patient WU:[id number] Angelica Lawrence      DOB: 1926-06-23      YNW:[id number]   Palliative Medicine Team at [facility] Progress Note    Subjective:  Patient comfortable and conversant.  No family at the bedside.  Patient reports no pain.  She was able to eat some food.  Discussed case with hospice liaison.  Filed Vitals:   02/18/13 1300  BP: 99/68  Pulse: 77  Temp: 97.1 F (36.2 C)  Resp: 17   Physical exam:  General : no acute distress, slight periorbital edema PERRL, EOMI, anicteric, mmm Chest decreased CVS: regular rate S1, S2 ABd: soft, not tender Ext: warm upper, not mottled.  Lower ext. Bilateral amputations, right dressed, left clean and dry.     Assessment and plan: 79 yr old african [place] female admitted with sepsis. Source appears to be wound and UTI.  Intracable pain related to above and neuropathy.  Fentanyl patch started yesterday with responsible effect.  Only one bolus needed overnight. Roxanol now available for prn medication in preparation for going to home.  1.  DNR/ DNI  2.  Home with Hospice in the am  3.  Pain : continue Fentanyl and prn roxanol (decreased dosing to 10g)   4. Antibiotics to be discontinued at discharge.   Total time : 20 min   Angelica Docken L. Ladona Ridgel, MD MBA The Palliative Medicine Team at [facility] Phone: [phone] Pager: [phone]

## 2013-02-18 NOTE — Care Management Note (Signed)
02/18/13 2:38pm Vance Peper, RN BSN CM spoke with Delray Alt with [facility] [place]. Ordered hospice package B. Will speak with [facility] DME  liasion concerning getting equipment delivered to patient's home.

## 2013-02-18 NOTE — Care Management Note (Signed)
CARE MANAGEMENT NOTE 02/18/2013  Action/Plan:   CM spoke with patient's daughter [phone] to discuss choices for Home Hospice. [facility] of [facility] selected. CM contacted [facility] Liasion with referral.

## 2013-02-18 NOTE — Progress Notes (Signed)
Chart reviewed.  Discussed with CM, RN, and hospice RN.  Pt will go home with hospice tomorrow.  '   Assessment/Plan:  Principal Problem:   GI bleed Active Problems:   PERIPHERAL NEUROPATHY   HYPERTENSION   PERIPHERAL VASCULAR DISEASE   DM (diabetes mellitus)   Acute on chronic renal failure   DVT (deep venous thrombosis)   Hemorrhage of gastrointestinal tract, unspecified   Nonspecific abnormal finding in stool contents   Other specified gastritis without mention of hemorrhage   Shock UTI  Benadryl prn itch  Continue current for now, then home with comfort measures only tomorrow.  Subjective: C/o itching, abdomen. No pain or other complaints  Objective: Vital signs in last 24 hours: Temp:  [97.1 F (36.2 C)-98.4 F (36.9 C)] 97.1 F (36.2 C) (12/10 1300) Pulse Rate:  [77-112] 77 (12/10 1300) Resp:  [13-24] 17 (12/10 1300) BP: (96-118)/(43-77) 99/68 mmHg (12/10 1300) SpO2:  [100 %] 100 % (12/10 1300) Weight change:  Last BM Date: 02/17/13  Intake/Output from previous day: 12/09 0701 - 12/10 0700 In: 1490 [P.O.:240; I.V.:1150; IV Piggyback:100] Out: 250 [Urine:250] Intake/Output this shift: Total I/O In: 174.7 [I.V.:174.7] Out: -   Gen: alert, scratching abdomen.  Appropriate Lungs CTA CV RRR Abd: S, NT, ND Skin: no rash Ext: dressings  Lab Results:  Recent Labs  02/17/13 0425 02/18/13 0500  WBC 9.9 7.4  HGB 9.0* 8.3*  HCT 25.3* 23.6*  PLT 238 198   BMET  Recent Labs  02/17/13 0425 02/18/13 0420  NA 138 138  K 3.7 3.4*  CL 112 115*  CO2 17* 18*  GLUCOSE 138* 131*  BUN 23 21  CREATININE 1.49* 1.44*  CALCIUM 7.4* 7.5*    Studies/Results: No results found.  Medications:  Scheduled Meds: . collagenase   Topical Daily  . feeding supplement (ENSURE COMPLETE)  237 mL Oral TID BM  . fentaNYL  50 mcg Transdermal Q72H  . piperacillin-tazobactam (ZOSYN)  IV  3.375 g Intravenous Q8H  . sodium chloride  3 mL Intravenous Q12H  . vancomycin   750 mg Intravenous Q24H   Continuous Infusions: . dextrose 5 % and 0.45% NaCl 50 mL/hr at 02/16/13 1235   PRN Meds:.atropine, bisacodyl, morphine CONCENTRATE, ondansetron (ZOFRAN) IV, sodium chloride   LOS: 3 days   Marvel Mcphillips L 02/18/2013, 3:08 PM

## 2013-02-19 DIAGNOSIS — K2960 Other gastritis without bleeding: Secondary | ICD-10-CM

## 2013-02-19 LAB — WOUND CULTURE

## 2013-02-19 LAB — GLUCOSE, CAPILLARY: Glucose-Capillary: 168 mg/dL — ABNORMAL HIGH (ref 70–99)

## 2013-02-19 MED ORDER — FENTANYL 50 MCG/HR TD PT72: 50.0000 ug | MEDICATED_PATCH | TRANSDERMAL | Status: AC

## 2013-02-19 MED ORDER — FENTANYL CITRATE 0.05 MG/ML IJ SOLN
INTRAMUSCULAR | Status: AC
  Filled 2013-02-19: qty 2

## 2013-02-19 MED ORDER — TRAZODONE HCL 100 MG PO TABS: 100.0000 mg | ORAL_TABLET | Freq: Every evening | ORAL | Status: AC | PRN

## 2013-02-19 MED ORDER — FENTANYL CITRATE 0.05 MG/ML IJ SOLN
50.0000 ug | Freq: Once | INTRAMUSCULAR | Status: AC
  Administered 2013-02-19: 50 ug via INTRAVENOUS

## 2013-02-19 MED ORDER — MORPHINE SULFATE (CONCENTRATE) 10 MG /0.5 ML PO SOLN: 10.0000 mg | ORAL | Status: AC | PRN

## 2013-02-19 MED ORDER — ATROPINE SULFATE 1 % OP SOLN: 2.0000 [drp] | OPHTHALMIC | Status: AC | PRN

## 2013-02-19 MED ORDER — LORAZEPAM 2 MG/ML IJ SOLN: 1.0000 mg | Freq: Once | INTRAMUSCULAR | Status: DC

## 2013-02-19 MED ORDER — LORAZEPAM 2 MG/ML PO CONC: 1.0000 mg | Freq: Four times a day (QID) | ORAL | Status: AC | PRN

## 2013-02-19 NOTE — Progress Notes (Addendum)
Room 5N11 - Angelica Lawrence - [facility] RN Liaison Visit-R.Keria Widrig RN  Pt alert and converses, lying upright in bed,  denies any pain or discomfort.  No family present.   Pt scheduled to be discharged home today 12/11 with [facility] home care following in home.   Pt to discharge via non-emergent transport [facility].    PLEASE READ MD 'STICKY NOTE' ON CHART RE: DISCHARGE PRESCRIPTION INFORMATION ABOUT ROXANOL.  Please call [facility] @ [phone]- and advise when [facility] transport has arrived and ready to transport patient.  Thank you.  Joneen Boers, RN  [facility]  Hospice Liaison  [phone])

## 2013-02-19 NOTE — Discharge Summary (Signed)
Physician Discharge Summary  Angelica Lawrence ZOX:[id number] DOB: 03/16/1926 DOA: 02/15/2013  PCP: Marletta Lor, NP  Admit date: 02/15/2013 Discharge date: 02/19/2013  Time spent: greater than 30 minutes  Discharge Diagnoses:  Septic shock, present on admission UTI Right BKA wound infection   GI bleed Gastritis   PERIPHERAL NEUROPATHY   HYPERTENSION   PERIPHERAL VASCULAR DISEASE   DM (diabetes mellitus)   Acute on chronic renal failure   DVT (deep venous thrombosis)   Nonspecific abnormal finding in stool contents   Other specified gastritis without mention of hemorrhage Hypokalemia Warfarin induced coagulopathy  Discharge Condition: stable, home with hospice for end of life care/comfort measures only  Filed Weights   02/15/13 0534 02/15/13 1430 02/17/13 0400  Weight: 66.2 kg (145 lb 15.1 oz) 69.31 kg (152 lb 12.8 oz) 72.8 kg (160 lb 7.9 oz)    History of present illness:  79 y.o. female with Past medical history of coronary artery disease, hypertension, peripheral vascular disease, reduced to 180s status post bilateral amputation, diabetic, recent history of DVT after leg amputation, chronic kidney disease, chronic anemia.  The patient is coming from home.  The patient presents with complaints of bilateral stump pain. The history has been obtained from patient as well as patient's daughter. The patient herself does not complain of any shortness of breath, chest pain, nausea, vomiting, abdominal pain, diarrhea, burning urination. Her only complaint is bilateral stump pain which has been ongoing ever since the surgery. She was on pain medications at home but she ran out of the medications and since she could not go to the PCPs office she did not have a BP of the pain medication and that's why she was in a lot of pain.  I discussed the case with the daughter and she also complains about the same. The daughter mentioned the patient had black color bowel movement even before her prior  discharge but her hemoglobin remained consistently within the same range during the whole hospitalization. Also she did not mention any vomiting or fever.  INR on admission >5. hgb 9.  Hospital Course:  This is an 79 year old female with Left BKA in the past, PVD, diabetes mellitus, chronic kidney disease, right lower extremity osteomyelitis s/p recent right BKA in Nov and DVT of the left leg after the surgery for which she was on Coumadin. She was being treated for dehiscence of wound on right stump on an outpt basis.  She presented to the ER for pain in b/l stumps as she had ran out of her pain medications. In obtaining the history it was discovered that the patient had been having black BMs and, subsequently, was noted to be heme positive. Her INR was supratherapeutic at 8.57. It was reversed with Vit K and FFP and she was admitted for a GI work up. She was also noted to have AKI and was hypotensive. Unfortunately, she became hypotensive again on 12/7 and required pressor support in the ICU. She underwent an EGD on 12/7 which was essentially showed gastritis without bleeding. GI felt shock not related to bleeding.  Has uti and right BKA wound infection. Her "melena" resolved and her Hgb has been stable since. Started on vancomycin and zosyn on admission.  Palliative care medicine consulted for goals of care.  Family and patient have decided to transition to comfort care and withdrawal of all except comfort meds.  Procedures:  EGD  Left IJ central line  Consultations:  GI  Critical care  Palliative care  Discharge Exam: Filed Vitals:   02/19/13 0451  BP: 109/54  Pulse: 88  Temp: 97.8 F (36.6 C)  Resp: 16    General: asleep. Arousable. Comfortable, but confused Cardiovascular: RRR Respiratory: CTA  Discharge Instructions  Discharge Orders   Future Orders Complete By Expires   Diet general  As directed        Medication List    STOP taking these medications        atenolol 12.5 mg Tabs tablet  Commonly known as:  TENORMIN     collagenase ointment  Commonly known as:  SANTYL     [employer] IRON 325 (65 FE) MG tablet  Generic drug:  ferrous sulfate     HYDROcodone-acetaminophen 5-325 MG per tablet  Commonly known as:  NORCO/VICODIN     megestrol 400 MG/10ML suspension  Commonly known as:  MEGACE     multivitamin with minerals Tabs tablet     simvastatin 40 MG tablet  Commonly known as:  ZOCOR      TAKE these medications       atropine 1 % ophthalmic solution  Place 2 drops under the tongue every 2 (two) hours as needed (upper airway secretions).     bisacodyl 10 MG suppository  Commonly known as:  DULCOLAX  Place 1 suppository (10 mg total) rectally daily as needed for mild constipation or moderate constipation.     fentaNYL 50 MCG/HR  Commonly known as:  DURAGESIC - dosed mcg/hr  Place 1 patch (50 mcg total) onto the skin every 3 (three) days.     LORazepam 2 MG/ML concentrated solution  Commonly known as:  ATIVAN  Place 0.5 mLs (1 mg total) under the tongue every 6 (six) hours as needed for anxiety (agitation).     morphine CONCENTRATE 10 mg / 0.5 ml concentrated solution  Place 0.5-1 mLs (10-20 mg total) under the tongue every 2 (two) hours as needed for moderate pain, severe pain, anxiety or shortness of breath.     omeprazole 20 MG capsule  Commonly known as:  PRILOSEC  Take 1 capsule (20 mg total) by mouth daily.     polyethylene glycol packet  Commonly known as:  MIRALAX / GLYCOLAX  Take 17 g by mouth daily as needed for mild constipation.     traZODone 100 MG tablet  Commonly known as:  DESYREL  Take 1 tablet (100 mg total) by mouth at bedtime as needed for sleep.       No Known Allergies     Follow-up Information   Follow up with [facility] ([facility] to follow aftr d/c pls notify when pt ready to leave hospital call [phone] (or if after 5pm [phone]))    Contact information:    [facility] [address] [place], [place] [phone]       The results of significant diagnostics from this hospitalization (including imaging, microbiology, ancillary and laboratory) are listed below for reference.    Significant Diagnostic Studies: Ct Head Wo Contrast  01/31/2013   CLINICAL DATA:  Altered mental status and elevated INR level.  EXAM: CT HEAD WITHOUT CONTRAST  TECHNIQUE: Contiguous axial images were obtained from the base of the skull through the vertex without intravenous contrast.  COMPARISON:  None.  FINDINGS: The brain demonstrates mild periventricular small vessel ischemic changes and diffuse cortical atrophy. The brain demonstrates no evidence of hemorrhage, acute infarction, edema, mass effect, extra-axial fluid collection, hydrocephalus or mass lesion. The skull is unremarkable.  IMPRESSION:  No acute findings.   Electronically Signed   By: Irish Lack M.D.   On: 01/31/2013 08:44   Dg Pelvis Portable  02/01/2013   CLINICAL DATA:  No known injury.  Right pelvic pain.  EXAM: PORTABLE PELVIS 1-2 VIEWS  COMPARISON:  CT 09/20/2011  FINDINGS: Mild degenerative changes in the hips bilaterally. No fracture, subluxation or dislocation. Vascular calcifications noted.  IMPRESSION: No acute bony abnormality.   Electronically Signed   By: Charlett Nose M.D.   On: 02/01/2013 12:39   Dg Chest Port 1 View  02/15/2013   CLINICAL DATA:  Left jugular central venous catheter placement.  EXAM: PORTABLE CHEST - 1 VIEW  COMPARISON:  Earlier today.  FINDINGS: Interval left jugular catheter with its tip in the right atrium. No pneumothorax. Borderline enlarged cardiac silhouette with a mild increase in size. Increased prominence of the pulmonary vasculature and interstitial markings. Small right pleural effusion. Stable post CABG changes and thoracic spine degenerative changes. Right upper quadrant abdominal surgical clips. Old comminuted fracture of the left humeral head and neck and right  shoulder degenerative changes.  IMPRESSION: 1. Left jugular catheter tip in the right atrium. Retracting the catheter 3 cm would place it at the cavoatrial junction. 2. Interval borderline cardiomegaly and progressive changes of congestive heart failure.   Electronically Signed   By: Gordan Payment M.D.   On: 02/15/2013 17:00   Dg Chest Portable 1 View  02/15/2013   CLINICAL DATA:  Back pain.  EXAM: PORTABLE CHEST - 1 VIEW  COMPARISON:  01/30/2013  FINDINGS: Increased densities at the right lung base are suggestive for pleural fluid and possibly airspace disease. Prominent interstitial markings in the lower chest could represent dependent edema. Heart size is stable. Median sternotomy wires are present. Chronic changes in left shoulder.  IMPRESSION: Increased densities at the right lung base are concerning for pleural fluid and airspace disease/consolidation.  Prominent interstitial markings in lower chest could represent dependent edema.   Electronically Signed   By: Richarda Overlie M.D.   On: 02/15/2013 03:19   Dg Chest Port 1 View  01/30/2013   CLINICAL DATA:  Respiratory distress  EXAM: PORTABLE CHEST - 1 VIEW  COMPARISON:  December 16, 2012  FINDINGS: The heart size and mediastinal contours are stable. Patient is status post prior CABG and median sternotomy. There is a small right pleural effusion. There is no focal pneumonia or pulmonary edema. The visualized skeletal structures are stable.  IMPRESSION: Small right pleural effusion. No focal pneumonia or pulmonary edema.   Electronically Signed   By: Sherian Rein M.D.   On: 01/30/2013 19:07    Microbiology: Recent Results (from the past 240 hour(s))  URINE CULTURE     Status: None   Collection Time    02/15/13  8:42 AM      Result Value Range Status   Specimen Description URINE, CATHETERIZED   Final   Special Requests NONE   Final   Culture  Setup Time     Final   Value: 02/15/2013 18:22     Performed at [facility] Count      Final   Value: >=100,000 COLONIES/ML     Performed at [facility]   Culture     Final   Value: GRAM NEGATIVE RODS     Performed at [facility]   Report Status PENDING   Incomplete  WOUND CULTURE     Status: None   Collection Time  02/15/13 10:49 PM      Result Value Range Status   Specimen Description WOUND RIGHT LEG   Final   Special Requests Normal   Final   Gram Stain     Final   Value: FEW WBC PRESENT,BOTH PMN AND MONONUCLEAR     NO SQUAMOUS EPITHELIAL CELLS SEEN     RARE GRAM POSITIVE COCCI IN PAIRS     Performed at [facility]   Culture     Final   Value: MODERATE CITROBACTER KOSERI     MODERATE METHICILLIN RESISTANT STAPHYLOCOCCUS AUREUS     Note: RIFAMPIN AND GENTAMICIN SHOULD NOT BE USED AS SINGLE DRUGS FOR TREATMENT OF STAPH INFECTIONS. This organism DOES NOT demonstrate inducible Clindamycin resistance in vitro. CRITICAL RESULT CALLED TO, READ BACK BY AND VERIFIED WITH: R BLACKWELL 12/11      @940  BY REAMM     Performed at [facility]   Report Status 02/19/2013 FINAL   Final   Organism ID, Bacteria CITROBACTER KOSERI   Final   Organism ID, Bacteria METHICILLIN RESISTANT STAPHYLOCOCCUS AUREUS   Final  CULTURE, BLOOD (ROUTINE X 2)     Status: None   Collection Time    02/15/13 11:00 PM      Result Value Range Status   Specimen Description BLOOD RIGHT ARM   Final   Special Requests BOTTLES DRAWN AEROBIC ONLY 10CC   Final   Culture  Setup Time     Final   Value: 02/16/2013 08:54     Performed at [facility]   Culture     Final   Value:        BLOOD CULTURE RECEIVED NO GROWTH TO DATE CULTURE WILL BE HELD FOR 5 DAYS BEFORE ISSUING A FINAL NEGATIVE REPORT     Performed at [facility]   Report Status PENDING   Incomplete  CULTURE, BLOOD (ROUTINE X 2)     Status: None   Collection Time    02/15/13 11:10 PM      Result Value Range Status   Specimen Description BLOOD RIGHT ARM   Final   Special Requests BOTTLES DRAWN  AEROBIC ONLY 4CC   Final   Culture  Setup Time     Final   Value: 02/16/2013 08:54     Performed at [facility]   Culture     Final   Value:        BLOOD CULTURE RECEIVED NO GROWTH TO DATE CULTURE WILL BE HELD FOR 5 DAYS BEFORE ISSUING A FINAL NEGATIVE REPORT     Performed at [facility]   Report Status PENDING   Incomplete     Labs: Basic Metabolic Panel:  Recent Labs Lab 02/15/13 0045 02/16/13 0440 02/17/13 0425 02/18/13 0420  NA 132* 138 138 138  K 4.0 3.2* 3.7 3.4*  CL 103 111 112 115*  CO2 17* 17* 17* 18*  GLUCOSE 88 102* 138* 131*  BUN 32* 25* 23 21  CREATININE 1.87* 1.58* 1.49* 1.44*  CALCIUM 7.9* 7.3* 7.4* 7.5*   Liver Function Tests:  Recent Labs Lab 02/15/13 0045 02/16/13 0440  AST 46* 92*  ALT 21 31  ALKPHOS 125* 97  BILITOT 1.7* 2.5*  PROT 6.2 5.5*  ALBUMIN 1.7* 1.9*   No results found for this basename: LIPASE, AMYLASE,  in the last 168 hours No results found for this basename: AMMONIA,  in the last 168 hours CBC:  Recent Labs Lab 02/15/13 2310 02/16/13 0130  02/16/13 0440 02/17/13 0425 02/18/13 0500  WBC 10.8* 10.0 10.1 9.9 7.4  HGB 9.2* 8.8* 8.9* 9.0* 8.3*  HCT 26.0* 24.7* 24.9* 25.3* 23.6*  MCV 83.9 82.6 83.6 82.7 84.9  PLT 261 226 242 238 198   Cardiac Enzymes: No results found for this basename: CKTOTAL, CKMB, CKMBINDEX, TROPONINI,  in the last 168 hours BNP: BNP (last 3 results)  Recent Labs  10/16/12 2045 12/16/12 2006  PROBNP 1528.0* 18490.0*   CBG:  Recent Labs Lab 02/15/13 0759 02/15/13 1207 02/15/13 1430 02/15/13 1534  GLUCAP 98 95 76 69*      Signed:  Tiyonna Sardinha L  [facility] 02/19/2013, 10:30 AM

## 2013-02-19 NOTE — Progress Notes (Signed)
Patient discharged to home with hospice care. Patient left unit via EMS in a stable condition. Discharge instructions, DNR paperwork, and rx were given. Notified [facility]) of patients transfer to home. Notified patients daughter that patient is in route home.

## 2013-02-19 NOTE — Progress Notes (Signed)
NUTRITION FOLLOW-UP  DOCUMENTATION CODES Per approved criteria  -Obesity Unspecified   INTERVENTION:  Continue Ensure Complete PO TID, each supplement provides 350 kcal and 13 grams of protein.  NUTRITION DIAGNOSIS: Increased nutrient needs related to stump infection as evidenced by estimated nutrition needs; ongoing.   Goal: Intake to meet >90% of estimated nutrition needs; not met.  Monitor:  PO intake, labs, weight trend.   ASSESSMENT: 79 year old female w/ multiple co-morbids PVD, RLE osteo, DM, CKD, bilateral BKA, Most recently the right which was complicated by wound dehiscence being treated w/ santyl dressing changes. Just d/c'd from hospital on 11/28 after being admitted for dehydration and acute on chronic renal failure. Admitted to [facility] on 12/7 w/ CC; bilateral leg pain and dark stools. 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  PO intake remains very poor. Pt plans to d/c home with Hospice.  Height: Ht Readings from Last 1 Encounters:  02/15/13 5\' 5"  (1.651 m)    Weight: Wt Readings from Last 1 Encounters:  02/17/13 160 lb 7.9 oz (72.8 kg)    BMI:  30.2 (obesity, class 1)  Estimated Nutritional Needs: Kcal: 1700-1900 Protein: 100-115 gm Fluid: 1.7-1.9 L  Skin: stage II sacral pressure ulcer; Right leg BKA stump with dehiscence   Diet Order: General Meal Completion: 0%   Intake/Output Summary (Last 24 hours) at 02/19/13 0955 Last data filed at 02/19/13 0656  Gross per 24 hour  Intake 2008.33 ml  Output    800 ml  Net 1208.33 ml    Last BM: 12/9   Labs:   Recent Labs Lab 02/16/13 0440 02/17/13 0425 02/18/13 0420  NA 138 138 138  K 3.2* 3.7 3.4*  CL 111 112 115*  CO2 17* 17* 18*  BUN 25* 23 21  CREATININE 1.58* 1.49* 1.44*  CALCIUM 7.3* 7.4* 7.5*  GLUCOSE 102* 138* 131*    CBG (last 3)  No results found for this basename: GLUCAP,  in the last 72 hours  Scheduled Meds: . collagenase    Topical Daily  . feeding supplement (ENSURE COMPLETE)  237 mL Oral TID BM  . fentaNYL  50 mcg Transdermal Q72H  . fentaNYL      . LORazepam  1 mg Intravenous Once  . piperacillin-tazobactam (ZOSYN)  IV  3.375 g Intravenous Q8H  . sodium chloride  3 mL Intravenous Q12H  . vancomycin  750 mg Intravenous Q24H    Continuous Infusions: . dextrose 5 % and 0.45% NaCl 50 mL/hr at 02/19/13 0428   [facility] RD, LDN, CNSC [phone] Pager [phone] After Hours Pager

## 2013-02-20 LAB — URINE CULTURE

## 2013-02-22 LAB — CULTURE, BLOOD (ROUTINE X 2)

## 2013-03-12 DEATH — deceased

## 2013-04-10 NOTE — Progress Notes (Signed)
This encounter was created in error - please disregard.

## 2015-07-21 IMAGING — CR DG CHEST 2V
2 series · 2 of 2 positions shown · non-contrast
Comparison: 10/16/2012

CLINICAL DATA: Right below-knee amputation

EXAM:
CHEST  2 VIEW

[w chest lat]
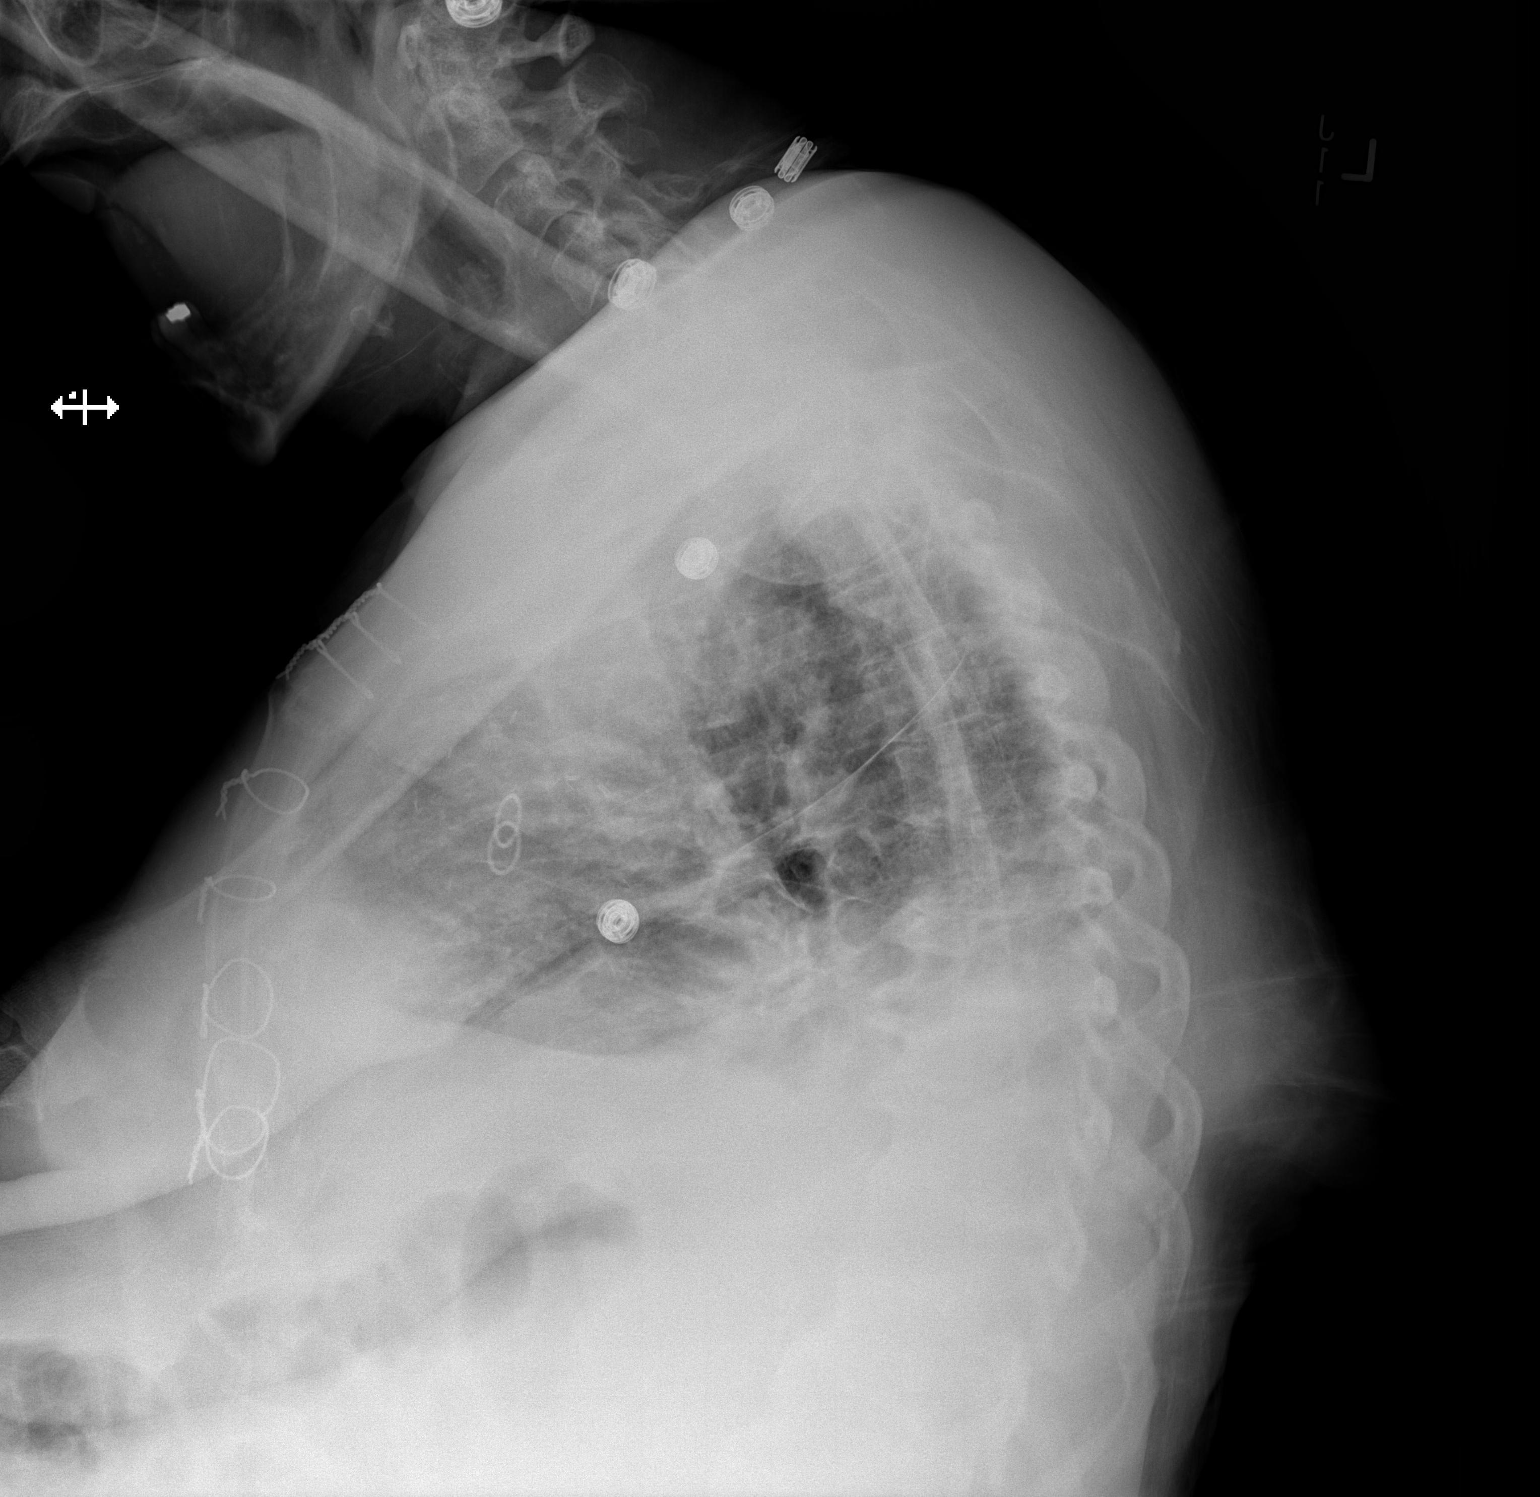

[x chest ap]
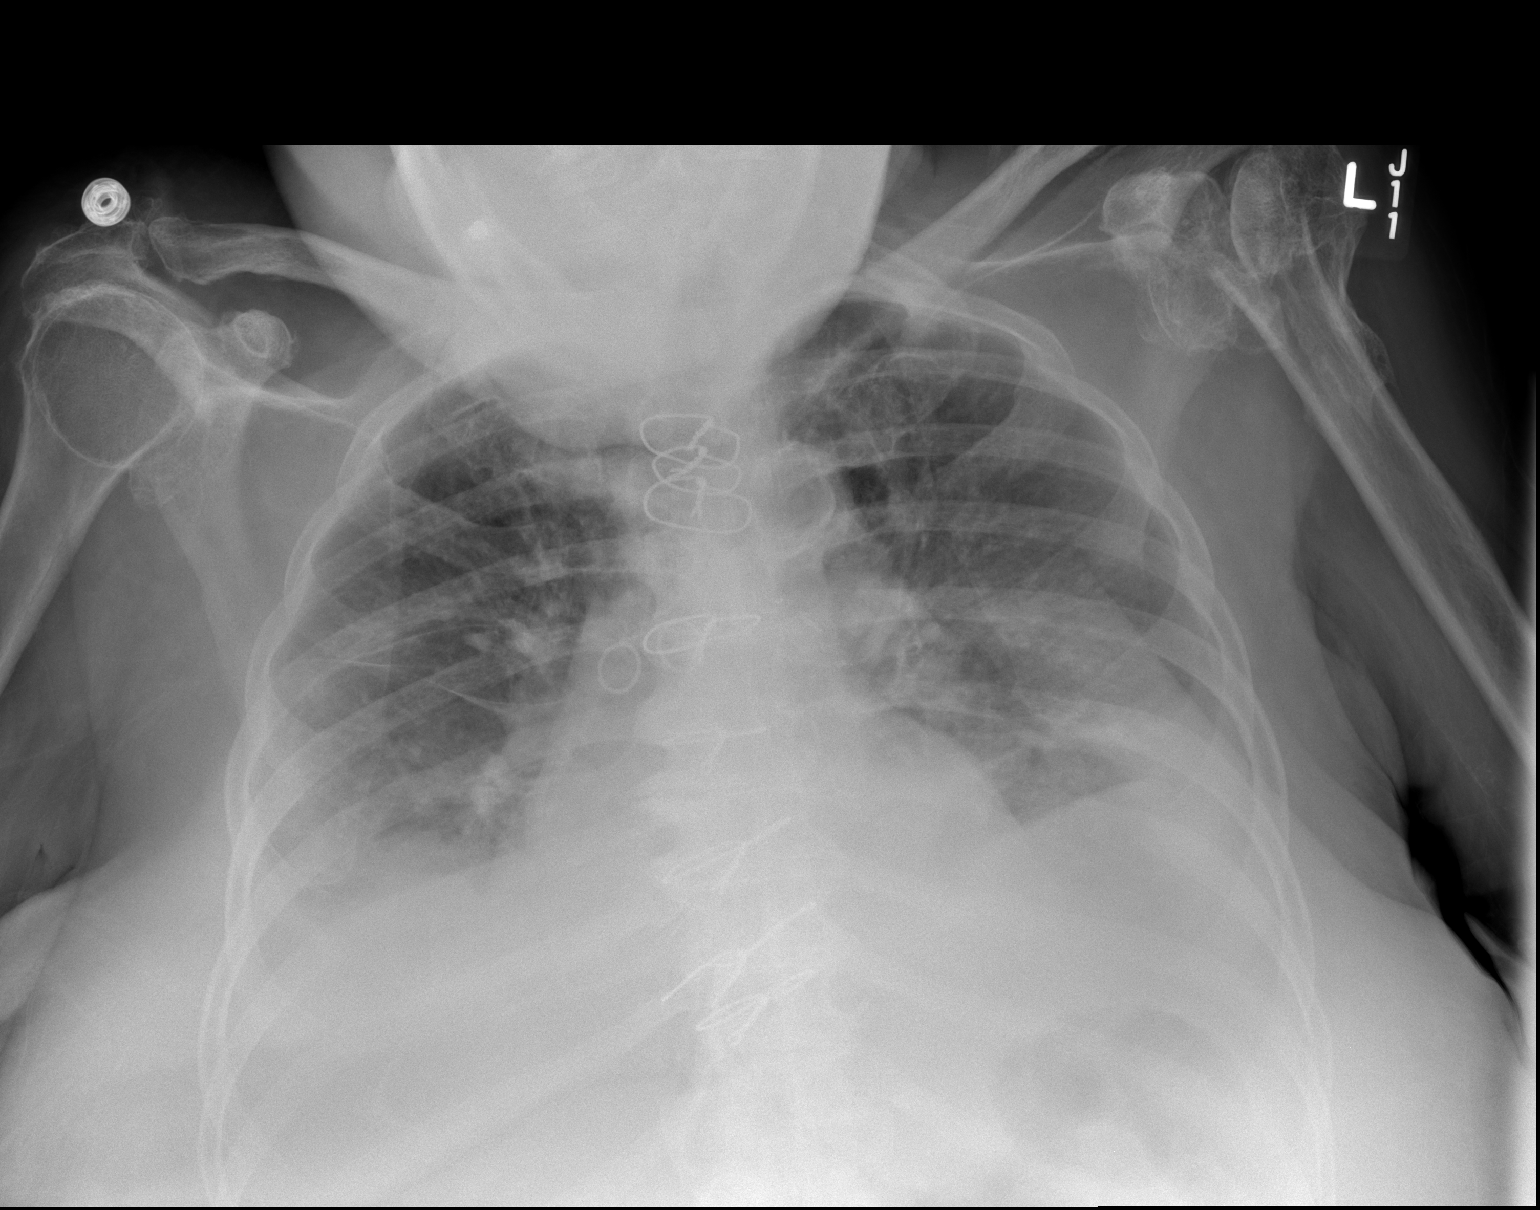

[2 of 2 positions shown; findings below may reference images not displayed]

FINDINGS: Low lung volumes with vascular crowding. Mild interstitial edema is
possible. Small bilateral pleural effusions, right greater than
left.

The heart is top-normal in size. Postsurgical changes related to
prior CABG.

Degenerative changes of the visualized thoracolumbar spine.
IMPRESSION: Possible mild interstitial edema.

Small bilateral pleural effusions, right greater than left.

## 2015-07-27 IMAGING — CR DG CHEST 1V PORT
1 series · 1 of 1 positions shown · non-contrast
Comparison: Prior chest x-ray 12/10/2012

CLINICAL DATA: Pulmonary edema

EXAM:
PORTABLE CHEST - 1 VIEW

[AP]
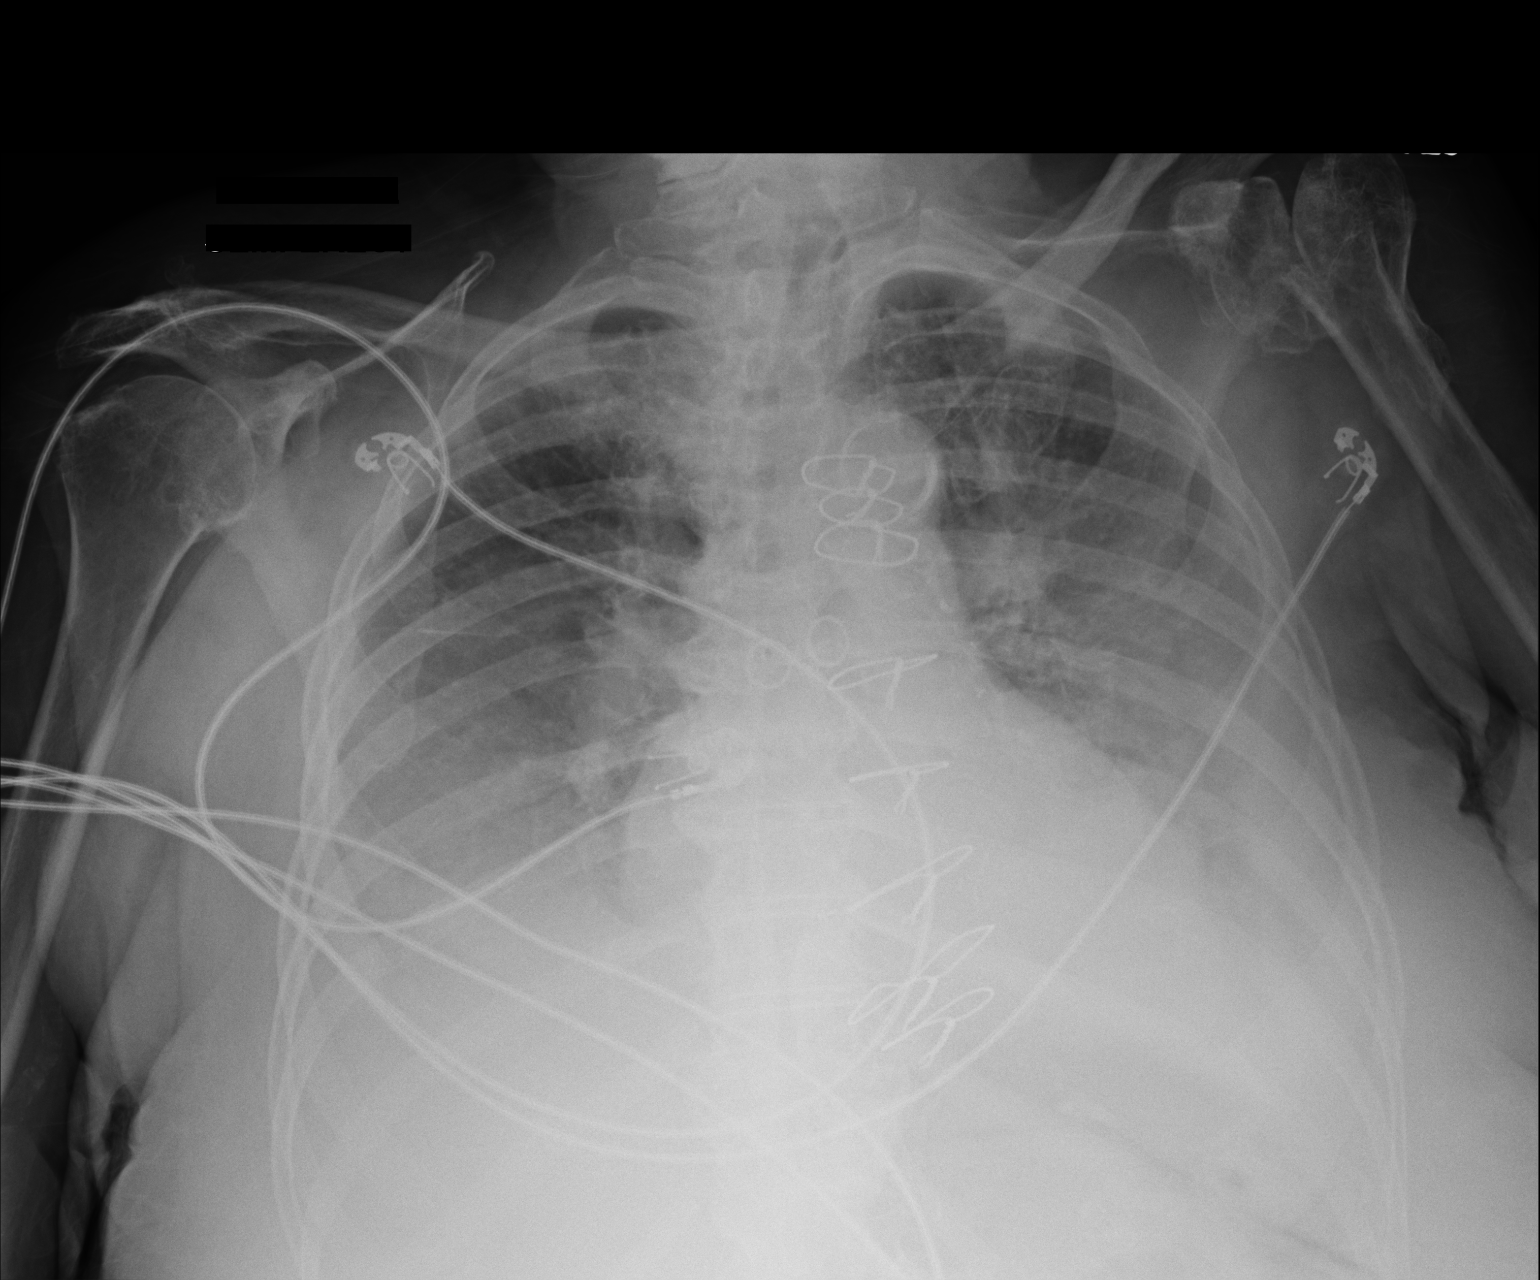

[1 of 1 positions shown; findings below may reference images not displayed]

FINDINGS: Increased pulmonary vascular congestion now with mild interstitial
edema. There are moderately large bilateral layering pleural
effusions with associated bibasilar atelectasis versus infiltrate.
Stable cardiomegaly and mediastinal contours. Patient is status post
median sternotomy with evidence of multivessel CABG including renal
bypass. Aortic atherosclerotic calcifications are noted. Stable
appearance of remote left humeral head and neck fracture. No acute
osseous abnormality.
IMPRESSION: 1. Mild -moderate CHF
2. Moderately large bilateral layering pleural effusions with
bibasilar atelectasis and versus infiltrates.
